# Patient Record
Sex: Female | Born: 1980
Health system: Southern US, Community
[De-identification: ages and names within clinical notes are randomized; demographics above are authoritative.]

## PROBLEM LIST (undated history)

## (undated) DIAGNOSIS — F419 Anxiety disorder, unspecified: Secondary | ICD-10-CM

## (undated) DIAGNOSIS — M545 Low back pain: Secondary | ICD-10-CM

## (undated) DIAGNOSIS — D649 Anemia, unspecified: Secondary | ICD-10-CM

## (undated) DIAGNOSIS — F339 Major depressive disorder, recurrent, unspecified: Secondary | ICD-10-CM

## (undated) DIAGNOSIS — G8929 Other chronic pain: Secondary | ICD-10-CM

## (undated) DIAGNOSIS — G2581 Restless legs syndrome: Secondary | ICD-10-CM

## (undated) DIAGNOSIS — K582 Mixed irritable bowel syndrome: Secondary | ICD-10-CM

## (undated) DIAGNOSIS — I7300 Raynaud's syndrome without gangrene: Secondary | ICD-10-CM

## (undated) DIAGNOSIS — G5621 Lesion of ulnar nerve, right upper limb: Secondary | ICD-10-CM

## (undated) DIAGNOSIS — R011 Cardiac murmur, unspecified: Secondary | ICD-10-CM

## (undated) HISTORY — DX: Other chronic pain: G89.29

## (undated) HISTORY — DX: Restless legs syndrome: G25.81

## (undated) HISTORY — DX: Low back pain: M54.5

## (undated) HISTORY — DX: Mixed irritable bowel syndrome: K58.2

## (undated) HISTORY — PX: TUBAL LIGATION: SHX77

## (undated) HISTORY — DX: Major depressive disorder, recurrent, unspecified: F33.9

## (undated) HISTORY — DX: Raynaud's syndrome without gangrene: I73.00

## (undated) HISTORY — DX: Anxiety disorder, unspecified: F41.9

## (undated) HISTORY — DX: Lesion of ulnar nerve, right upper limb: G56.21

## (undated) HISTORY — DX: Anemia, unspecified: D64.9

## (undated) HISTORY — DX: Cardiac murmur, unspecified: R01.1

---

## 2015-11-21 DIAGNOSIS — L700 Acne vulgaris: Secondary | ICD-10-CM | POA: Insufficient documentation

## 2016-02-16 ENCOUNTER — Ambulatory Visit (INDEPENDENT_AMBULATORY_CARE_PROVIDER_SITE_OTHER): Payer: 59 | Admitting: Physician Assistant

## 2016-02-16 ENCOUNTER — Ambulatory Visit (INDEPENDENT_AMBULATORY_CARE_PROVIDER_SITE_OTHER): Payer: 59

## 2016-02-16 ENCOUNTER — Encounter: Payer: Self-pay | Admitting: Physician Assistant

## 2016-02-16 VITALS — BP 99/80 | HR 83 | Ht 64.25 in | Wt 152.0 lb

## 2016-02-16 DIAGNOSIS — M25561 Pain in right knee: Secondary | ICD-10-CM

## 2016-02-16 DIAGNOSIS — M791 Myalgia: Secondary | ICD-10-CM

## 2016-02-16 DIAGNOSIS — M79605 Pain in left leg: Secondary | ICD-10-CM

## 2016-02-16 DIAGNOSIS — R5383 Other fatigue: Secondary | ICD-10-CM

## 2016-02-16 DIAGNOSIS — M79604 Pain in right leg: Secondary | ICD-10-CM | POA: Diagnosis not present

## 2016-02-16 DIAGNOSIS — G8929 Other chronic pain: Secondary | ICD-10-CM

## 2016-02-16 DIAGNOSIS — IMO0001 Reserved for inherently not codable concepts without codable children: Secondary | ICD-10-CM

## 2016-02-16 DIAGNOSIS — M609 Myositis, unspecified: Secondary | ICD-10-CM

## 2016-02-20 ENCOUNTER — Encounter: Payer: Self-pay | Admitting: Physician Assistant

## 2016-02-20 DIAGNOSIS — M79605 Pain in left leg: Secondary | ICD-10-CM

## 2016-02-20 DIAGNOSIS — R5383 Other fatigue: Secondary | ICD-10-CM | POA: Diagnosis not present

## 2016-02-20 DIAGNOSIS — IMO0001 Reserved for inherently not codable concepts without codable children: Secondary | ICD-10-CM | POA: Insufficient documentation

## 2016-02-20 DIAGNOSIS — M609 Myositis, unspecified: Secondary | ICD-10-CM | POA: Diagnosis not present

## 2016-02-20 DIAGNOSIS — G8929 Other chronic pain: Secondary | ICD-10-CM | POA: Insufficient documentation

## 2016-02-20 DIAGNOSIS — M79604 Pain in right leg: Secondary | ICD-10-CM | POA: Insufficient documentation

## 2016-02-20 DIAGNOSIS — M25561 Pain in right knee: Secondary | ICD-10-CM | POA: Insufficient documentation

## 2016-02-20 DIAGNOSIS — M791 Myalgia: Secondary | ICD-10-CM | POA: Diagnosis not present

## 2016-02-20 LAB — CBC
HCT: 39.4 % (ref 35.0–45.0)
Hemoglobin: 12.5 g/dL (ref 11.7–15.5)
MCH: 29.1 pg (ref 27.0–33.0)
MCHC: 31.7 g/dL — ABNORMAL LOW (ref 32.0–36.0)
MCV: 91.6 fL (ref 80.0–100.0)
MPV: 10.5 fL (ref 7.5–12.5)
PLATELETS: 238 10*3/uL (ref 140–400)
RBC: 4.3 MIL/uL (ref 3.80–5.10)
RDW: 13.6 % (ref 11.0–15.0)
WBC: 6.3 10*3/uL (ref 3.8–10.8)

## 2016-02-20 NOTE — Progress Notes (Signed)
   Subjective:    Patient ID: Teresa BorneMichelle Garcia, female    DOB: 1981/02/18, 35 y.o.   MRN: [id number]  HPI Patient is a 35 year old female who presents to the clinic to establish care.  Patient does have a past medical history positive for lethargy and bilateral lower extremity pain. Per patient she has had a workup done before and everything was negative. She has good and bad days. Some days she has little to no pain and other days her legs ache from the bilateral hips down. Some days she feels like her muscles are shearing part. She does feel like she has more pain over the right knee. This inhibits her from exercising regularly. She's not tried anything to help this pain she does not wear a knee brace. Does seem to be worse when she is walking up steps and attempting to run or jog. At times she feels very stiff. Her mother has fibromyalgia and she is concerned she could have this also. She admits she does always feels like she has no energy. She sleeps fairly well at night.  .. Social History   Social History  . Marital status: Significant Other    Spouse name: N/A  . Number of children: N/A  . Years of education: N/A   Occupational History  . Not on file.   Social History Main Topics  . Smoking status: Former Games developermoker  . Smokeless tobacco: Never Used  . Alcohol use Yes  . Drug use: No  . Sexual activity: Yes   Other Topics Concern  . Not on file   Social History Narrative  . No narrative on file      Review of Systems  All other systems reviewed and are negative.      Objective:   Physical Exam  Constitutional: She is oriented to person, place, and time. She appears well-developed and well-nourished.  HENT:  Head: Normocephalic and atraumatic.  Cardiovascular: Normal rate, regular rhythm and normal heart sounds.   Pulmonary/Chest: Effort normal and breath sounds normal.  Musculoskeletal:  NROM of right knee.  5/5 strength of bilateral lower extremities.  No joint  tenderness.  Negative anterior drawer sign.   Neurological: She is alert and oriented to person, place, and time.  Psychiatric: She has a normal mood and affect. Her behavior is normal.          Assessment & Plan:  No energy/bilateral leg pain/myalgia- unclear etiology. Has been suspected she has fibromyalgia. Her mother has been diagnosed with fibromyalgia. She was started on Cymbalta in the past and she did not feel like it helped and seem to make symptoms worse. She denies any ongoing depression or anxiety.   Right knee pain- will get xray to evaluate. Xray was essentially normal. Did appear to have a bone spur over posterior patella. 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 HO of work outs given. Discussed ibuprofen and PT. She will consider. Work out with sleeve. If no improvement see sports med or consider MRI.

## 2016-02-21 ENCOUNTER — Ambulatory Visit: Payer: 59 | Admitting: Family Medicine

## 2016-02-21 ENCOUNTER — Encounter: Payer: Self-pay | Admitting: Physician Assistant

## 2016-02-21 DIAGNOSIS — R790 Abnormal level of blood mineral: Secondary | ICD-10-CM | POA: Insufficient documentation

## 2016-02-21 LAB — COMPREHENSIVE METABOLIC PANEL
ALK PHOS: 38 U/L (ref 33–115)
ALT: 14 U/L (ref 6–29)
AST: 16 U/L (ref 10–30)
Albumin: 4.1 g/dL (ref 3.6–5.1)
BILIRUBIN TOTAL: 0.6 mg/dL (ref 0.2–1.2)
BUN: 12 mg/dL (ref 7–25)
CHLORIDE: 106 mmol/L (ref 98–110)
CO2: 25 mmol/L (ref 20–31)
CREATININE: 1.01 mg/dL (ref 0.50–1.10)
Calcium: 8.6 mg/dL (ref 8.6–10.2)
Glucose, Bld: 84 mg/dL (ref 65–99)
Potassium: 4 mmol/L (ref 3.5–5.3)
SODIUM: 139 mmol/L (ref 135–146)
TOTAL PROTEIN: 7.2 g/dL (ref 6.1–8.1)

## 2016-02-21 LAB — FERRITIN: Ferritin: 15 ng/mL (ref 10–154)

## 2016-02-21 LAB — ANA: Anti Nuclear Antibody(ANA): NEGATIVE

## 2016-02-21 LAB — TSH: TSH: 1.47 mIU/L

## 2016-02-21 LAB — VITAMIN B12: VITAMIN B 12: 702 pg/mL (ref 200–1100)

## 2016-02-21 LAB — VITAMIN D 25 HYDROXY (VIT D DEFICIENCY, FRACTURES): Vit D, 25-Hydroxy: 32 ng/mL (ref 30–100)

## 2016-02-21 LAB — SEDIMENTATION RATE: Sed Rate: 6 mm/hr (ref 0–20)

## 2016-02-21 LAB — C-REACTIVE PROTEIN

## 2016-03-13 ENCOUNTER — Ambulatory Visit (INDEPENDENT_AMBULATORY_CARE_PROVIDER_SITE_OTHER): Payer: 59 | Admitting: Physician Assistant

## 2016-03-13 VITALS — BP 104/64 | HR 79 | Wt 157.0 lb

## 2016-03-13 DIAGNOSIS — R635 Abnormal weight gain: Secondary | ICD-10-CM | POA: Diagnosis not present

## 2016-03-13 MED ORDER — PHENTERMINE HCL 37.5 MG PO CAPS: 37.5000 mg | ORAL_CAPSULE | ORAL | 0 refills | Status: DC

## 2016-03-13 NOTE — Progress Notes (Signed)
Patient came into clinic today for initial weight check. Pt would like to begin on Phentermine. Vitals recorded and will route to PCP for approval on Rx. Pharmacy correct in chart.    Discussed with patient in office 150 minutes of exercise a week and calorie limitation to 1500 a day. Discussed side effects of medication. Follow up in one month. Tandy GawJade Breeback PA-C

## 2016-03-18 ENCOUNTER — Encounter: Payer: 59 | Admitting: Physician Assistant

## 2016-03-22 ENCOUNTER — Ambulatory Visit (INDEPENDENT_AMBULATORY_CARE_PROVIDER_SITE_OTHER): Payer: 59 | Admitting: Physician Assistant

## 2016-03-22 ENCOUNTER — Encounter: Payer: Self-pay | Admitting: Physician Assistant

## 2016-03-22 VITALS — BP 119/73 | HR 94 | Ht 64.25 in | Wt 151.0 lb

## 2016-03-22 DIAGNOSIS — Z0000 Encounter for general adult medical examination without abnormal findings: Secondary | ICD-10-CM | POA: Diagnosis not present

## 2016-03-22 DIAGNOSIS — Z720 Tobacco use: Secondary | ICD-10-CM

## 2016-03-22 DIAGNOSIS — Z716 Tobacco abuse counseling: Secondary | ICD-10-CM | POA: Diagnosis not present

## 2016-03-22 DIAGNOSIS — Z131 Encounter for screening for diabetes mellitus: Secondary | ICD-10-CM | POA: Diagnosis not present

## 2016-03-22 DIAGNOSIS — R635 Abnormal weight gain: Secondary | ICD-10-CM

## 2016-03-22 MED ORDER — PHENTERMINE HCL 37.5 MG PO TABS
37.5000 mg | ORAL_TABLET | Freq: Every day | ORAL | 0 refills | Status: DC
Start: 2016-03-22 — End: 2016-04-25

## 2016-03-22 MED ORDER — BUPROPION HCL ER (SR) 150 MG PO TB12: 150.0000 mg | ORAL_TABLET | Freq: Two times a day (BID) | ORAL | 1 refills | Status: DC

## 2016-03-22 MED FILL — BUPROPION HCL SR 150 MG TAB: 150 | 30 days supply | Qty: 60 | Fill #0

## 2016-03-22 MED FILL — PHENTERMINE 37.5 MG TABLET: 37.5 | 30 days supply | Qty: 30 | Fill #0

## 2016-03-22 NOTE — Patient Instructions (Signed)

## 2016-03-24 NOTE — Progress Notes (Signed)
Subjective:     Teresa Garcia is a 35 y.o. female and is here for a comprehensive physical exam. The patient reports problems - she started phentermine but does not feel like it is helping curve her appetitie like it should. she is walking a few times a week. down 7lbs. .  Social History   Social History  . Marital status: Significant Other    Spouse name: N/A  . Number of children: N/A  . Years of education: N/A   Occupational History  . Not on file.   Social History Main Topics  . Smoking status: Current Some Day Smoker  . Smokeless tobacco: Never Used  . Alcohol use Yes  . Drug use: No  . Sexual activity: Yes   Other Topics Concern  . Not on file   Social History Narrative  . No narrative on file   Health Maintenance  Topic Date Due  . HIV Screening  01/03/1997  . TETANUS/TDAP  01/03/2001  . PAP SMEAR  01/04/2003  . INFLUENZA VACCINE  02/15/2017 (Originally 01/16/2016)    The following portions of the patient's history were reviewed and updated as appropriate: allergies, current medications, past family history, past medical history, past social history, past surgical history and problem list.  Review of Systems Pertinent items noted in HPI and remainder of comprehensive ROS otherwise negative.   Objective:    BP 119/73   Pulse 94   Ht 5' 4.25" (1.632 m)   Wt 151 lb (68.5 kg)   BMI 25.72 kg/m  General appearance: alert, cooperative and appears stated age Head: Normocephalic, without obvious abnormality, atraumatic Eyes: conjunctivae/corneas clear. PERRL, EOM's intact. Fundi benign. Ears: normal TM's and external ear canals both ears Nose: Nares normal. Septum midline. Mucosa normal. No drainage or sinus tenderness. Throat: lips, mucosa, and tongue normal; teeth and gums normal Neck: no adenopathy, no carotid bruit, no JVD, supple, symmetrical, trachea midline and thyroid not enlarged, symmetric, no tenderness/mass/nodules Back: symmetric, no curvature. ROM  normal. No CVA tenderness. Lungs: clear to auscultation bilaterally Heart: regular rate and rhythm, S1, S2 normal, no murmur, click, rub or gallop Abdomen: soft, non-tender; bowel sounds normal; no masses,  no organomegaly Extremities: extremities normal, atraumatic, no cyanosis or edema Pulses: 2+ and symmetric Skin: Skin color, texture, turgor normal. No rashes or lesions Lymph nodes: Cervical, supraclavicular, and axillary nodes normal. Neurologic: Alert and oriented X 3, normal strength and tone. Normal symmetric reflexes. Normal coordination and gait    Assessment:    Healthy female exam.      Plan:    Marland Kitchen.Marland Kitchen.Teresa Garcia was seen today for annual exam.  Diagnoses and all orders for this visit:  Routine physical examination  Screening for diabetes mellitus -     Lipid panel  Abnormal weight gain  Other orders -     phentermine (ADIPEX-P) 37.5 MG tablet; Take 1 tablet (37.5 mg total) by mouth daily before breakfast. -     buPROPion (WELLBUTRIN SR) 150 MG 12 hr tablet; Take 1 tablet (150 mg total) by mouth 2 (two) times daily.  CPE- encouraged vitamin d 800 units and calcium 1500mg  daily.  Switched from capsule to tablet.    Smoking cessation- Wellbutrin started.   See After Visit Summary for Counseling Recommendations

## 2016-03-29 ENCOUNTER — Ambulatory Visit (INDEPENDENT_AMBULATORY_CARE_PROVIDER_SITE_OTHER): Payer: 59 | Admitting: Sports Medicine

## 2016-03-29 ENCOUNTER — Ambulatory Visit (INDEPENDENT_AMBULATORY_CARE_PROVIDER_SITE_OTHER): Payer: 59

## 2016-03-29 DIAGNOSIS — R51 Headache: Secondary | ICD-10-CM | POA: Diagnosis not present

## 2016-03-29 DIAGNOSIS — M62838 Other muscle spasm: Secondary | ICD-10-CM | POA: Diagnosis not present

## 2016-03-29 DIAGNOSIS — M542 Cervicalgia: Secondary | ICD-10-CM | POA: Diagnosis not present

## 2016-03-29 MED ORDER — CYCLOBENZAPRINE HCL 10 MG PO TABS: ORAL_TABLET | ORAL | 0 refills | Status: DC

## 2016-03-29 MED ORDER — MELOXICAM 15 MG PO TABS: ORAL_TABLET | ORAL | 3 refills | Status: DC

## 2016-03-29 MED ORDER — PREDNISONE 50 MG PO TABS: ORAL_TABLET | ORAL | 0 refills | Status: DC

## 2016-03-29 MED FILL — predniSONE 50 MG TABS: 50 | 5 days supply | Qty: 5 | Fill #0

## 2016-03-29 MED FILL — CYCLOBENZAPRINE 10 MG TAB: 10 | 10 days supply | Qty: 30 | Fill #0

## 2016-03-29 MED FILL — MELOXICAM 15 MG TABLET: 15 | 30 days supply | Qty: 30 | Fill #0

## 2016-03-29 NOTE — Assessment & Plan Note (Signed)
Meloxicam, prednisone, Flexeril at bedtime. New set of x-rays, rehabilitation exercises given. Return to see me in one month.

## 2016-03-29 NOTE — Progress Notes (Signed)
   Subjective:    I'm seeing this patient as a consultation for:  Teresa GawJade Breeback, PA-C  CC: Neck pain  HPI: This is a pleasant [age] year old female, for years she's had on and off neck pain, initially localized to the left paracervical muscles with an x-ray that showed loss of the normal cervical lordosis. Initially it did go down her left arm. This resolved, unfortunately she is having a recurrence of pain, localized at the base of the neck with radiation over to the top of the head. No overt radicular symptoms down the arm, no weakness, no trauma, constitutional symptoms.  Past medical history:  Negative.  See flowsheet/record as well for more information.  Surgical history: Negative.  See flowsheet/record as well for more information.  Family history: Negative.  See flowsheet/record as well for more information.  Social history: Negative.  See flowsheet/record as well for more information.  Allergies, and medications have been entered into the medical record, reviewed, and no changes needed.   Review of Systems: No headache, visual changes, nausea, vomiting, diarrhea, constipation, dizziness, abdominal pain, skin rash, fevers, chills, night sweats, weight loss, swollen lymph nodes, body aches, joint swelling, muscle aches, chest pain, shortness of breath, mood changes, visual or auditory hallucinations.   Objective:   General: Well Developed, well nourished, and in no acute distress.  Neuro/Psych: Alert and oriented x3, extra-ocular muscles intact, able to move all 4 extremities, sensation grossly intact. Skin: Warm and dry, no rashes noted.  Respiratory: Not using accessory muscles, speaking in full sentences, trachea midline.  Cardiovascular: Pulses palpable, no extremity edema. Abdomen: Does not appear distended. Neck: Negative spurling's Full neck range of motion Grip strength and sensation normal in bilateral hands Strength good C4 to T1 distribution No sensory change to C4 to  T1 Reflexes normal  Impression and Recommendations:   This case required medical decision making of moderate complexity.  Cervical paraspinal muscle spasm Meloxicam, prednisone, Flexeril at bedtime. New set of x-rays, rehabilitation exercises given. Return to see me in one month.

## 2016-04-11 ENCOUNTER — Other Ambulatory Visit: Payer: Self-pay | Admitting: *Deleted

## 2016-04-25 ENCOUNTER — Ambulatory Visit (INDEPENDENT_AMBULATORY_CARE_PROVIDER_SITE_OTHER): Payer: 59 | Admitting: Family Medicine

## 2016-04-25 VITALS — BP 117/68 | HR 92 | Ht 64.25 in | Wt 147.0 lb

## 2016-04-25 DIAGNOSIS — R635 Abnormal weight gain: Secondary | ICD-10-CM

## 2016-04-25 MED ORDER — PHENTERMINE HCL 37.5 MG PO TABS: 37.5000 mg | ORAL_TABLET | Freq: Every day | ORAL | 0 refills | Status: DC

## 2016-04-25 MED FILL — PHENTERMINE 37.5 MG TABLET: 37.5 | 30 days supply | Qty: 30 | Fill #0

## 2016-04-25 NOTE — Progress Notes (Signed)
Fantastic! She ahs been going to exercise classes.   Will refill medication. Follow-up in one month. She may need to start tapering after next month as she will likely be at normal BMI.  Nani Gasseratherine Metheney, MD

## 2016-04-25 NOTE — Progress Notes (Signed)
Weight and BP checked today.  Pt denies chest pain, palpitations, or trouble sleeping.  She's lost 4lbs per her last visit with Jade.

## 2016-05-02 ENCOUNTER — Ambulatory Visit (INDEPENDENT_AMBULATORY_CARE_PROVIDER_SITE_OTHER): Payer: 59 | Admitting: Obstetrics & Gynecology

## 2016-05-02 ENCOUNTER — Encounter: Payer: Self-pay | Admitting: Obstetrics & Gynecology

## 2016-05-02 VITALS — BP 116/82 | HR 96 | Ht 64.0 in | Wt 144.0 lb

## 2016-05-02 DIAGNOSIS — Z1151 Encounter for screening for human papillomavirus (HPV): Secondary | ICD-10-CM | POA: Diagnosis not present

## 2016-05-02 DIAGNOSIS — Z113 Encounter for screening for infections with a predominantly sexual mode of transmission: Secondary | ICD-10-CM | POA: Diagnosis not present

## 2016-05-02 DIAGNOSIS — Z124 Encounter for screening for malignant neoplasm of cervix: Secondary | ICD-10-CM

## 2016-05-02 DIAGNOSIS — Z01419 Encounter for gynecological examination (general) (routine) without abnormal findings: Secondary | ICD-10-CM | POA: Diagnosis not present

## 2016-05-02 DIAGNOSIS — N898 Other specified noninflammatory disorders of vagina: Secondary | ICD-10-CM

## 2016-05-02 NOTE — Progress Notes (Signed)
Subjective:    Teresa BorneMichelle Garcia is a 35 y.o. DW P3 (15, 3313, and 788 yo kids)  female who presents for an annual exam. The patient has no complaints today. She thinks she is having a recurrence of BV.  She tried Monistat 1.  The patient is sexually active. GYN screening history: last pap: was normal. The patient wears seatbelts: yes. The patient participates in regular exercise: yes. Has the patient ever been transfused or tattooed?: yes. The patient reports that there is not domestic violence in her life.   Menstrual History: OB History    Gravida Para Term Preterm AB Living   3 3 3     3    SAB TAB Ectopic Multiple Live Births                  Menarche age: [age] Patient's last menstrual period was 04/22/2016.    The following portions of the patient's history were reviewed and updated as appropriate: allergies, current medications, past family history, past medical history, past social history, past surgical history and problem list.  Review of Systems Pertinent items are noted in HPI.   Works for [employer] Has had flu vaccine Monogamous for about 18 months FH- no breast/gyn/colon cancer Didn't get Gardasil Had a BTL  Periods are regular, Some PCB at times Objective:    BP 116/82   Pulse 96   Ht 5\' 4"  (1.626 m)   Wt 144 lb (65.3 kg)   LMP 04/22/2016   BMI 24.72 kg/m   General Appearance:    Alert, cooperative, no distress, appears stated age  Head:    Normocephalic, without obvious abnormality, atraumatic  Eyes:    PERRL, conjunctiva/corneas clear, EOM's intact, fundi    benign, both eyes  Ears:    Normal TM's and external ear canals, both ears  Nose:   Nares normal, septum midline, mucosa normal, no drainage    or sinus tenderness  Throat:   Lips, mucosa, and tongue normal; teeth and gums normal  Neck:   Supple, symmetrical, trachea midline, no adenopathy;    thyroid:  no enlargement/tenderness/nodules; no carotid   bruit or JVD  Back:     Symmetric, no curvature, ROM normal, no  CVA tenderness  Lungs:     Clear to auscultation bilaterally, respirations unlabored  Chest Wall:    No tenderness or deformity   Heart:    Regular rate and rhythm, S1 and S2 normal, no murmur, rub   or gallop  Breast Exam:    No tenderness, masses, or nipple abnormality  Abdomen:     Soft, non-tender, bowel sounds active all four quadrants,    no masses, no organomegaly  Genitalia:    Normal female without lesion, discharge or tenderness, small amount of menstrual blood but otherwise absolutely no discharge or odor, NSSmidplane, NT, normal adnexal exam     Extremities:   Extremities normal, atraumatic, no cyanosis or edema  Pulses:   2+ and symmetric all extremities  Skin:   Skin color, texture, turgor normal, no rashes or lesions  Lymph nodes:   Cervical, supraclavicular, and axillary nodes normal  Neurologic:   CNII-XII intact, normal strength, sensation and reflexes    throughout   .    Assessment:    Healthy female exam.   Vaginal discharge   Plan:     Thin prep Pap smear. with cotesting STI testing Wet prep sent

## 2016-05-02 NOTE — Addendum Note (Signed)
Addended by: Granville LewisLARK, Ferd Horrigan L on: 05/02/2016 03:44 PM   Modules accepted: Orders

## 2016-05-03 ENCOUNTER — Telehealth: Payer: Self-pay

## 2016-05-03 LAB — RPR

## 2016-05-03 LAB — WET PREP FOR TRICH, YEAST, CLUE
Trich, Wet Prep: NONE SEEN
Yeast Wet Prep HPF POC: NONE SEEN

## 2016-05-03 LAB — HEPATITIS B SURFACE ANTIGEN: HEP B S AG: NEGATIVE

## 2016-05-03 LAB — HEPATITIS C ANTIBODY: HCV AB: NEGATIVE

## 2016-05-03 LAB — HIV ANTIBODY (ROUTINE TESTING W REFLEX): HIV: NONREACTIVE

## 2016-05-03 MED ORDER — METRONIDAZOLE 500 MG PO TABS: 500.0000 mg | ORAL_TABLET | Freq: Two times a day (BID) | ORAL | 0 refills | Status: DC

## 2016-05-03 NOTE — Telephone Encounter (Signed)
Spoke with pt and she is aware that she has BV and medication has been sent to her pharmacy

## 2016-05-06 LAB — GC/CHLAMYDIA PROBE AMP (~~LOC~~) NOT AT ARMC
Chlamydia: NEGATIVE
NEISSERIA GONORRHEA: NEGATIVE

## 2016-05-07 LAB — CYTOLOGY - PAP
Diagnosis: NEGATIVE
HPV: DETECTED — AB

## 2016-05-14 ENCOUNTER — Telehealth: Payer: Self-pay | Admitting: *Deleted

## 2016-05-14 NOTE — Telephone Encounter (Signed)
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

## 2016-05-17 ENCOUNTER — Telehealth: Payer: Self-pay | Admitting: *Deleted

## 2016-05-17 DIAGNOSIS — B379 Candidiasis, unspecified: Secondary | ICD-10-CM

## 2016-05-17 MED ORDER — FLUCONAZOLE 150 MG PO TABS: ORAL_TABLET | ORAL | 0 refills | Status: DC

## 2016-05-17 NOTE — Telephone Encounter (Signed)
Pt called stating that she just finished her Flagyl and now has d/c and vaginal itching.  Requesting Diflucan be sent to her pharmacy.  Per Dr Marice Potterove OK to send Diflucan

## 2016-06-03 ENCOUNTER — Other Ambulatory Visit: Payer: Self-pay | Admitting: Physician Assistant

## 2016-06-03 DIAGNOSIS — L700 Acne vulgaris: Secondary | ICD-10-CM

## 2016-06-03 NOTE — Progress Notes (Signed)
Pt has struggle with acne for many years. She has seen dermatology before. She would like a new referral to consider accutane.

## 2016-06-06 ENCOUNTER — Ambulatory Visit (INDEPENDENT_AMBULATORY_CARE_PROVIDER_SITE_OTHER): Payer: 59 | Admitting: Sports Medicine

## 2016-06-06 VITALS — BP 109/71 | HR 90 | Ht 64.25 in | Wt 137.0 lb

## 2016-06-06 DIAGNOSIS — R635 Abnormal weight gain: Secondary | ICD-10-CM

## 2016-06-06 MED ORDER — PHENTERMINE HCL 37.5 MG PO TABS: ORAL_TABLET | ORAL | 0 refills | Status: DC

## 2016-06-06 MED FILL — PHENTERMINE 37.5 MG TABLET: 37.5 | 30 days supply | Qty: 30 | Fill #0

## 2016-06-06 NOTE — Patient Instructions (Signed)
Schedule an appointment to discuss daytime sleepiness.

## 2016-06-06 NOTE — Assessment & Plan Note (Signed)
Has finished 3 months of phentermine, 20 pounds down, we will do another month of full dose phentermine and I think afterwards began start to taper her off with a half dose for another month or 2. Return in one month for a weight check.

## 2016-06-06 NOTE — Progress Notes (Signed)
   Subjective:    Patient ID: Teresa Garcia, female    DOB: 02/12/1981, 35 y.o.   MRN: [id number]  HPI  Teresa BorneMichelle Quam is here for blood pressure and weight check. Diet and exercise is going well. Denies trouble sleeping or palpitations.   Review of Systems     Objective:   Physical Exam        Assessment & Plan:  Abnormal weight gain - Patient has lost 10 pounds since last visit.

## 2016-06-19 ENCOUNTER — Telehealth: Payer: Self-pay | Admitting: *Deleted

## 2016-06-19 DIAGNOSIS — B9689 Other specified bacterial agents as the cause of diseases classified elsewhere: Secondary | ICD-10-CM

## 2016-06-19 DIAGNOSIS — N760 Acute vaginitis: Principal | ICD-10-CM

## 2016-06-19 MED ORDER — METRONIDAZOLE 500 MG PO TABS: ORAL_TABLET | ORAL | 0 refills | Status: DC

## 2016-06-19 NOTE — Telephone Encounter (Signed)
Pt called requesting a RF on flagyl.  Pt does work in [employer] and 1 RF given but explain that if it doesn't clear would need to see her in office

## 2016-07-03 ENCOUNTER — Ambulatory Visit: Payer: 59 | Admitting: Physician Assistant

## 2016-07-15 ENCOUNTER — Encounter: Payer: Self-pay | Admitting: Physician Assistant

## 2016-07-15 ENCOUNTER — Ambulatory Visit (INDEPENDENT_AMBULATORY_CARE_PROVIDER_SITE_OTHER): Payer: 59 | Admitting: Physician Assistant

## 2016-07-15 VITALS — BP 104/75 | HR 76 | Wt 136.0 lb

## 2016-07-15 DIAGNOSIS — R635 Abnormal weight gain: Secondary | ICD-10-CM | POA: Diagnosis not present

## 2016-07-15 DIAGNOSIS — R41840 Attention and concentration deficit: Secondary | ICD-10-CM | POA: Diagnosis not present

## 2016-07-15 MED ORDER — PHENTERMINE HCL 37.5 MG PO TABS: ORAL_TABLET | ORAL | 0 refills | Status: DC

## 2016-07-15 MED FILL — PHENTERMINE 37.5 MG TABLET: 37.5 | 30 days supply | Qty: 30 | Fill #0

## 2016-07-15 NOTE — Progress Notes (Addendum)
   Subjective:    Patient ID: Teresa Garcia, female    DOB: 05-02-1981, 36 y.o.   MRN: [id number]  HPI  Patient is a 36yo female who presents to the clinic for a weight check and follow-up on Phentermine.  Patient states she is tolerating the medication with no side effects.  Patient denied palpitations, chest pain, headaches, or shortness of breath.  Patient states she feels the phentermine has improved her level of energy considerably.  She states when she does not take it she has to take several naps per day, but when she is on it she is more productive and experiences less fatigue.  Patient states she takes daily tumeric and vitamin D for her fatigue and body aches.  Patient is concerned about her level of focus off phentermine since it has helped with her productivity.       Review of Systems  All other systems reviewed and are negative.      Objective:   Physical Exam  Constitutional: She is oriented to person, place, and time. She appears well-developed and well-nourished.  HENT:  Head: Normocephalic and atraumatic.  Cardiovascular: Normal rate, regular rhythm and normal heart sounds.   Pulmonary/Chest: Effort normal and breath sounds normal.  Neurological: She is alert and oriented to person, place, and time.  Skin: Skin is warm and dry.  Psychiatric: She has a normal mood and affect. Her behavior is normal.  Nursing note and vitals reviewed.         Assessment & Plan:  Marland Kitchen.Marland Kitchen.[name] was seen today for bp and wt check.  Diagnoses and all orders for this visit:  Abnormal weight gain -     phentermine (ADIPEX-P) 37.5 MG tablet; One tab by mouth qAM   Patient given phentermine 37.5mg , and she was instructed to take half a tablet daily for the next month to wane herself off of it.  Her BMI is currently 23.  Encouraged patient to continue dietary habits and exercise regimen.   It is uncertain if her focus issues are due to ADHD, so discussed with patient the possibility of  getting tested for ADHD once she is off phentermine.      Follow-up once discontinue phentermine if feels she needs formal ADHD testing.

## 2016-07-17 DIAGNOSIS — M791 Myalgia: Secondary | ICD-10-CM | POA: Diagnosis not present

## 2016-07-17 DIAGNOSIS — M9901 Segmental and somatic dysfunction of cervical region: Secondary | ICD-10-CM | POA: Diagnosis not present

## 2016-07-17 DIAGNOSIS — M5413 Radiculopathy, cervicothoracic region: Secondary | ICD-10-CM | POA: Diagnosis not present

## 2016-07-17 DIAGNOSIS — M9902 Segmental and somatic dysfunction of thoracic region: Secondary | ICD-10-CM | POA: Diagnosis not present

## 2016-07-29 DIAGNOSIS — M5413 Radiculopathy, cervicothoracic region: Secondary | ICD-10-CM | POA: Diagnosis not present

## 2016-07-29 DIAGNOSIS — M9901 Segmental and somatic dysfunction of cervical region: Secondary | ICD-10-CM | POA: Diagnosis not present

## 2016-07-29 DIAGNOSIS — M791 Myalgia: Secondary | ICD-10-CM | POA: Diagnosis not present

## 2016-07-29 DIAGNOSIS — M9902 Segmental and somatic dysfunction of thoracic region: Secondary | ICD-10-CM | POA: Diagnosis not present

## 2016-08-01 ENCOUNTER — Ambulatory Visit (INDEPENDENT_AMBULATORY_CARE_PROVIDER_SITE_OTHER): Payer: 59 | Admitting: Physician Assistant

## 2016-08-01 VITALS — BP 112/77 | HR 94 | Temp 98.5°F | Wt 133.0 lb

## 2016-08-01 DIAGNOSIS — J019 Acute sinusitis, unspecified: Secondary | ICD-10-CM | POA: Diagnosis not present

## 2016-08-01 MED ORDER — IPRATROPIUM BROMIDE 0.06 % NA SOLN: 1.0000 | Freq: Four times a day (QID) | NASAL | 0 refills | Status: DC | PRN

## 2016-08-01 NOTE — Patient Instructions (Addendum)
PRESCRTIPTION ATROVENT - up to 4 times daily as directed on prescription bottle     SUDAFED PE (PHENYLEPHRINE) - 10 mg every 6 hours, maximum 60 mg per day   Sinusitis, Adult Sinusitis is soreness and inflammation of your sinuses. Sinuses are hollow spaces in the bones around your face. Your sinuses are located:  Around your eyes.  In the middle of your forehead.  Behind your nose.  In your cheekbones. Your sinuses and nasal passages are lined with a stringy fluid (mucus). Mucus normally drains out of your sinuses. When your nasal tissues become inflamed or swollen, the mucus can become trapped or blocked so air cannot flow through your sinuses. This allows bacteria, viruses, and funguses to grow, which leads to infection. Sinusitis can develop quickly and last for 7?10 days (acute) or for more than 12 weeks (chronic). Sinusitis often develops after a cold. What are the causes? This condition is caused by anything that creates swelling in the sinuses or stops mucus from draining, including:  Allergies.  Asthma.  Bacterial or viral infection.  Abnormally shaped bones between the nasal passages.  Nasal growths that contain mucus (nasal polyps).  Narrow sinus openings.  Pollutants, such as chemicals or irritants in the air.  A foreign object stuck in the nose.  A fungal infection. This is rare. What increases the risk? The following factors may make you more likely to develop this condition:  Having allergies or asthma.  Having had a recent cold or respiratory tract infection.  Having structural deformities or blockages in your nose or sinuses.  Having a weak immune system.  Doing a lot of swimming or diving.  Overusing nasal sprays.  Smoking. What are the signs or symptoms? The main symptoms of this condition are pain and a feeling of pressure around the affected sinuses. Other symptoms include:  Upper toothache.  Earache.  Headache.  Bad  breath.  Decreased sense of smell and taste.  A cough that may get worse at night.  Fatigue.  Fever.  Thick drainage from your nose. The drainage is often green and it may contain pus (purulent).  Stuffy nose or congestion.  Postnasal drip. This is when extra mucus collects in the throat or back of the nose.  Swelling and warmth over the affected sinuses.  Sore throat.  Sensitivity to light. How is this diagnosed? This condition is diagnosed based on symptoms, a medical history, and a physical exam. To find out if your condition is acute or chronic, your health care provider may:  Look in your nose for signs of nasal polyps.  Tap over the affected sinus to check for signs of infection.  View the inside of your sinuses using an imaging device that has a light attached (endoscope). If your health care provider suspects that you have chronic sinusitis, you may also:  Be tested for allergies.  Have a sample of mucus taken from your nose (nasal culture) and checked for bacteria.  Have a mucus sample examined to see if your sinusitis is related to an allergy. If your sinusitis does not respond to treatment and it lasts longer than 8 weeks, you may have an MRI or CT scan to check your sinuses. These scans also help to determine how severe your infection is. In rare cases, a bone biopsy may be done to rule out more serious types of fungal sinus disease. How is this treated? Treatment for sinusitis depends on the cause and whether your condition is chronic or acute.  If a virus is causing your sinusitis, your symptoms will go away on their own within 10 days. You may be given medicines to relieve your symptoms, including:  Topical nasal decongestants. They shrink swollen nasal passages and let mucus drain from your sinuses.  Antihistamines. These drugs block inflammation that is triggered by allergies. This can help to ease swelling in your nose and sinuses.  Topical nasal  corticosteroids. These are nasal sprays that ease inflammation and swelling in your nose and sinuses.  Nasal saline washes. These rinses can help to get rid of thick mucus in your nose. If your condition is caused by bacteria, you will be given an antibiotic medicine. If your condition is caused by a fungus, you will be given an antifungal medicine. Surgery may be needed to correct underlying conditions, such as narrow nasal passages. Surgery may also be needed to remove polyps. Follow these instructions at home: Medicines  Take, use, or apply over-the-counter and prescription medicines only as told by your health care provider. These may include nasal sprays.  If you were prescribed an antibiotic medicine, take it as told by your health care provider. Do not stop taking the antibiotic even if you start to feel better. Hydrate and Humidify  Drink enough water to keep your urine clear or pale yellow. Staying hydrated will help to thin your mucus.  Use a cool mist humidifier to keep the humidity level in your home above 50%.  Inhale steam for 10-15 minutes, 3-4 times a day or as told by your health care provider. You can do this in the bathroom while a hot shower is running.  Limit your exposure to cool or dry air. Rest  Rest as much as possible.  Sleep with your head raised (elevated).  Make sure to get enough sleep each night. General instructions  Apply a warm, moist washcloth to your face 3-4 times a day or as told by your health care provider. This will help with discomfort.  Wash your hands often with soap and water to reduce your exposure to viruses and other germs. If soap and water are not available, use hand sanitizer.  Do not smoke. Avoid being around people who are smoking (secondhand smoke).  Keep all follow-up visits as told by your health care provider. This is important. Contact a health care provider if:  You have a fever.  Your symptoms get worse.  Your  symptoms do not improve within 10 days. Get help right away if:  You have a severe headache.  You have persistent vomiting.  You have pain or swelling around your face or eyes.  You have vision problems.  You develop confusion.  Your neck is stiff.  You have trouble breathing. This information is not intended to replace advice given to you by your health care provider. Make sure you discuss any questions you have with your health care provider. Document Released: 06/03/2005 Document Revised: 01/28/2016 Document Reviewed: 03/29/2015 Elsevier Interactive Patient Education  2017 [employer].

## 2016-08-01 NOTE — Progress Notes (Signed)
HPI:                                                                Teresa Garcia is a 36 y.o. female who presents to [facility] [place]: Primary Care Sports Medicine today for cold symptoms  Complains of congestion and bilateral sinus pressure x 1 week. She also endorses post-nasal drainage at night. She states she feels like she is improving. Denies fevers, chills. Denies shortness of breath, wheezing. She has been taking OTC cold and flu medicine and Ibuprofen as needed. She has been vaping daily instead of smoking cigarettes.     No past medical history on file. Past Surgical History:  Procedure Laterality Date  . TUBAL LIGATION     Social History  Substance Use Topics  . Smoking status: Current Some Day Smoker    Packs/day: 0.25  . Smokeless tobacco: Never Used  . Alcohol use Yes     Comment: Socially   family history includes Diabetes in her maternal grandmother and paternal grandmother.  ROS: negative except as noted in the HPI  Medications: Current Outpatient Prescriptions  Medication Sig Dispense Refill  . cyclobenzaprine (FLEXERIL) 10 MG tablet One half tab PO qHS, then increase gradually to one tab TID. 30 tablet 0  . meloxicam (MOBIC) 15 MG tablet One tab PO qAM with breakfast for 2 weeks, then daily prn pain. 30 tablet 3  . phentermine (ADIPEX-P) 37.5 MG tablet One tab by mouth qAM 30 tablet 0   No current facility-administered medications for this visit.    Allergies  Allergen Reactions  . Wellbutrin [Bupropion] Other (See Comments)    Worsening of symptoms       Objective:  BP 112/77   Pulse 94   Temp 98.5 F (36.9 C) (Oral)   Wt 133 lb (60.3 kg)   BMI 22.65 kg/m  Gen: well-groomed, cooperative, not ill-appearing, no distress HEENT: normal conjunctiva, left canal occluded by cerumen, right TM clear, nasal mucosa edematous, oropharynx clear, moist mucus membranes,  frontal and maxillary sinus tenderness Pulm: Normal work of breathing,  normal phonation, clear to auscultation bilaterally, no wheezes, rales or rhonchi CV: Normal rate, regular rhythm, s1 and s2 distinct, no murmurs, clicks or rubs  Neuro: alert and oriented x 3, EOM's intact Lymph: no cervical or tonsillar adenopathy Skin: warm and dry, no rashes or lesions on exposed skin, no cyanosis   No results found for this or any previous visit (from the past 72 hour(s)). No results found.    Assessment and Plan: 36 y.o. female with   1. Acute non-recurrent sinusitis, unspecified location - symptomatic management with nasal spray and oral decongestant - oral hydration - smoking cessation - ipratropium (ATROVENT) 0.06 % nasal spray; Place 1 spray into both nostrils 4 (four) times daily as needed.  Dispense: 15 mL; Refill: 0   Patient education and anticipatory guidance given Patient agrees with treatment plan Follow-up as needed if symptoms worsen or fail to improve  Levonne Hubertharley E. Deshon Hsiao PA-C

## 2016-08-05 ENCOUNTER — Encounter: Payer: Self-pay | Admitting: Physician Assistant

## 2016-08-05 DIAGNOSIS — M791 Myalgia: Secondary | ICD-10-CM | POA: Diagnosis not present

## 2016-08-05 DIAGNOSIS — M9901 Segmental and somatic dysfunction of cervical region: Secondary | ICD-10-CM | POA: Diagnosis not present

## 2016-08-05 DIAGNOSIS — M9902 Segmental and somatic dysfunction of thoracic region: Secondary | ICD-10-CM | POA: Diagnosis not present

## 2016-08-05 DIAGNOSIS — J358 Other chronic diseases of tonsils and adenoids: Secondary | ICD-10-CM | POA: Insufficient documentation

## 2016-08-05 DIAGNOSIS — M5413 Radiculopathy, cervicothoracic region: Secondary | ICD-10-CM | POA: Diagnosis not present

## 2016-09-25 ENCOUNTER — Ambulatory Visit (INDEPENDENT_AMBULATORY_CARE_PROVIDER_SITE_OTHER): Payer: 59 | Admitting: Physician Assistant

## 2016-09-25 ENCOUNTER — Encounter: Payer: Self-pay | Admitting: Physician Assistant

## 2016-09-25 VITALS — BP 106/74 | HR 92 | Ht 64.25 in | Wt 133.0 lb

## 2016-09-25 DIAGNOSIS — R41840 Attention and concentration deficit: Secondary | ICD-10-CM | POA: Diagnosis not present

## 2016-09-25 DIAGNOSIS — R6889 Other general symptoms and signs: Secondary | ICD-10-CM | POA: Diagnosis not present

## 2016-09-25 DIAGNOSIS — F419 Anxiety disorder, unspecified: Secondary | ICD-10-CM | POA: Insufficient documentation

## 2016-09-25 NOTE — Progress Notes (Signed)
   Subjective:    Patient ID: Teresa Garcia, female    DOB: 07/02/1980, 36 y.o.   MRN: [id number]  HPI  Pt is a 36 yo female who presents to the clinic to discuss inattention, lack of focus, forgetfulness. She recently was on phentermine for weight loss. She noticed while she while she was taken she was much more focused. Her work and homelife improved. She is 133lbs and normal weight and we took her off and started to feel like her symptoms are worsening. She has never been tested for ADHD. She denies any depression. She does feel like she is anxious at times. When she makes mistakes it makes anxiety worse.   Review of Systems  All other systems reviewed and are negative.      Objective:   Physical Exam  Constitutional: She is oriented to person, place, and time. She appears well-developed and well-nourished.  HENT:  Head: Normocephalic and atraumatic.  Cardiovascular: Normal rate, regular rhythm and normal heart sounds.   Pulmonary/Chest: Effort normal and breath sounds normal.  Neurological: She is alert and oriented to person, place, and time.  Psychiatric: She has a normal mood and affect. Her behavior is normal.          Assessment & Plan:  Marland KitchenMarland KitchenDiagnoses and all orders for this visit:  Inattention -     Ambulatory referral to Psychology  Anxiety -     Ambulatory referral to Psychology  Forgetfulness -     Ambulatory referral to Psychology    .Marland Kitchen Depression screen [facility] 2/9 09/25/2016  Decreased Interest 0  Down, Depressed, Hopeless 0  PHQ - 2 Score 0  Altered sleeping 0  Tired, decreased energy 3  Change in appetite 0  Feeling bad or failure about yourself  0  Trouble concentrating 1  Moving slowly or fidgety/restless 0  Suicidal thoughts 0  PHQ-9 Score 4   .. GAD 7 : Generalized Anxiety Score 09/25/2016  Nervous, Anxious, on Edge 2  Control/stop worrying 0  Worry too much - different things 0  Trouble relaxing 1  Restless 1  Easily annoyed or irritable 1   Afraid - awful might happen 0  Total GAD 7 Score 5    I certainly feel like we could send for ADHD testing. I don't think her anxiety is causing symptoms.  See below ADHD questionnaire results. Part A 5/6. Part B 6/12. Positive screening.  Will follow up after testing.   Weight looks great. Way to go. Continue to maintain.

## 2016-10-16 ENCOUNTER — Ambulatory Visit: Payer: 59 | Admitting: Physician Assistant

## 2016-11-14 ENCOUNTER — Ambulatory Visit: Payer: 59 | Admitting: Osteopathic Medicine

## 2016-11-15 ENCOUNTER — Ambulatory Visit (INDEPENDENT_AMBULATORY_CARE_PROVIDER_SITE_OTHER): Payer: 59 | Admitting: Osteopathic Medicine

## 2016-11-15 ENCOUNTER — Encounter: Payer: Self-pay | Admitting: Osteopathic Medicine

## 2016-11-15 VITALS — BP 107/67 | HR 85 | Ht 64.0 in | Wt 137.0 lb

## 2016-11-15 DIAGNOSIS — M9901 Segmental and somatic dysfunction of cervical region: Secondary | ICD-10-CM

## 2016-11-15 DIAGNOSIS — M9902 Segmental and somatic dysfunction of thoracic region: Secondary | ICD-10-CM

## 2016-11-15 NOTE — Progress Notes (Signed)
HPI: Teresa Garcia is a 36 y.o. female  who [facility] [place] today, 11/15/16,  for chief complaint of:  Chief Complaint  Patient presents with  . Back Pain     . 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 Location: neck and upper thoracic spine . Quality: sore, tightness . Assoc signs/symptoms: no numbness/weakness    Past medical history, surgical history, social history and family history reviewed.  Patient Active Problem List   Diagnosis Date Noted  . Anxiety 09/25/2016  . Forgetfulness 09/25/2016  . Tonsil stone 08/05/2016  . Inattention 07/15/2016  . Abnormal weight gain 06/06/2016  . Cervical paraspinal muscle spasm 03/29/2016  . Decreased iron stores 02/21/2016  . Myalgia and myositis 02/20/2016  . Bilateral leg pain 02/20/2016  . No energy 02/20/2016  . Right knee pain 02/20/2016    Current medication list and allergy/intolerance information reviewed.   Current Outpatient Prescriptions on File Prior to Visit  Medication Sig Dispense Refill  . cyclobenzaprine (FLEXERIL) 10 MG tablet One half tab PO qHS, then increase gradually to one tab TID. 30 tablet 0  . ipratropium (ATROVENT) 0.06 % nasal spray Place 1 spray into both nostrils 4 (four) times daily as needed. 15 mL 0  . meloxicam (MOBIC) 15 MG tablet One tab PO qAM with breakfast for 2 weeks, then daily prn pain. 30 tablet 3   No current facility-administered medications on file prior to visit.    Allergies  Allergen Reactions  . Wellbutrin [Bupropion] Other (See Comments)    Worsening of symptoms      Review of Systems:  Constitutional: No recent illness  HEENT: No  headache, no vision change  Respiratory:  No  shortness of breath.   Musculoskeletal: No new myalgia/arthralgia, chronic neck/back issues as per HPI  Exam:  BP 107/67   Pulse 85   Ht 5\' 4"  (1.626 m)   Wt 137 lb (62.1  kg)   BMI 23.52 kg/m   Constitutional: VS see above. General Appearance: alert, well-developed, well-nourished, NAD  Musculoskeletal: Gait normal. Symmetric and independent movement of all extremities. No significant ropy texture or spasm to C/T-spine area, carries L shoulder a bit higher, normal ROM neck to F/E/R  Neurological: Normal balance/coordination. No tremor.  Skin: warm, dry, intact.   Psychiatric: Normal judgment/insight. Normal mood and affect. Oriented x3.    ASSESSMENT/PLAN: The primary encounter diagnosis was Somatic dysfunction of spine, cervical. A diagnosis of Somatic dysfunction of spine, thoracic was also pertinent to this visit.  OMT treatments for C and T spine and associated musculature: MFR, ME to (+) patient relief    Follow-up plan: Return for OMT treatments as needed.  Visit summary with medication list and pertinent instructions was printed for patient to review, alert us if any changes needed. All questions at time of visit were answered - patient instructed to contact office with any additional concerns. ER/RTC precautions were reviewed with the patient and understanding verbalized.

## 2016-11-26 MED FILL — MELOXICAM 15 MG TABLET: 15 | 30 days supply | Qty: 30 | Fill #1

## 2016-12-12 ENCOUNTER — Ambulatory Visit (INDEPENDENT_AMBULATORY_CARE_PROVIDER_SITE_OTHER): Payer: 59 | Admitting: Sports Medicine

## 2016-12-12 DIAGNOSIS — R635 Abnormal weight gain: Secondary | ICD-10-CM | POA: Diagnosis not present

## 2016-12-12 MED ORDER — PHENTERMINE HCL 37.5 MG PO TABS: ORAL_TABLET | ORAL | 0 refills | Status: DC

## 2016-12-12 NOTE — Progress Notes (Signed)
  Subjective:    CC: Abnormal weight gain  HPI: This is a pleasant 36 year old female, last year she had a fantastic job losing weight, dropping from 157 down to 133 pounds. Unfortunately over the past couple of months she started to gain weight again, she has only come up to 138 pounds but is eager to try some pharmacologic weight loss again.  Past medical history:  Negative.  See flowsheet/record as well for more information.  Surgical history: Negative.  See flowsheet/record as well for more information.  Family history: Negative.  See flowsheet/record as well for more information.  Social history: Negative.  See flowsheet/record as well for more information.  Allergies, and medications have been entered into the medical record, reviewed, and no changes needed.   Review of Systems: No fevers, chills, night sweats, weight loss, chest pain, or shortness of breath.   Objective:    General: Well Developed, well nourished, and in no acute distress.  Neuro: Alert and oriented x3, extra-ocular muscles intact, sensation grossly intact.  HEENT: Normocephalic, atraumatic, pupils equal round reactive to light, neck supple, no masses, no lymphadenopathy, thyroid nonpalpable.  Skin: Warm and dry, no rashes. Cardiac: Regular rate and rhythm, no murmurs rubs or gallops, no lower extremity edema.  Respiratory: Clear to auscultation bilaterally. Not using accessory muscles, speaking in full sentences.  Impression and Recommendations:    Abnormal weight gain Restarting phentermine, we did discuss aggressive resistance training in combination with fat loss. Return monthly for weight checks and refills.

## 2016-12-12 NOTE — Assessment & Plan Note (Signed)
Restarting phentermine, we did discuss aggressive resistance training in combination with fat loss. Return monthly for weight checks and refills.

## 2017-01-20 ENCOUNTER — Ambulatory Visit (INDEPENDENT_AMBULATORY_CARE_PROVIDER_SITE_OTHER): Payer: 59 | Admitting: Sports Medicine

## 2017-01-20 DIAGNOSIS — R635 Abnormal weight gain: Secondary | ICD-10-CM

## 2017-01-20 DIAGNOSIS — R58 Hemorrhage, not elsewhere classified: Secondary | ICD-10-CM | POA: Diagnosis not present

## 2017-01-20 MED ORDER — PHENTERMINE HCL 37.5 MG PO TABS: ORAL_TABLET | ORAL | 0 refills | Status: DC

## 2017-01-20 NOTE — Progress Notes (Signed)
  Subjective:    CC: Wrist bruising  HPI: This is a pleasant 36 year old female, this weekend she went golfing, does not recall any trauma but at one point felt some pain and saw some red spots on the volar aspect of her left wrist. She then developed some bruising, the bruises kind of sore but she doesn't have any pain over the dorsum of the wrist, or swelling of the joint itself, symptoms are mild, improving.  Abnormal weight gain: Good weight loss, would like a refill on phentermine.  Past medical history:  Negative.  See flowsheet/record as well for more information.  Surgical history: Negative.  See flowsheet/record as well for more information.  Family history: Negative.  See flowsheet/record as well for more information.  Social history: Negative.  See flowsheet/record as well for more information.  Allergies, and medications have been entered into the medical record, reviewed, and no changes needed.   Review of Systems: No fevers, chills, night sweats, weight loss, chest pain, or shortness of breath.   Objective:    General: Well Developed, well nourished, and in no acute distress.  Neuro: Alert and oriented x3, extra-ocular muscles intact, sensation grossly intact.  HEENT: Normocephalic, atraumatic, pupils equal round reactive to light, neck supple, no masses, no lymphadenopathy, thyroid nonpalpable.  Skin: Warm and dry, no rashes. Cardiac: Regular rate and rhythm, no murmurs rubs or gallops, no lower extremity edema.  Respiratory: Clear to auscultation bilaterally. Not using accessory muscles, speaking in full sentences. Left Wrist: No overt swelling, there is an ecchymosis on the volar wrist. ROM smooth and normal with good flexion and extension and ulnar/radial deviation that is symmetrical with opposite wrist. Palpation is normal over metacarpals, navicular, lunate, and TFCC; tendons without tenderness/ swelling No snuffbox tenderness. No tenderness over [place]  [place]. Strength 5/5 in all directions without pain. Negative tinel's and phalens signs. Negative Finkelstein sign. Negative Watson's test.  Wrist was strapped with compressive dressing.  Impression and Recommendations:    Ecchymosis His lipid left volar wrist ecchymosis. All tendons are intact, nerves are intact, neurovascularly intact distally. Strap with compressive dressing, no other evidence of bleeding, no further intervention needed. No imaging needed.  Abnormal weight gain Entering the second month of phentermine, doing well.

## 2017-01-20 NOTE — Assessment & Plan Note (Signed)
His lipid left volar wrist ecchymosis. All tendons are intact, nerves are intact, neurovascularly intact distally. Strap with compressive dressing, no other evidence of bleeding, no further intervention needed. No imaging needed.

## 2017-01-20 NOTE — Assessment & Plan Note (Signed)
Entering the second month of phentermine, doing well.

## 2017-01-21 ENCOUNTER — Ambulatory Visit: Payer: 59 | Admitting: Sports Medicine

## 2017-02-24 ENCOUNTER — Ambulatory Visit (INDEPENDENT_AMBULATORY_CARE_PROVIDER_SITE_OTHER): Payer: 59 | Admitting: Sports Medicine

## 2017-02-24 ENCOUNTER — Encounter: Payer: Self-pay | Admitting: Sports Medicine

## 2017-02-24 DIAGNOSIS — R635 Abnormal weight gain: Secondary | ICD-10-CM | POA: Diagnosis not present

## 2017-02-24 MED ORDER — PHENTERMINE HCL 37.5 MG PO TABS: ORAL_TABLET | ORAL | 0 refills | Status: DC

## 2017-02-24 NOTE — Progress Notes (Signed)
  Subjective:    CC: Follow-up  HPI: Abnormal weight gain: 3 pound weight loss after the second month, would like to continue. Does not want to drop below 120 so we will probably stop medication after this.  Past medical history:  Negative.  See flowsheet/record as well for more information.  Surgical history: Negative.  See flowsheet/record as well for more information.  Family history: Negative.  See flowsheet/record as well for more information.  Social history: Negative.  See flowsheet/record as well for more information.  Allergies, and medications have been entered into the medical record, reviewed, and no changes needed.   Review of Systems: No fevers, chills, night sweats, weight loss, chest pain, or shortness of breath.   Objective:    General: Well Developed, well nourished, and in no acute distress.  Neuro: Alert and oriented x3, extra-ocular muscles intact, sensation grossly intact.  HEENT: Normocephalic, atraumatic, pupils equal round reactive to light, neck supple, no masses, no lymphadenopathy, thyroid nonpalpable.  Skin: Warm and dry, no rashes. Cardiac: Regular rate and rhythm, no murmurs rubs or gallops, no lower extremity edema.  Respiratory: Clear to auscultation bilaterally. Not using accessory muscles, speaking in full sentences.  Impression and Recommendations:    Abnormal weight gain Entering the third month of phentermine, 3 pound weight loss after the last month. Refilling medication. Does not want to drop below 120 so we will probably stop medication after this.   ___________________________________________ Ihor Austinhomas J. Benjamin Stainhekkekandam, M.D., ABFM., CAQSM. Primary Care and Sports Medicine [facility]  Adjunct Instructor of Family Medicine  [employer]

## 2017-02-24 NOTE — Assessment & Plan Note (Signed)
Entering the third month of phentermine, 3 pound weight loss after the last month. Refilling medication. Does not want to drop below 120 so we will probably stop medication after this.

## 2017-03-11 ENCOUNTER — Telehealth: Payer: Self-pay

## 2017-03-11 DIAGNOSIS — R5383 Other fatigue: Secondary | ICD-10-CM

## 2017-03-11 DIAGNOSIS — R790 Abnormal level of blood mineral: Secondary | ICD-10-CM

## 2017-03-11 DIAGNOSIS — Z13220 Encounter for screening for lipoid disorders: Secondary | ICD-10-CM

## 2017-03-11 DIAGNOSIS — Z131 Encounter for screening for diabetes mellitus: Secondary | ICD-10-CM

## 2017-03-11 NOTE — Telephone Encounter (Signed)
Pt would like lab work put in the system so she can get it done prior to her physical on 03/26/17.

## 2017-03-12 NOTE — Telephone Encounter (Signed)
Teresa Garcia, What labs do you want Narya to have.

## 2017-03-12 NOTE — Telephone Encounter (Signed)
Unless she is having problems just lipid and cmp and cbc.

## 2017-03-13 NOTE — Telephone Encounter (Signed)
Labs ordered.

## 2017-03-13 NOTE — Addendum Note (Signed)
Addended by: Donne Anon L on: 03/13/2017 09:10 AM   Modules accepted: Orders

## 2017-03-14 DIAGNOSIS — R790 Abnormal level of blood mineral: Secondary | ICD-10-CM | POA: Diagnosis not present

## 2017-03-14 DIAGNOSIS — R5383 Other fatigue: Secondary | ICD-10-CM | POA: Diagnosis not present

## 2017-03-14 DIAGNOSIS — Z131 Encounter for screening for diabetes mellitus: Secondary | ICD-10-CM | POA: Diagnosis not present

## 2017-03-14 DIAGNOSIS — Z13220 Encounter for screening for lipoid disorders: Secondary | ICD-10-CM | POA: Diagnosis not present

## 2017-03-15 LAB — FERRITIN: FERRITIN: 12 ng/mL (ref 10–154)

## 2017-03-15 LAB — COMPLETE METABOLIC PANEL WITH GFR
AG RATIO: 1.4 (calc) (ref 1.0–2.5)
ALT: 13 U/L (ref 6–29)
AST: 15 U/L (ref 10–30)
Albumin: 4.1 g/dL (ref 3.6–5.1)
Alkaline phosphatase (APISO): 38 U/L (ref 33–115)
BILIRUBIN TOTAL: 0.3 mg/dL (ref 0.2–1.2)
BUN: 16 mg/dL (ref 7–25)
CHLORIDE: 106 mmol/L (ref 98–110)
CO2: 24 mmol/L (ref 20–32)
Calcium: 8.8 mg/dL (ref 8.6–10.2)
Creat: 0.9 mg/dL (ref 0.50–1.10)
GFR, EST AFRICAN AMERICAN: 96 mL/min/{1.73_m2} (ref >=60)
GFR, Est Non African American: 83 mL/min/{1.73_m2} (ref >=60)
GLOBULIN: 3 g/dL (ref 1.9–3.7)
Glucose, Bld: 97 mg/dL (ref 65–99)
POTASSIUM: 4 mmol/L (ref 3.5–5.3)
SODIUM: 137 mmol/L (ref 135–146)
TOTAL PROTEIN: 7.1 g/dL (ref 6.1–8.1)

## 2017-03-15 LAB — LIPID PANEL W/REFLEX DIRECT LDL
CHOL/HDL RATIO: 2.2 (calc) (ref <5.0)
Cholesterol: 143 mg/dL (ref <200)
HDL: 64 mg/dL (ref >50)
LDL Cholesterol (Calc): 67 mg/dL (calc)
Non-HDL Cholesterol (Calc): 79 mg/dL (calc) (ref <130)
TRIGLYCERIDES: 41 mg/dL (ref <150)

## 2017-03-15 LAB — VITAMIN D 25 HYDROXY (VIT D DEFICIENCY, FRACTURES): VIT D 25 HYDROXY: 33 ng/mL (ref 30–100)

## 2017-03-19 ENCOUNTER — Encounter: Payer: Self-pay | Admitting: Obstetrics & Gynecology

## 2017-03-19 ENCOUNTER — Ambulatory Visit (INDEPENDENT_AMBULATORY_CARE_PROVIDER_SITE_OTHER): Payer: 59 | Admitting: Obstetrics & Gynecology

## 2017-03-19 VITALS — BP 114/79 | HR 98 | Ht 64.0 in | Wt 129.0 lb

## 2017-03-19 DIAGNOSIS — N898 Other specified noninflammatory disorders of vagina: Secondary | ICD-10-CM | POA: Diagnosis not present

## 2017-03-19 DIAGNOSIS — R102 Pelvic and perineal pain: Secondary | ICD-10-CM | POA: Diagnosis not present

## 2017-03-19 NOTE — Progress Notes (Signed)
   Subjective:    Patient ID: Teresa Garcia, female    DOB: October 01, 1980, 36 y.o.   MRN: [id number]  HPI 36 yo DW P3 here with a 3 month h/o LLQ pain. It is usually daily, not excruciating  (but she has a high pain tolerance).  She is also having some yellowish vaginal discharge.   Review of Systems Pap 11/17 She has had a BTL.    Objective:   Physical Exam Well nourished, well hydrated white female, no apparent distress Breathing, conversing, and ambulating normally Abd- benign EG, vagina, cervix all normal Discharge appears normal Bimanual exam normal, mid plane, NT, no adnexal masses or tenderness     Assessment & Plan:  Vaginal discharge- wet prep sent LLQ pain- check gyn u/s

## 2017-03-20 LAB — CERVICOVAGINAL ANCILLARY ONLY
Bacterial vaginitis: NEGATIVE
CANDIDA VAGINITIS: NEGATIVE

## 2017-03-26 ENCOUNTER — Ambulatory Visit (INDEPENDENT_AMBULATORY_CARE_PROVIDER_SITE_OTHER): Payer: 59 | Admitting: Physician Assistant

## 2017-03-26 ENCOUNTER — Encounter: Payer: 59 | Admitting: Physician Assistant

## 2017-03-26 ENCOUNTER — Encounter: Payer: Self-pay | Admitting: Physician Assistant

## 2017-03-26 VITALS — BP 110/77 | HR 87 | Ht 64.0 in | Wt 126.0 lb

## 2017-03-26 DIAGNOSIS — Z0000 Encounter for general adult medical examination without abnormal findings: Secondary | ICD-10-CM | POA: Diagnosis not present

## 2017-03-26 DIAGNOSIS — F339 Major depressive disorder, recurrent, unspecified: Secondary | ICD-10-CM | POA: Insufficient documentation

## 2017-03-26 DIAGNOSIS — R4586 Emotional lability: Secondary | ICD-10-CM | POA: Diagnosis not present

## 2017-03-26 DIAGNOSIS — F419 Anxiety disorder, unspecified: Secondary | ICD-10-CM

## 2017-03-26 HISTORY — DX: Major depressive disorder, recurrent, unspecified: F33.9

## 2017-03-26 MED ORDER — DESOGESTREL-ETHINYL ESTRADIOL 0.15-30 MG-MCG PO TABS
1.0000 | ORAL_TABLET | Freq: Every day | ORAL | 11 refills | Status: DC
Start: 2017-03-26 — End: 2017-05-02
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
## 2017-03-26 NOTE — Patient Instructions (Signed)
Vortioxetine oral tablet What is this medicine? Vortioxetine (vor tee OX e teen) is used to treat depression. This medicine may be used for other purposes; ask your health care provider or pharmacist if you have questions. COMMON BRAND NAME(S): BRINTELLIX, Trintellix What should I tell my health care provider before I take this medicine? They need to know if you have any of these conditions: -bipolar disorder or a family history of bipolar disorder -bleeding disorders -drink alcohol -glaucoma -liver disease -low levels of sodium in the blood -seizures -suicidal thoughts, plans, or attempt; a previous suicide attempt by you or a family member -take medicines that treat or prevent blood clots -an unusual or allergic reaction to vortioxetine, other medicines, foods, dyes, or preservatives -pregnant or trying to get pregnant -breast-feeding How should I use this medicine? Take this medicine by mouth with a glass of water. Follow the directions on the prescription label. You can take it with or without food. If it upsets your stomach, take it with food. Take your medicine at regular intervals. Do not take it more often than directed. Do not stop taking this medicine suddenly except upon the advice of your doctor. Stopping this medicine too quickly may cause serious side effects or your condition may worsen. A special MedGuide will be given to you by the pharmacist with each prescription and refill. Be sure to read this information carefully each time. Talk to your pediatrician regarding the use of this medicine in children. Special care may be needed. Overdosage: If you think you have taken too much of this medicine contact a poison control center or emergency room at once. NOTE: This medicine is only for you. Do not share this medicine with others. What if I miss a dose? If you miss a dose, take it as soon as you can. If it is almost time for your next dose, take only that dose. Do not take  double or extra doses. What may interact with this medicine? Do not take this medicine with any of the following medications: -linezolid -MAOIs like Carbex, Eldepryl, Marplan, Nardil, and Parnate -methylene blue (injected into a vein) This medicine may also interact with the following medications: -alcohol -aspirin and aspirin-like medicines -carbamazepine -certain medicines for depression, anxiety, or psychotic disturbances -certain medicines for migraine headache like almotriptan, eletriptan, frovatriptan, naratriptan, rizatriptan, sumatriptan, zolmitriptan -diuretics -fentanyl -furazolidone -isoniazid -medicines that treat or prevent blood clots like warfarin, enoxaparin, and dalteparin -NSAIDs, medicines for pain and inflammation, like ibuprofen or naproxen -phenytoin -procarbazine -quinidine -rasagiline -rifampin -supplements like St. John's wort, kava kava, valerian -tramadol -tryptophan This list may not describe all possible interactions. Give your health care provider a list of all the medicines, herbs, non-prescription drugs, or dietary supplements you use. Also tell them if you smoke, drink alcohol, or use illegal drugs. Some items may interact with your medicine. What should I watch for while using this medicine? Tell your doctor if your symptoms do not get better or if they get worse. Visit your doctor or health care professional for regular checks on your progress. Because it may take several weeks to see the full effects of this medicine, it is important to continue your treatment as prescribed by your doctor. Patients and their families should watch out for new or worsening thoughts of suicide or depression. Also watch out for sudden changes in feelings such as feeling anxious, agitated, panicky, irritable, hostile, aggressive, impulsive, severely restless, overly excited and hyperactive, or not being able to sleep. If this happens,   especially at the beginning of  treatment or after a change in dose, call your health care professional. You may get drowsy or dizzy. Do not drive, use machinery, or do anything that needs mental alertness until you know how this medicine affects you. Do not stand or sit up quickly, especially if you are an older patient. This reduces the risk of dizzy or fainting spells. Alcohol may interfere with the effect of this medicine. Avoid alcoholic drinks. Your mouth may get dry. Chewing sugarless gum or sucking hard candy, and drinking plenty of water may help. Contact your doctor if the problem does not go away or is severe. What side effects may I notice from receiving this medicine? Side effects that you should report to your doctor or health care professional as soon as possible: -allergic reactions like skin rash, itching or hives, swelling of the face, lips, or tongue -anxious -black, tarry stools -changes in vision -confusion -elevated mood, decreased need for sleep, racing thoughts, impulsive behavior -eye pain -fast, irregular heartbeat -feeling faint or lightheaded, falls -feeling agitated, angry, or irritable -hallucination, loss of contact with reality -loss of balance or coordination -loss of memory -painful or prolonged erections -restlessness, pacing, inability to keep still -seizures -stiff muscles -suicidal thoughts or other mood changes -trouble sleeping -unusual bleeding or bruising -unusually weak or tired -vomiting Side effects that usually do not require medical attention (report to your doctor or health care professional if they continue or are bothersome): -change in appetite or weight -change in sex drive or performance -constipation -dizziness -dry mouth -nausea This list may not describe all possible side effects. Call your doctor for medical advice about side effects. You may report side effects to FDA at 1-800-FDA-1088. Where should I keep my medicine? Keep out of the reach of  children. Store at room temperature between 15 and 30 degrees C (59 and 86 degrees F). Throw away any unused medicine after the expiration date. NOTE: This sheet is a summary. It may not cover all possible information. If you have questions about this medicine, talk to your doctor, pharmacist, or health care provider.  2018 Elsevier/Gold Standard (2015-11-02 16:45:13)  

## 2017-03-27 ENCOUNTER — Encounter: Payer: Self-pay | Admitting: Physician Assistant

## 2017-03-27 DIAGNOSIS — R4586 Emotional lability: Secondary | ICD-10-CM | POA: Insufficient documentation

## 2017-03-27 NOTE — Addendum Note (Signed)
Addended by: Jomarie Longs on: 03/27/2017 01:46 AM   Modules accepted: Level of Service

## 2017-03-31 ENCOUNTER — Other Ambulatory Visit: Payer: Self-pay | Admitting: Physician Assistant

## 2017-03-31 DIAGNOSIS — R1032 Left lower quadrant pain: Secondary | ICD-10-CM

## 2017-04-02 ENCOUNTER — Ambulatory Visit: Payer: 59

## 2017-04-02 ENCOUNTER — Ambulatory Visit (INDEPENDENT_AMBULATORY_CARE_PROVIDER_SITE_OTHER): Payer: 59

## 2017-04-02 DIAGNOSIS — N926 Irregular menstruation, unspecified: Secondary | ICD-10-CM | POA: Diagnosis not present

## 2017-04-02 DIAGNOSIS — R1032 Left lower quadrant pain: Secondary | ICD-10-CM

## 2017-04-03 NOTE — Progress Notes (Signed)
Good afternoon,  Your ultrasound was normal. Abdominal pain is likely not coming from the ovary/pelvis.  Please follow-up with Lesly RubensteinJade if you are still experiencing symptoms!  Best, Vinetta Bergamoharley

## 2017-04-08 ENCOUNTER — Encounter: Payer: Self-pay | Admitting: Sports Medicine

## 2017-04-08 ENCOUNTER — Ambulatory Visit (INDEPENDENT_AMBULATORY_CARE_PROVIDER_SITE_OTHER): Payer: 59 | Admitting: Sports Medicine

## 2017-04-08 DIAGNOSIS — F339 Major depressive disorder, recurrent, unspecified: Secondary | ICD-10-CM

## 2017-04-08 DIAGNOSIS — R635 Abnormal weight gain: Secondary | ICD-10-CM | POA: Diagnosis not present

## 2017-04-08 MED ORDER — VORTIOXETINE HBR 5 MG PO TABS: 1.0000 | ORAL_TABLET | Freq: Every day | ORAL | 3 refills | Status: DC

## 2017-04-08 MED ORDER — PHENTERMINE HCL 37.5 MG PO TABS: ORAL_TABLET | ORAL | 0 refills | Status: DC

## 2017-04-08 NOTE — Progress Notes (Signed)
  Subjective:    CC: Follow-up  HPI: Abnormal weight gain: Gained some weight but did quit smoking, would like to continue phentermine.  Mood disorder: Anxiety and depression, irritability, Wellbutrin worsened irritability and mood lability, no suicidal or homicidal ideation, has had weight gain and untoward side effects with other SSRIs.  Past medical history:  Negative.  See flowsheet/record as well for more information.  Surgical history: Negative.  See flowsheet/record as well for more information.  Family history: Negative.  See flowsheet/record as well for more information.  Social history: Negative.  See flowsheet/record as well for more information.  Allergies, and medications have been entered into the medical record, reviewed, and no changes needed.   Review of Systems: No fevers, chills, night sweats, weight loss, chest pain, or shortness of breath.   Objective:    General: Well Developed, well nourished, and in no acute distress.  Neuro: Alert and oriented x3, extra-ocular muscles intact, sensation grossly intact.  HEENT: Normocephalic, atraumatic, pupils equal round reactive to light, neck supple, no masses, no lymphadenopathy, thyroid nonpalpable.  Skin: Warm and dry, no rashes. Cardiac: Regular rate and rhythm, no murmurs rubs or gallops, no lower extremity edema.  Respiratory: Clear to auscultation bilaterally. Not using accessory muscles, speaking in full sentences.  Impression and Recommendations:    Abnormal weight gain Weight gain but recently stopped smoking. Entering the fourth month. Does not want to drop below 120 pounds, I will taper her down if we had 120.  Depression, recurrent (HCC) Multiple diagnoses including fatigue, inattention, poor memory. Starting 5 mg Trintellix, return in 1 month, repeat GAD/PHQ. Has failed other SSRIs with intolerable adverse effects. ___________________________________________ Ihor Austinhomas J. Benjamin Stainhekkekandam, M.D., ABFM.,  CAQSM. Primary Care and Sports Medicine [facility]  Adjunct Instructor of Family Medicine  [employer]

## 2017-04-08 NOTE — Assessment & Plan Note (Signed)
Weight gain but recently stopped smoking. Entering the fourth month. Does not want to drop below 120 pounds, I will taper her down if we had 120.

## 2017-04-08 NOTE — Assessment & Plan Note (Signed)
Multiple diagnoses including fatigue, inattention, poor memory. Starting 5 mg Trintellix, return in 1 month, repeat GAD/PHQ. Has failed other SSRIs with intolerable adverse effects.

## 2017-04-18 MED FILL — TRINTELLIX 5 MG TABLET: 5 | 30 days supply | Qty: 30 | Fill #0

## 2017-05-02 ENCOUNTER — Ambulatory Visit: Payer: 59 | Admitting: Physician Assistant

## 2017-05-02 ENCOUNTER — Encounter: Payer: Self-pay | Admitting: Family Medicine

## 2017-05-02 ENCOUNTER — Ambulatory Visit (INDEPENDENT_AMBULATORY_CARE_PROVIDER_SITE_OTHER): Payer: 59 | Admitting: Family Medicine

## 2017-05-02 DIAGNOSIS — G5621 Lesion of ulnar nerve, right upper limb: Secondary | ICD-10-CM

## 2017-05-02 HISTORY — DX: Lesion of ulnar nerve, right upper limb: G56.21

## 2017-05-02 MED ORDER — PREDNISONE 5 MG (48) PO TBPK: ORAL_TABLET | ORAL | 0 refills | Status: DC

## 2017-05-02 NOTE — Patient Instructions (Signed)
Thank you for coming in today. Take prednisone daily for 12 days.  Let me know if want to take gabapentin.  Use the bulky splint.  Recheck as needed.   Cubital Tunnel Syndrome Cubital tunnel syndrome is a condition that causes pain and weakness of the forearm and hand. This condition happens when one of the nerves (ulnar nerve) that runs alongside the elbow joint becomes irritated. What are the causes? Causes of this condition include:  Increased pressure on the ulnar nerve at the elbow, arm, or forearm. This can be caused by: ? Swollen tissues. ? Ligaments. ? Muscles. ? Poorly healed elbow fractures. ? Tumors in the elbow. These are usually noncancerous (benign). ? Scar tissue that develops in the elbow after an injury. ? Bony growths (spurs) near the ulnar nerve.  Stretching of the nerve due to loose elbow ligaments.  Trauma to the nerve at the elbow.  Repetitive elbow bending.  Certain medical conditions.  What increases the risk? This condition is more likely to develop in:  People who do manual labor that requires frequently bending the elbow.  People who play sports that include repeated or strenuous throwing motions, such as baseball.  People who play contact sports, such as football or lacrosse.  People who do not warm up properly before activities.  People who have diabetes.  People who have an underactive thyroid (hypothyroidism).  What are the signs or symptoms? Symptoms of this condition include:  Clumsiness or weakness of the hand.  Tenderness of the inner elbow.  Aching or soreness of the inner elbow, forearm, or fingers, especially the little finger or the ring finger.  Increased pain with forced elbow bending.  Reduced control when throwing.  Tingling, numbness, or burning inside the forearm, or in part of the hand or fingers, especially the little finger or the ring finger.  Sharp pains that shoot from the elbow down to the wrist and  hand.  The inability to grip or pinch hard.  How is this diagnosed? This condition is diagnosed with a medical history and physical exam. Your health care provider will ask about your symptoms and ask for details about any injury. You may also have other tests, including:  Electromyogram (EMG). This test checks how well the nerve is working.  X-ray.  How is this treated? Treatment starts by stopping the activities that are causing your symptoms to get worse. Treatment may include the use of icing and medicines to reduce pain and swelling. You may also be advised to wear a splint to prevent your elbow from bending or wear an elbow pad where the ulnar nerve is closest to the skin. In less severe cases, treatment may also include working with a physical therapist:  To help decrease your symptoms.  To improve the strength and range of motion of your elbow, forearm, and hand.  If the treatments described above do not help, surgery may be needed. Follow these instructions at home: If you have a splint:  Wear it as told by your health care provider. Remove it only as told by your health care provider.  Loosen the splint if your fingers become numb and tingle, or if they turn cold and blue.  Keep the splint clean and dry. Managing pain, stiffness, and swelling  If directed, apply ice to the injured area: ? Put ice in a plastic bag. ? Place a towel between your skin and the bag. ? Leave the ice on for 20 minutes, 2-3 times per day.  Move your fingers often to avoid stiffness and to lessen swelling.  Raise (elevate) the injured area above the level of your heart while you are sitting or lying down. General instructions  Take over-the-counter and prescription medicines only as told by your health care provider.  Keep all follow-up visits as told by your health care provider. This is important.  Do any exercise or physical therapy as told by your health care provider.  Do not drive  or operate heavy machinery while taking prescription pain medicine.  If you were given an elbow pad, wear it as told by your health care provider. Contact a health care provider if:  Your symptoms get worse.  Your symptoms do not get better with treatment.  Your have new pain.  Your hand on the injured side feels numb or cold. This information is not intended to replace advice given to you by your health care provider. Make sure you discuss any questions you have with your health care provider. Document Released: 06/03/2005 Document Revised: 11/09/2015 Document Reviewed: 08/10/2014 Elsevier Interactive Patient Education  [employer]2018 Elsevier Inc.

## 2017-05-02 NOTE — Progress Notes (Signed)
Alleen BorneMichelle Sigley is a 36 y.o. female who [facility] today for right hand numbness.  Patient notes a 3-week history of pain tingling and numbness to her ulnar right side.  This typically is worse with work.  The symptoms do not wake her from sleep although she notes that she is a heavy sleeper.  She has not tried any medications yet.  She denies any injury or weakness.  She feels well otherwise with no fevers or chills.   No past medical history on file. Past Surgical History:  Procedure Laterality Date  . TUBAL LIGATION     Social History   Tobacco Use  . Smoking status: Current Every Day Smoker    Packs/day: 0.25    Types: E-cigarettes  . Smokeless tobacco: Never Used  Substance Use Topics  . Alcohol use: Yes    Comment: Socially     ROS:  As above   Medications: Current Outpatient Medications  Medication Sig Dispense Refill  . cyclobenzaprine (FLEXERIL) 10 MG tablet One half tab PO qHS, then increase gradually to one tab TID. 30 tablet 0  . ipratropium (ATROVENT) 0.06 % nasal spray Place 1 spray into both nostrils 4 (four) times daily as needed. 15 mL 0  . meloxicam (MOBIC) 15 MG tablet One tab PO qAM with breakfast for 2 weeks, then daily prn pain. 30 tablet 3  . phentermine (ADIPEX-P) 37.5 MG tablet One tab by mouth qAM 30 tablet 0  . vortioxetine HBr (TRINTELLIX) 5 MG TABS Take 1 tablet (5 mg total) by mouth daily. 30 tablet 3  . predniSONE (STERAPRED UNI-PAK 48 TAB) 5 MG (48) TBPK tablet 12 day dosepack po 48 tablet 0   No current facility-administered medications for this visit.    Allergies  Allergen Reactions  . Wellbutrin [Bupropion] Other (See Comments)    Worsening of symptoms     Exam:  BP 102/71   Pulse 78   Wt 125 lb (56.7 kg)   BMI 21.46 kg/m  General: Well Developed, well nourished, and in no acute distress.  Neuro/Psych: Alert and oriented x3, extra-ocular muscles intact, able to move all  4 extremities, sensation grossly intact. Skin: Warm and dry, no rashes noted.  Respiratory: Not using accessory muscles, speaking in full sentences, trachea midline.  Cardiovascular: Pulses palpable, no extremity edema. Abdomen: Does not appear distended. MSK:  C-spine nontender to midline normal neck motion negative Spurling's test. Upper extremity strength is equal normal throughout. Right elbow normal-appearing positive Tinel's at the cubital tunnel. Paresthesias are reproducible with elbow flexion longer than about 20 seconds. Wrist normal-appearing nontender negative Tinel's over the carpal tunnel and ulnar canal. Hand strength and sensation pulses and capillary refill are intact    No results found for this or any previous visit (from the past 48 hour(s)). No results found.    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  We discussed options.  Plan for a trial of oral prednisone and night splinting.  If not improved will proceed with injection.  Symptoms are not particularly bothersome at night therefore I do not think gabapentin will be helpful enough to warrant the obnoxious sedation side effects.  Recheck in a few weeks as needed.  No orders of the defined types were placed in this encounter.  Meds ordered this encounter  Medications  . predniSONE (STERAPRED UNI-PAK 48 TAB) 5 MG (48) TBPK tablet    Sig: 12 day dosepack po  Dispense:  48 tablet    Refill:  0    Discussed warning signs or symptoms. Please see discharge instructions. Patient expresses understanding.

## 2017-05-05 ENCOUNTER — Ambulatory Visit: Payer: 59 | Admitting: Physician Assistant

## 2017-05-19 ENCOUNTER — Ambulatory Visit (INDEPENDENT_AMBULATORY_CARE_PROVIDER_SITE_OTHER): Payer: 59 | Admitting: Physician Assistant

## 2017-05-19 ENCOUNTER — Encounter: Payer: Self-pay | Admitting: Physician Assistant

## 2017-05-19 VITALS — BP 123/84 | HR 105 | Temp 99.2°F

## 2017-05-19 DIAGNOSIS — R6883 Chills (without fever): Secondary | ICD-10-CM

## 2017-05-19 DIAGNOSIS — R05 Cough: Secondary | ICD-10-CM

## 2017-05-19 DIAGNOSIS — R69 Illness, unspecified: Secondary | ICD-10-CM | POA: Diagnosis not present

## 2017-05-19 DIAGNOSIS — J029 Acute pharyngitis, unspecified: Secondary | ICD-10-CM

## 2017-05-19 DIAGNOSIS — R0981 Nasal congestion: Secondary | ICD-10-CM | POA: Diagnosis not present

## 2017-05-19 DIAGNOSIS — J111 Influenza due to unidentified influenza virus with other respiratory manifestations: Secondary | ICD-10-CM

## 2017-05-19 NOTE — Progress Notes (Signed)
HPI:                                                                Alleen BorneMichelle Sasaki is a 36 y.o. female who [facility] today for sinus congestion  Sinus Problem  This is a new problem. The current episode started in the past 7 days. The problem has been gradually worsening since onset. There has been no fever. Associated symptoms include chills, congestion, coughing (non-productive), headaches, sinus pressure and a sore throat. Pertinent negatives include no ear pain or shortness of breath. Past treatments include oral decongestants. The treatment provided no relief.     No past medical history on file. Past Surgical History:  Procedure Laterality Date  . TUBAL LIGATION     Social History   Tobacco Use  . Smoking status: Current Every Day Smoker    Packs/day: 0.25    Types: E-cigarettes  . Smokeless tobacco: Never Used  Substance Use Topics  . Alcohol use: Yes    Comment: Socially   family history includes Diabetes in her maternal grandmother and paternal grandmother.  ROS: negative except as noted in the HPI  Medications: Current Outpatient Medications  Medication Sig Dispense Refill  . cyclobenzaprine (FLEXERIL) 10 MG tablet One half tab PO qHS, then increase gradually to one tab TID. 30 tablet 0  . ipratropium (ATROVENT) 0.06 % nasal spray Place 1 spray into both nostrils 4 (four) times daily as needed. 15 mL 0  . meloxicam (MOBIC) 15 MG tablet One tab PO qAM with breakfast for 2 weeks, then daily prn pain. 30 tablet 3  . phentermine (ADIPEX-P) 37.5 MG tablet One tab by mouth qAM 30 tablet 0  . vortioxetine HBr (TRINTELLIX) 5 MG TABS Take 1 tablet (5 mg total) by mouth daily. 30 tablet 3   No current facility-administered medications for this visit.    Allergies  Allergen Reactions  . Wellbutrin [Bupropion] Other (See Comments)    Worsening of symptoms       Objective:  BP 123/84   Pulse (!) 105    Temp 99.2 F (37.3 C) (Oral)  Gen:  alert, not ill-appearing, no distress, appropriate for age HEENT: head normocephalic without obvious abnormality, conjunctiva and cornea clear, TM's clear bilaterally, nasal mucosa edematous, oropharynx with mild erythema, no tonsillar exudates, neck supple, no adenopathy, trachea midline Pulm: Normal work of breathing, normal phonation, clear to auscultation bilaterally, no wheezes, rales or rhonchi CV: Tachycardic, regular rhythm, s1 and s2 distinct, no murmurs, clicks or rubs  Neuro: alert and oriented x 3, no tremor MSK: extremities atraumatic, normal gait and station Skin: intact, no rashes on exposed skin, no jaundice, no cyanosis  No results found for this or any previous visit (from the past 72 hour(s)). No results found.    Assessment and Plan: 36 y.o. female with   1. Influenza-like illness - she is outside the window for Tamiflu - supportive care with Meloxicam and Tylenol - push PO fluids  Patient education and anticipatory guidance given Patient agrees with treatment plan Follow-up as needed if symptoms worsen or fail to improve  Levonne Hubertharley E. Shawnmichael Parenteau PA-C

## 2017-05-19 NOTE — Patient Instructions (Signed)

## 2017-05-20 ENCOUNTER — Encounter: Payer: Self-pay | Admitting: Physician Assistant

## 2017-05-29 ENCOUNTER — Encounter: Payer: Self-pay | Admitting: Sports Medicine

## 2017-05-29 ENCOUNTER — Ambulatory Visit (INDEPENDENT_AMBULATORY_CARE_PROVIDER_SITE_OTHER): Payer: 59 | Admitting: Sports Medicine

## 2017-05-29 DIAGNOSIS — K582 Mixed irritable bowel syndrome: Secondary | ICD-10-CM | POA: Diagnosis not present

## 2017-05-29 DIAGNOSIS — R635 Abnormal weight gain: Secondary | ICD-10-CM

## 2017-05-29 DIAGNOSIS — F339 Major depressive disorder, recurrent, unspecified: Secondary | ICD-10-CM | POA: Diagnosis not present

## 2017-05-29 HISTORY — DX: Mixed irritable bowel syndrome: K58.2

## 2017-05-29 MED ORDER — PHENTERMINE HCL 37.5 MG PO TABS: 18.7500 mg | ORAL_TABLET | Freq: Every day | ORAL | 0 refills | Status: DC

## 2017-05-29 NOTE — Progress Notes (Signed)
  Subjective:    CC: Abnormal weight gain  HPI: Weight: Teresa Garcia has lost a significant amount of weight, she is happy where she is now and is agreeable to start the down taper.  Anxiety and depression: Improved on its own, she only took Trintellix for a couple of days.  GI issues: Gets occasional episodes of tenesmus, need to stool, no bloody, tarry, mucus in the stool.  This occurs typically with times of stress, and when rushed.  She suspected that she had a diagnosis of irritable bowel syndrome.  Past medical history:  Negative.  See flowsheet/record as well for more information.  Surgical history: Negative.  See flowsheet/record as well for more information.  Family history: Negative.  See flowsheet/record as well for more information.  Social history: Negative.  See flowsheet/record as well for more information.  Allergies, and medications have been entered into the medical record, reviewed, and no changes needed.   (To billers/coders, pertinent past medical, social, surgical, family history can be found in problem list, if problem list is marked as reviewed then this indicates that past medical, social, surgical, family history was also reviewed)  Review of Systems: No fevers, chills, night sweats, weight loss, chest pain, or shortness of breath.   Objective:    General: Well Developed, well nourished, and in no acute distress.  Neuro: Alert and oriented x3, extra-ocular muscles intact, sensation grossly intact.  HEENT: Normocephalic, atraumatic, pupils equal round reactive to light, neck supple, no masses, no lymphadenopathy, thyroid nonpalpable.  Skin: Warm and dry, no rashes. Cardiac: Regular rate and rhythm, no murmurs rubs or gallops, no lower extremity edema.  Respiratory: Clear to auscultation bilaterally. Not using accessory muscles, speaking in full sentences.  Impression and Recommendations:    Abnormal weight gain At this point we finished 4 months, dropping to a  half tab, weight is good, and we will maintain this weight through swimsuit season.  Mixed irritable bowel syndrome Increase fiber in diet. We can certainly add Levsin or Bentyl if symptoms become persistent.  Depression, recurrent (HCC) Doing extremely well, off of medication (Trintellix). We can keep this on the back burner for now.  ___________________________________________ Ihor Austinhomas J. Benjamin Stainhekkekandam, M.D., ABFM., CAQSM. Primary Care and Sports Medicine [facility]  Adjunct Instructor of Family Medicine  [employer]

## 2017-05-29 NOTE — Assessment & Plan Note (Signed)
Increase fiber in diet. We can certainly add Levsin or Bentyl if symptoms become persistent.

## 2017-05-29 NOTE — Assessment & Plan Note (Signed)
At this point we finished 4 months, dropping to a half tab, weight is good, and we will maintain this weight through swimsuit season.

## 2017-05-29 NOTE — Assessment & Plan Note (Addendum)
Doing extremely well, off of medication (Trintellix). We can keep this on the back burner for now.

## 2017-06-13 ENCOUNTER — Telehealth: Payer: Self-pay | Admitting: Physician Assistant

## 2017-07-31 ENCOUNTER — Encounter: Payer: Self-pay | Admitting: Physician Assistant

## 2017-07-31 ENCOUNTER — Ambulatory Visit (INDEPENDENT_AMBULATORY_CARE_PROVIDER_SITE_OTHER): Payer: 59 | Admitting: Physician Assistant

## 2017-07-31 ENCOUNTER — Ambulatory Visit (INDEPENDENT_AMBULATORY_CARE_PROVIDER_SITE_OTHER): Payer: 59

## 2017-07-31 VITALS — BP 110/67 | HR 95 | Ht 64.0 in | Wt 124.0 lb

## 2017-07-31 DIAGNOSIS — R221 Localized swelling, mass and lump, neck: Secondary | ICD-10-CM

## 2017-07-31 DIAGNOSIS — R202 Paresthesia of skin: Secondary | ICD-10-CM

## 2017-07-31 DIAGNOSIS — R1310 Dysphagia, unspecified: Secondary | ICD-10-CM

## 2017-07-31 DIAGNOSIS — R200 Anesthesia of skin: Secondary | ICD-10-CM | POA: Diagnosis not present

## 2017-07-31 DIAGNOSIS — G2581 Restless legs syndrome: Secondary | ICD-10-CM | POA: Diagnosis not present

## 2017-07-31 DIAGNOSIS — I7300 Raynaud's syndrome without gangrene: Secondary | ICD-10-CM

## 2017-07-31 DIAGNOSIS — M545 Low back pain, unspecified: Secondary | ICD-10-CM

## 2017-07-31 DIAGNOSIS — K5904 Chronic idiopathic constipation: Secondary | ICD-10-CM

## 2017-07-31 DIAGNOSIS — G8929 Other chronic pain: Secondary | ICD-10-CM | POA: Diagnosis not present

## 2017-07-31 DIAGNOSIS — M5416 Radiculopathy, lumbar region: Secondary | ICD-10-CM | POA: Diagnosis not present

## 2017-07-31 MED ORDER — ROPINIROLE HCL 0.25 MG PO TABS: ORAL_TABLET | ORAL | 0 refills | Status: DC

## 2017-07-31 NOTE — Patient Instructions (Signed)
Raynaud Phenomenon Raynaud phenomenon is a condition that affects the blood vessels (arteries) that carry blood to your fingers and toes. The arteries that supply blood to your ears or the tip of your nose might also be affected. Raynaud phenomenon causes the arteries to temporarily narrow. As a result, the flow of blood to the affected areas is temporarily decreased. This usually occurs in response to cold temperatures or stress. During an attack, the skin in the affected areas turns white. You may also feel tingling or numbness in those areas. Attacks usually last for only a brief period, and then the blood flow to the area returns to normal. In most cases, Raynaud phenomenon does not cause serious health problems. What are the causes? For many people with this condition, the cause is not known. Raynaud phenomenon is sometimes associated with other diseases, such as scleroderma or lupus. What increases the risk? Raynaud phenomenon can affect anyone, but it develops most often in people who are 20-40 years old. It affects more females than males. What are the signs or symptoms? Symptoms of Raynaud phenomenon may occur when you are exposed to cold temperatures or when you have emotional stress. The symptoms may last for a few minutes or up to several hours. They usually affect your fingers but may also affect your toes, ears, or the tip of your nose. Symptoms may include:  Changes in skin color. The skin in the affected areas will turn pale or white. The skin may then change from white to bluish to red as normal blood flow returns to the area.  Numbness, tingling, or pain in the affected areas.  In severe cases, sores may develop in the affected areas. How is this diagnosed? Your health care provider will do a physical exam and take your medical history. You may be asked to put your hands in cold water to check for a reaction to cold temperature. Blood tests may be done to check for other diseases or  conditions. Your health care provider may also order a test to check the movement of blood through your arteries and veins (vascular ultrasound). How is this treated? Treatment often involves making lifestyle changes and taking steps to control your exposure to cold temperatures. For more severe cases, medicine (calcium channel blockers) may be used to improve blood flow. Surgery is sometimes done to block the nerves that control the affected arteries, but this is rare. Follow these instructions at home:  Avoid exposure to cold by taking these steps: ? If possible, stay indoors during cold weather. ? When you go outside during cold weather, dress in layers and wear mittens, a hat, a scarf, and warm footwear. ? Wear mittens or gloves when handling ice or frozen food. ? Use holders for glasses or cans containing cold drinks. ? Let warm water run for a while before taking a shower or bath. ? Warm up the car before driving in cold weather.  If possible, avoid stressful and emotional situations. Exercise, meditation, and yoga may help you cope with stress. Biofeedback may be useful.  Do not use any tobacco products, including cigarettes, chewing tobacco, or electronic cigarettes. If you need help quitting, ask your health care provider.  Avoid secondhand smoke.  Limit your use of caffeine. Switch to decaffeinated coffee, tea, and soda. Avoid chocolate.  Wear loose fitting socks and comfortable, roomy shoes.  Avoid vibrating tools and machinery.  Take medicines only as directed by your health care provider. Contact a health care provider if:    Your discomfort becomes worse despite lifestyle changes.  You develop sores on your fingers or toes that do not heal.  Your fingers or toes turn black.  You have breaks in the skin on your fingers or toes.  You have a fever.  You have pain or swelling in your joints.  You have a rash.  Your symptoms occur on only one side of your body. This  information is not intended to replace advice given to you by your health care provider. Make sure you discuss any questions you have with your health care provider. Document Released: 05/31/2000 Document Revised: 11/09/2015 Document Reviewed: 12/06/2015 Elsevier Interactive Patient Education  2017 [employer]  

## 2017-07-31 NOTE — Progress Notes (Signed)
Call pt: essential normal. 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

## 2017-07-31 NOTE — Progress Notes (Signed)
Subjective:    Patient ID: Teresa Garcia, female    DOB: 1981-01-02, 37 y.o.   MRN: [id number]  HPI  Pt is a 37 yo female with history of myalgia and chronic neck pain who presents to the clinic with multiple concerns.   She is having episodes where tips of her finger usually middle finger goes white. Most recently happened in left hand only.  Responds well to heat. It does feel numb and tingling when this happens. It has occurred in her toes as well usually pink toes.   She is also concerned about paresthesias below waist. This has been going for many months. She is not on any new medications.symptoms are worse when she lays down. She denies any vision loss but does she "sparkles" in both eyes.  She did try trintellix but caused a lot of IBS. She stopped this. She continues to have problems with constipation.  Overall she feels like her mood is ok. She denies any back pain. She does have "tightness in backs of legs" that is worse at night.   She does have some chronic neck pain that was thought to be more myofascial but nothing has helped.   She is also concerned with problems swallowing. She just "feels like something is stuck in her throat and pushing on esophagus". Even with liquids she feels like hard to swallow. No choking. This has been going on for a while, maybe at least a year.   She is not smoking but continues to vape regularly.   She does have an aunt they thought had MS but had fibromyalgia.   .. Active Ambulatory Problems    Diagnosis Date Noted  . Myalgia and myositis 02/20/2016  . Bilateral leg pain 02/20/2016  . Right knee pain 02/20/2016  . Decreased iron stores 02/21/2016  . Cervical paraspinal muscle spasm 03/29/2016  . Abnormal weight gain 06/06/2016  . Tonsil stone 08/05/2016  . Ecchymosis 01/20/2017  . Depression, recurrent (HCC) 03/26/2017  . Cubital tunnel syndrome on right 05/02/2017  . Mixed irritable bowel syndrome 05/29/2017  .  Dysphagia 08/04/2017  . Fullness of neck 08/04/2017  . Chronic bilateral low back pain without sciatica 08/04/2017  . RLS (restless legs syndrome) 08/04/2017  . Numbness and tingling 08/04/2017  . Raynaud's disease without gangrene 08/04/2017   Resolved Ambulatory Problems    Diagnosis Date Noted  . No energy 02/20/2016  . Inattention 07/15/2016  . Anxiety 09/25/2016  . Forgetfulness 09/25/2016  . Mood changes 03/27/2017   No Additional Past Medical History     Review of Systems  All other systems reviewed and are negative.      Objective:   Physical Exam  Constitutional: She is oriented to person, place, and time. She appears well-developed and well-nourished.  HENT:  Head: Normocephalic and atraumatic.  Cardiovascular: Normal rate, regular rhythm and normal heart sounds.  Pulmonary/Chest: Effort normal and breath sounds normal. She has no wheezes.  Abdominal: Soft. Bowel sounds are normal. She exhibits no distension and no mass. There is no tenderness. There is no rebound and no guarding.  Musculoskeletal:  Upper and lower extremities 5/5.   Lymphadenopathy:    She has no cervical adenopathy.  Neurological: She is alert and oriented to person, place, and time. She has normal reflexes. No cranial nerve deficit.  Psychiatric: She has a normal mood and affect. Her behavior is normal.          Assessment & Plan:  Marland KitchenMarland KitchenDiagnoses and all orders for  this visit:  Numbness and tingling -     B12 and Folate Panel -     COMPLETE METABOLIC PANEL WITH GFR -     TSH -     Lipid Panel w/reflex Direct LDL -     US THYROID -     ANA -     CBC with Differential/Platelet -     DG Lumbar Spine Complete -     Magnesium  Dysphagia, unspecified type -     US THYROID  Fullness of neck -     US THYROID  Chronic bilateral low back pain without sciatica -     DG Lumbar Spine Complete  RLS (restless legs syndrome) -     rOPINIRole (REQUIP) 0.25 MG tablet; Take one to three  tablets at bedtime.  Raynaud's disease without gangrene  Chronic idiopathic constipation -     lubiprostone (AMITIZA) 8 MCG capsule; Take 1 capsule (8 mcg total) by mouth 2 (two) times daily with a meal. For IBS constipation.   Unclear at this time where numbness and tingling are coming from.  Labs ordered to look at b12, cbc, tsh.  Lumbar xray ordered to look at back.  Due to extensive paresthesia will consider MRI to look for demyelinating process.    For RLS symptoms try requip at bedtime.   For constipation try amitiza.   For dysphagia will check TSH and get neck ultrasound. If normal consider GI follow up.   Discussed raynaud's and goal is too keep extremities warm. Pt declined CCB. Follow up as needed. Stop vaping.   Marland Kitchen..Spent 40 minutes with patient and greater than 50 percent of visit spent counseling patient regarding treatment plan.

## 2017-08-01 DIAGNOSIS — R200 Anesthesia of skin: Secondary | ICD-10-CM | POA: Diagnosis not present

## 2017-08-01 DIAGNOSIS — R202 Paresthesia of skin: Secondary | ICD-10-CM | POA: Diagnosis not present

## 2017-08-03 NOTE — Progress Notes (Signed)
Call pt: labs look really good.

## 2017-08-04 DIAGNOSIS — I7300 Raynaud's syndrome without gangrene: Secondary | ICD-10-CM

## 2017-08-04 DIAGNOSIS — M545 Low back pain, unspecified: Secondary | ICD-10-CM

## 2017-08-04 DIAGNOSIS — R221 Localized swelling, mass and lump, neck: Secondary | ICD-10-CM | POA: Insufficient documentation

## 2017-08-04 DIAGNOSIS — R202 Paresthesia of skin: Secondary | ICD-10-CM | POA: Insufficient documentation

## 2017-08-04 DIAGNOSIS — G2581 Restless legs syndrome: Secondary | ICD-10-CM | POA: Insufficient documentation

## 2017-08-04 DIAGNOSIS — R1310 Dysphagia, unspecified: Secondary | ICD-10-CM | POA: Insufficient documentation

## 2017-08-04 DIAGNOSIS — G8929 Other chronic pain: Secondary | ICD-10-CM | POA: Insufficient documentation

## 2017-08-04 HISTORY — DX: Low back pain, unspecified: M54.50

## 2017-08-04 HISTORY — DX: Raynaud's syndrome without gangrene: I73.00

## 2017-08-04 HISTORY — DX: Restless legs syndrome: G25.81

## 2017-08-04 HISTORY — DX: Other chronic pain: G89.29

## 2017-08-04 LAB — CBC WITH DIFFERENTIAL/PLATELET
Basophils Absolute: 59 cells/uL (ref 0–200)
Basophils Relative: 1.3 %
EOS PCT: 4.2 %
Eosinophils Absolute: 189 cells/uL (ref 15–500)
HEMATOCRIT: 39.6 % (ref 35.0–45.0)
HEMOGLOBIN: 12.9 g/dL (ref 11.7–15.5)
LYMPHS ABS: 1634 {cells}/uL (ref 850–3900)
MCH: 29.5 pg (ref 27.0–33.0)
MCHC: 32.6 g/dL (ref 32.0–36.0)
MCV: 90.4 fL (ref 80.0–100.0)
MPV: 10.6 fL (ref 7.5–12.5)
Monocytes Relative: 6.5 %
NEUTROS ABS: 2327 {cells}/uL (ref 1500–7800)
Neutrophils Relative %: 51.7 %
Platelets: 277 10*3/uL (ref 140–400)
RBC: 4.38 10*6/uL (ref 3.80–5.10)
RDW: 12.3 % (ref 11.0–15.0)
Total Lymphocyte: 36.3 %
WBC mixed population: 293 cells/uL (ref 200–950)
WBC: 4.5 10*3/uL (ref 3.8–10.8)

## 2017-08-04 LAB — B12 AND FOLATE PANEL
FOLATE: 13.5 ng/mL
Vitamin B-12: 798 pg/mL (ref 200–1100)

## 2017-08-04 LAB — COMPLETE METABOLIC PANEL WITH GFR
AG RATIO: 1.6 (calc) (ref 1.0–2.5)
ALBUMIN MSPROF: 4.4 g/dL (ref 3.6–5.1)
ALT: 13 U/L (ref 6–29)
AST: 15 U/L (ref 10–30)
Alkaline phosphatase (APISO): 40 U/L (ref 33–115)
BILIRUBIN TOTAL: 0.5 mg/dL (ref 0.2–1.2)
BUN: 16 mg/dL (ref 7–25)
CHLORIDE: 106 mmol/L (ref 98–110)
CO2: 26 mmol/L (ref 20–32)
Calcium: 9.5 mg/dL (ref 8.6–10.2)
Creat: 0.91 mg/dL (ref 0.50–1.10)
GFR, EST AFRICAN AMERICAN: 95 mL/min/{1.73_m2} (ref >=60)
GFR, Est Non African American: 82 mL/min/{1.73_m2} (ref >=60)
Globulin: 2.7 g/dL (calc) (ref 1.9–3.7)
Glucose, Bld: 87 mg/dL (ref 65–99)
POTASSIUM: 4.7 mmol/L (ref 3.5–5.3)
Sodium: 139 mmol/L (ref 135–146)
TOTAL PROTEIN: 7.1 g/dL (ref 6.1–8.1)

## 2017-08-04 LAB — MAGNESIUM: Magnesium: 2.2 mg/dL (ref 1.5–2.5)

## 2017-08-04 LAB — LIPID PANEL W/REFLEX DIRECT LDL
Cholesterol: 173 mg/dL (ref <200)
HDL: 69 mg/dL (ref >50)
LDL Cholesterol (Calc): 95 mg/dL (calc)
NON-HDL CHOLESTEROL (CALC): 104 mg/dL (ref <130)
Total CHOL/HDL Ratio: 2.5 (calc) (ref <5.0)
Triglycerides: 32 mg/dL (ref <150)

## 2017-08-04 LAB — ANA: ANA: NEGATIVE

## 2017-08-04 LAB — TSH: TSH: 1.56 mIU/L

## 2017-08-04 MED ORDER — LUBIPROSTONE 8 MCG PO CAPS: 8.0000 ug | ORAL_CAPSULE | Freq: Two times a day (BID) | ORAL | 2 refills | Status: DC

## 2017-08-05 ENCOUNTER — Other Ambulatory Visit: Payer: Self-pay | Admitting: *Deleted

## 2017-08-05 ENCOUNTER — Other Ambulatory Visit: Payer: Self-pay | Admitting: Physician Assistant

## 2017-08-05 DIAGNOSIS — R1314 Dysphagia, pharyngoesophageal phase: Secondary | ICD-10-CM

## 2017-08-05 MED ORDER — LINACLOTIDE 290 MCG PO CAPS: 290.0000 ug | ORAL_CAPSULE | Freq: Every day | ORAL | 1 refills | Status: DC

## 2017-08-06 ENCOUNTER — Ambulatory Visit (INDEPENDENT_AMBULATORY_CARE_PROVIDER_SITE_OTHER): Payer: 59

## 2017-08-06 DIAGNOSIS — R221 Localized swelling, mass and lump, neck: Secondary | ICD-10-CM

## 2017-08-06 DIAGNOSIS — R1310 Dysphagia, unspecified: Secondary | ICD-10-CM | POA: Diagnosis not present

## 2017-08-06 DIAGNOSIS — E049 Nontoxic goiter, unspecified: Secondary | ICD-10-CM | POA: Diagnosis not present

## 2017-08-11 ENCOUNTER — Ambulatory Visit (INDEPENDENT_AMBULATORY_CARE_PROVIDER_SITE_OTHER): Payer: 59

## 2017-08-11 DIAGNOSIS — R202 Paresthesia of skin: Secondary | ICD-10-CM | POA: Diagnosis not present

## 2017-08-11 DIAGNOSIS — R200 Anesthesia of skin: Secondary | ICD-10-CM | POA: Diagnosis not present

## 2017-08-11 DIAGNOSIS — R1314 Dysphagia, pharyngoesophageal phase: Secondary | ICD-10-CM

## 2017-08-11 MED ORDER — GADOBENATE DIMEGLUMINE 529 MG/ML IV SOLN
10.0000 mL | Freq: Once | INTRAVENOUS | Status: AC | PRN
  Administered 2017-08-11: 10 mL via INTRAVENOUS

## 2017-08-11 NOTE — Progress Notes (Signed)
Call pt: Normal MRI with no signs of atrophy or demyelination.   pituitary gland is shaped a little abnormal but dimensions are normal with no masses. You have never been told anything was wrong with pituitary gland. TSH is normal but we could test other pituitary hormones to confirm not over secreting.

## 2017-08-12 ENCOUNTER — Encounter: Payer: Self-pay | Admitting: Physician Assistant

## 2017-08-12 ENCOUNTER — Ambulatory Visit (INDEPENDENT_AMBULATORY_CARE_PROVIDER_SITE_OTHER): Payer: 59 | Admitting: Physician Assistant

## 2017-08-12 VITALS — BP 115/80 | HR 82 | Ht 64.0 in | Wt 126.0 lb

## 2017-08-12 DIAGNOSIS — R202 Paresthesia of skin: Secondary | ICD-10-CM

## 2017-08-12 DIAGNOSIS — R200 Anesthesia of skin: Secondary | ICD-10-CM

## 2017-08-12 NOTE — Patient Instructions (Addendum)
Teresa Garcia    Chronic Fatigue Syndrome Chronic fatigue syndrome (CFS) is a condition that causes extreme tiredness (fatigue). This fatigue does not improve with rest, and it gets worse with physical or mental activity. You may have several other symptoms along with fatigue. Symptoms may come and go, but they generally last for months. Sometimes, CFS gets better over time, but it can be a lifelong condition. There is no cure, but there are many possible treatments. You will need to work with your health care providers to find a treatment plan that works best for you. What are the causes? The cause of CFS is not known. There may be more than one cause. Possible causes include:  An infection.  An abnormal body defense system (immune system).  Low blood pressure.  Poor diet.  Physical or emotional stress.  What increases the risk? You are more likely to develop this condition if:  You are female.  You are 40?36 years old.  You have a family history of CFS.  You live with a lot of emotional stress.  What are the signs or symptoms? The main symptom of CFS is fatigue that is severe enough to interfere with day-to-day activities. This fatigue does not get better with rest, and it gets worse with physical or mental activity. There are eight other major symptoms of CFS:  Lack of energy (malaise) that lasts more than 24 hours after physical exertion.  Sleep that does not relieve fatigue (unrefreshing sleep).  Short-term memory loss or confusion.  Joint pain without redness or swelling.  Muscle aches.  Headaches.  Painful and swollen glands (lymph nodes) in the neck or under the arms.  Sore throat.  You may also have:  Abdominal cramps, constipation, or diarrhea (irritable bowel).  Chills.  Night sweats.  Vision changes.  Dizziness.  Mental confusion (brain fog).  Clumsiness.  Sensitivity to food, noise, or odors.  Mood swings, depression, or anxiety  attacks.  How is this diagnosed? There are no tests that can diagnose this condition. Your health care provider will make the diagnosis based on your medical history, a physical exam, and a mental health exam. However, it is important to make sure that your symptoms are not caused by another medical condition. You may have lab tests or X-rays to rule out other conditions. For your health care provider to diagnose CFS:  You must have had fatigue for at least 6 straight months.  Fatigue must be your first symptom, and it must be severe enough to interfere with day-to-day activities.  There must be no other cause found for the fatigue.  You must also have at least four of the eight other major symptoms of CFS.  How is this treated? There is no cure for CFS. The condition affects everyone differently. You will need to work with your team of health care providers to find the best treatments for your symptoms. Your team may include your primary care provider, physical and exercise therapists, and mental health therapists. Treatment may include:  Improving sleep with a regular bedtime routine.  Avoiding caffeine, alcohol, and tobacco.  Doing light exercise and stretching during the day.  Taking medicines to help you sleep or to relieve joint or muscle pain.  Learning and practicing relaxation techniques.  Using memory aids or doing brainteasers to improve memory and concentration.  Seeing a mental health therapist to evaluate and treat depression, if necessary.  Trying massage therapy, acupuncture, and movement exercises, such as yoga or tai   chi.  Follow these instructions at home:  Activity  Exercise regularly, as told by your health care provider.  Avoid fatigue by pacing yourself during the day and getting enough sleep at night.  Go to bed and get up at the same time every day. Eating and drinking  Avoid caffeine and alcohol.  Avoid heavy meals in the evening.  Eat a  well-balanced diet. General instructions  Take over-the-counter and prescription medicines only as told by your health care provider.  Do not use herbal or dietary supplements unless they are approved by your health care provider.  Maintain a healthy weight.  Avoid stress and use stress-reducing techniques that you learn in therapy.  Do not use any products that contain nicotine or tobacco, such as cigarettes and e-cigarettes. If you need help quitting, ask your health care provider.  Consider joining a CFS support group.  Keep all follow-up visits as told by your health care provider. This is important. Contact a health care provider if:  Your symptoms do not get better or they get worse.  You feel angry, guilty, anxious, or depressed. This information is not intended to replace advice given to you by your health care provider. Make sure you discuss any questions you have with your health care provider. Document Released: 07/11/2004 Document Revised: 02/08/2016 Document Reviewed: 09/11/2015 Elsevier Interactive Patient Education  [employer].

## 2017-08-12 NOTE — Progress Notes (Signed)
   Subjective:    Patient ID: Teresa Garcia, female    DOB: 1981-04-27, 37 y.o.   MRN: [id number]  HPI  Pt is a 37 yo female with hx of myaglias, fatigue and depression who comes in to discuss MRI for bilateral numbness and tingling waist down that is intermittent. She continues to have these symptoms. Always worse when she lays on her back. She reports depression is well controlled and doing great. Tried cymbalta in the past and just made her more exhausted. She is tired all day long. She can go home at night and sleep for 3 hours and then sleep all night. She denies any increase in pain/cramping when walking. No swelling noted. All metabolic labs are normal. Lumbar xray showed no acute changes, disc space narrowing. She comes in for next step.   .. Active Ambulatory Problems    Diagnosis Date Noted  . Myalgia and myositis 02/20/2016  . Decreased iron stores 02/21/2016  . Cervical paraspinal muscle spasm 03/29/2016  . Abnormal weight gain 06/06/2016  . Tonsil stone 08/05/2016  . Ecchymosis 01/20/2017  . Depression, recurrent (HCC) 03/26/2017  . Cubital tunnel syndrome on right 05/02/2017  . Mixed irritable bowel syndrome 05/29/2017  . Dysphagia 08/04/2017  . Chronic bilateral low back pain without sciatica 08/04/2017  . RLS (restless legs syndrome) 08/04/2017  . Numbness and tingling 08/04/2017  . Raynaud's disease without gangrene 08/04/2017   Resolved Ambulatory Problems    Diagnosis Date Noted  . Bilateral leg pain 02/20/2016  . No energy 02/20/2016  . Right knee pain 02/20/2016  . Inattention 07/15/2016  . Anxiety 09/25/2016  . Forgetfulness 09/25/2016  . Mood changes 03/27/2017  . Fullness of neck 08/04/2017   No Additional Past Medical History     Review of Systems  All other systems reviewed and are negative.      Objective:   Physical Exam  Constitutional: She is oriented to person, place, and time. She appears well-developed and well-nourished.  HENT:   Head: Normocephalic and atraumatic.  Cardiovascular: Normal rate, regular rhythm and normal heart sounds.  Musculoskeletal:  Upper and lower extremity strength 5/5.   Neurological: She is alert and oriented to person, place, and time. She has normal reflexes. She displays normal reflexes. No cranial nerve deficit. She exhibits normal muscle tone. Coordination normal.  Psychiatric: She has a normal mood and affect. Her behavior is normal.          Assessment & Plan:  Marland Kitchen.Marland Kitchen.Teresa Garcia was seen today for follow-up.  Diagnoses and all orders for this visit:  Numbness and tingling of both lower extremities -     Ambulatory referral to Neurology -     NCV with EMG(electromyography)  Paresthesia -     Ambulatory referral to Neurology -     NCV with EMG(electromyography)  discussed normal brain MRI for any demyelinating process.   Unclear etiology. Consulted with supervising physician and agreed with plan to proceed with EMG and neurology referral.  Pt's depression is well controlled and [facility]. She refuses lyrica and gabapentin due to weight gain side effects.  Work up with lumbar xray/labs/brain MRI have all been normal.   ..Spent 30 minutes with patient and greater than 50 percent of visit spent counseling patient regarding treatment plan.

## 2017-08-14 ENCOUNTER — Telehealth: Payer: Self-pay | Admitting: Physician Assistant

## 2017-08-14 NOTE — Telephone Encounter (Signed)
Pt [facility] her [facility] with Dr. Burnett CorrenteEdward Hill. [phone]. Thanks

## 2017-08-15 ENCOUNTER — Encounter: Payer: Self-pay | Admitting: Family Medicine

## 2017-08-15 ENCOUNTER — Ambulatory Visit (INDEPENDENT_AMBULATORY_CARE_PROVIDER_SITE_OTHER): Payer: 59 | Admitting: Family Medicine

## 2017-08-15 ENCOUNTER — Encounter: Payer: Self-pay | Admitting: Physician Assistant

## 2017-08-15 VITALS — BP 109/76 | HR 78 | Temp 98.6°F | Ht 64.0 in | Wt 128.0 lb

## 2017-08-15 DIAGNOSIS — J069 Acute upper respiratory infection, unspecified: Secondary | ICD-10-CM | POA: Diagnosis not present

## 2017-08-15 DIAGNOSIS — R202 Paresthesia of skin: Principal | ICD-10-CM

## 2017-08-15 DIAGNOSIS — R200 Anesthesia of skin: Secondary | ICD-10-CM | POA: Insufficient documentation

## 2017-08-15 MED ORDER — PREDNISONE 10 MG PO TABS: 30.0000 mg | ORAL_TABLET | Freq: Every day | ORAL | 0 refills | Status: DC

## 2017-08-15 MED ORDER — IPRATROPIUM BROMIDE 0.06 % NA SOLN: 2.0000 | NASAL | 6 refills | Status: DC | PRN

## 2017-08-15 MED ORDER — AZITHROMYCIN 250 MG PO TABS: 250.0000 mg | ORAL_TABLET | Freq: Every day | ORAL | 0 refills | Status: DC

## 2017-08-15 NOTE — Telephone Encounter (Signed)
Referral placed for neurology and EMG.

## 2017-08-15 NOTE — Patient Instructions (Signed)
Thank you for coming in today. Continue tylenol for pain and fever., Max dose Is  1000mg  every 6 hours.  Be careful with combo cold medicine that often contain acetaminophen.. Ibuprofen is ok too. The max dose is 600-800 mg every 8 hours.  Do not take with meloxicam.  Use the nasal spray.  Use back up prednisone if not better.  Use back up azithromycin antibiotic if worse.   Let me know if you need tessalon for cough.   Recheck as needed.    Upper Respiratory Infection, Adult Most upper respiratory infections (URIs) are a viral infection of the air passages leading to the lungs. A URI affects the nose, throat, and upper air passages. The most common type of URI is nasopharyngitis and is typically referred to as "the common cold." URIs run their course and usually go away on their own. Most of the time, a URI does not require medical attention, but sometimes a bacterial infection in the upper airways can follow a viral infection. This is called a secondary infection. Sinus and middle ear infections are common types of secondary upper respiratory infections. Bacterial pneumonia can also complicate a URI. A URI can worsen asthma and chronic obstructive pulmonary disease (COPD). Sometimes, these complications can require emergency medical care and may be life threatening. What are the causes? Almost all URIs are caused by viruses. A virus is a type of germ and can spread from one person to another. What increases the risk? You may be at risk for a URI if:  You smoke.  You have chronic heart or lung disease.  You have a weakened defense (immune) system.  You are very young or very old.  You have nasal allergies or asthma.  You work in crowded or poorly ventilated areas.  You work in health care facilities or schools.  What are the signs or symptoms? Symptoms typically develop 2-3 days after you come in contact with a cold virus. Most viral URIs last 7-10 days. However, viral URIs from  the influenza virus (flu virus) can last 14-18 days and are typically more severe. Symptoms may include:  Runny or stuffy (congested) nose.  Sneezing.  Cough.  Sore throat.  Headache.  Fatigue.  Fever.  Loss of appetite.  Pain in your forehead, behind your eyes, and over your cheekbones (sinus pain).  Muscle aches.  How is this diagnosed? Your health care provider may diagnose a URI by:  Physical exam.  Tests to check that your symptoms are not due to another condition such as: ? Strep throat. ? Sinusitis. ? Pneumonia. ? Asthma.  How is this treated? A URI goes away on its own with time. It cannot be cured with medicines, but medicines may be prescribed or recommended to relieve symptoms. Medicines may help:  Reduce your fever.  Reduce your cough.  Relieve nasal congestion.  Follow these instructions at home:  Take medicines only as directed by your health care provider.  Gargle warm saltwater or take cough drops to comfort your throat as directed by your health care provider.  Use a warm mist humidifier or inhale steam from a shower to increase air moisture. This may make it easier to breathe.  Drink enough fluid to keep your urine clear or pale yellow.  Eat soups and other clear broths and maintain good nutrition.  Rest as needed.  Return to work when your temperature has returned to normal or as your health care provider advises. You may need to stay home  longer to avoid infecting others. You can also use a face mask and careful hand washing to prevent spread of the virus.  Increase the usage of your inhaler if you have asthma.  Do not use any tobacco products, including cigarettes, chewing tobacco, or electronic cigarettes. If you need help quitting, ask your health care provider. How is this prevented? The best way to protect yourself from getting a cold is to practice good hygiene.  Avoid oral or hand contact with people with cold  symptoms.  Wash your hands often if contact occurs.  There is no clear evidence that vitamin C, vitamin E, echinacea, or exercise reduces the chance of developing a cold. However, it is always recommended to get plenty of rest, exercise, and practice good nutrition. Contact a health care provider if:  You are getting worse rather than better.  Your symptoms are not controlled by medicine.  You have chills.  You have worsening shortness of breath.  You have brown or red mucus.  You have yellow or brown nasal discharge.  You have pain in your face, especially when you bend forward.  You have a fever.  You have swollen neck glands.  You have pain while swallowing.  You have white areas in the back of your throat. Get help right away if:  You have severe or persistent: ? Headache. ? Ear pain. ? Sinus pain. ? Chest pain.  You have chronic lung disease and any of the following: ? Wheezing. ? Prolonged cough. ? Coughing up blood. ? A change in your usual mucus.  You have a stiff neck.  You have changes in your: ? Vision. ? Hearing. ? Thinking. ? Mood. This information is not intended to replace advice given to you by your health care provider. Make sure you discuss any questions you have with your health care provider. Document Released: 11/27/2000 Document Revised: 02/04/2016 Document Reviewed: 09/08/2013 Elsevier Interactive Patient Education  [employer].

## 2017-08-15 NOTE — Progress Notes (Signed)
Teresa Garcia is a 37 y.o. female who presents to [facility] today for congestion sore throat nasal discharge pressure and mild cough.  Alyanna notes a several day history of sore throat nasal discharge congestion and generally feeling ill.  She denies severe body aches fevers or chills.  She notes she developed worsening sore throat and some bilateral sinus pressure yesterday.  She is worried that she will get sicker.  She received a flu vaccine this year but notes that she has had multiple sick contacts with viral illness recently.  She works check in at a medical office.  Several of her coworkers have recently been ill as well.   Past Medical History:  Diagnosis Date  . Chronic bilateral low back pain without sciatica 08/04/2017  . Cubital tunnel syndrome on right 05/02/2017  . Depression, recurrent (HCC) 03/26/2017   04/08/2017 PHQ9 = 17, GAD7 = 18 05/29/2017 PHQ 9 = 2, GAD 7 = 4  . Mixed irritable bowel syndrome 05/29/2017  . Raynaud's disease without gangrene 08/04/2017  . RLS (restless legs syndrome) 08/04/2017   Past Surgical History:  Procedure Laterality Date  . TUBAL LIGATION     Social History   Tobacco Use  . Smoking status: Current Every Day Smoker    Packs/day: 0.25    Types: E-cigarettes  . Smokeless tobacco: Never Used  Substance Use Topics  . Alcohol use: Yes    Comment: Socially   family history includes Diabetes in her maternal grandmother and paternal grandmother.  ROS as above:  Medications: Current Outpatient Medications  Medication Sig Dispense Refill  . cyclobenzaprine (FLEXERIL) 10 MG tablet One half tab PO qHS, then increase gradually to one tab TID. 30 tablet 0  . meloxicam (MOBIC) 15 MG tablet One tab PO qAM with breakfast for 2 weeks, then daily prn pain. 30 tablet 3  . azithromycin (ZITHROMAX) 250 MG tablet Take 1 tablet  (250 mg total) by mouth daily. Take first 2 tablets together, then 1 every day until finished. 6 tablet 0  . ipratropium (ATROVENT) 0.06 % nasal spray Place 2 sprays into both nostrils every 4 (four) hours as needed for rhinitis. 10 mL 6  . predniSONE (DELTASONE) 10 MG tablet Take 3 tablets (30 mg total) by mouth daily with breakfast. 15 tablet 0   No current facility-administered medications for this visit.    Allergies  Allergen Reactions  . Cymbalta [Duloxetine Hcl]     Made exhaustion worse.   Dustin Flock [Vortioxetine]     Increasing IBS symptoms.   . Wellbutrin [Bupropion] Other (See Comments)    Worsening of symptoms    Health Maintenance Health Maintenance  Topic Date Due  . TETANUS/TDAP  03/27/2018 (Originally 01/03/2001)  . PAP SMEAR  05/03/2019  . INFLUENZA VACCINE  Completed  . HIV Screening  Completed     Exam:  BP 109/76   Pulse 78   Temp 98.6 F (37 C) (Oral)   Ht 5\' 4"  (1.626 m)   Wt 128 lb (58.1 kg)   BMI 21.97 kg/m  Gen: Well NAD HEENT: EOMI,  MMM clear nasal discharge with mild inflamed nasal turbinates bilaterally.  Posterior pharynx with cobblestoning minimal erythema no exudate.  Normal tympanic membranes bilaterally.  Mild cervical lymphadenopathy present bilaterally. Lungs: Normal work of breathing. CTABL Heart: RRR no MRG Abd: NABS, Soft. Nondistended, Nontender Exts: Brisk capillary refill, warm and well perfused.    No results found  for this or any previous visit (from the past 72 hour(s)). No results found.    Assessment and Plan: 37 y.o. female with viral URI with possible early second sickening.  We discussed treatment options.  Plan to continue over-the-counter medications for pain fevers chills.  Will add Atrovent nasal spray for nasal discharge and postnasal drainage.  Will use prednisone as a backup if not better and azithromycin as a backup if not worse.  I offered [facility] cough patient elects to wait on that  and will let me know if we need any.   No orders of the defined types were placed in this encounter.  Meds ordered this encounter  Medications  . ipratropium (ATROVENT) 0.06 % nasal spray    Sig: Place 2 sprays into both nostrils every 4 (four) hours as needed for rhinitis.    Dispense:  10 mL    Refill:  6  . predniSONE (DELTASONE) 10 MG tablet    Sig: Take 3 tablets (30 mg total) by mouth daily with breakfast.    Dispense:  15 tablet    Refill:  0  . azithromycin (ZITHROMAX) 250 MG tablet    Sig: Take 1 tablet (250 mg total) by mouth daily. Take first 2 tablets together, then 1 every day until finished.    Dispense:  6 tablet    Refill:  0     Discussed warning signs or symptoms. Please see discharge instructions. Patient expresses understanding.

## 2017-08-20 ENCOUNTER — Ambulatory Visit: Payer: 59 | Admitting: Physician Assistant

## 2017-08-21 ENCOUNTER — Encounter: Payer: Self-pay | Admitting: Physician Assistant

## 2017-08-21 DIAGNOSIS — G609 Hereditary and idiopathic neuropathy, unspecified: Secondary | ICD-10-CM | POA: Diagnosis not present

## 2017-08-21 DIAGNOSIS — E531 Pyridoxine deficiency: Secondary | ICD-10-CM | POA: Diagnosis not present

## 2017-08-21 DIAGNOSIS — Z79899 Other long term (current) drug therapy: Secondary | ICD-10-CM | POA: Diagnosis not present

## 2017-08-21 DIAGNOSIS — R208 Other disturbances of skin sensation: Secondary | ICD-10-CM | POA: Diagnosis not present

## 2017-08-21 DIAGNOSIS — R202 Paresthesia of skin: Secondary | ICD-10-CM | POA: Diagnosis not present

## 2017-08-21 DIAGNOSIS — E559 Vitamin D deficiency, unspecified: Secondary | ICD-10-CM | POA: Diagnosis not present

## 2017-08-21 DIAGNOSIS — M791 Myalgia, unspecified site: Secondary | ICD-10-CM | POA: Diagnosis not present

## 2017-08-21 LAB — VITAMIN D 25 HYDROXY (VIT D DEFICIENCY, FRACTURES): Vit D, 25-Hydroxy: 31

## 2017-08-21 LAB — HEMOGLOBIN A1C: Hemoglobin A1C: 5.5

## 2017-08-26 LAB — VITAMIN B6: Vitamin B6: 29.4

## 2017-08-26 LAB — ANA
ANA BY IFA SDFS: NEGATIVE
CYCLIC CITRULLIN PEPTIDE AB: NORMAL
RHEUMATOID FACTOR (IGG): NORMAL

## 2017-08-31 ENCOUNTER — Other Ambulatory Visit: Payer: Self-pay | Admitting: Physician Assistant

## 2017-08-31 DIAGNOSIS — G2581 Restless legs syndrome: Secondary | ICD-10-CM

## 2017-09-01 NOTE — Telephone Encounter (Signed)
I do not see this med on med list. Looks like Dr. Denyse Amassorey discontiued at some point. Please advise if ok to fill. KG LPN

## 2017-09-04 DIAGNOSIS — R208 Other disturbances of skin sensation: Secondary | ICD-10-CM | POA: Diagnosis not present

## 2017-09-04 DIAGNOSIS — M542 Cervicalgia: Secondary | ICD-10-CM | POA: Diagnosis not present

## 2017-09-04 DIAGNOSIS — R202 Paresthesia of skin: Secondary | ICD-10-CM | POA: Diagnosis not present

## 2017-09-04 DIAGNOSIS — M791 Myalgia, unspecified site: Secondary | ICD-10-CM | POA: Diagnosis not present

## 2017-09-22 ENCOUNTER — Ambulatory Visit: Payer: 59 | Admitting: Sports Medicine

## 2017-09-23 ENCOUNTER — Telehealth: Payer: Self-pay

## 2017-09-23 NOTE — Telephone Encounter (Signed)
PT needs nausea patch for upcoming trip

## 2017-09-24 MED ORDER — SCOPOLAMINE 1 MG/3DAYS TD PT72: 1.0000 | MEDICATED_PATCH | TRANSDERMAL | 0 refills | Status: DC

## 2017-09-24 NOTE — Telephone Encounter (Signed)
Sent to pharmacy 

## 2017-09-28 ENCOUNTER — Other Ambulatory Visit: Payer: Self-pay | Admitting: Physician Assistant

## 2017-09-28 DIAGNOSIS — G2581 Restless legs syndrome: Secondary | ICD-10-CM

## 2017-09-29 NOTE — Telephone Encounter (Signed)
Let wait until she gets back from vacation and ask her if still taking?

## 2017-09-29 NOTE — Telephone Encounter (Signed)
[facility] requesting a refill on Ropinirole.   Med was removed from med list on 08-15-17 but I could not see in note where this was discussed.  Can you please review to make sure this was supposed to be removed?  Thanks!

## 2017-10-06 NOTE — Telephone Encounter (Signed)
Spoke with pt, no longer taking this medication. If she ever wants to restart/refill, she will let provider know

## 2017-10-08 ENCOUNTER — Other Ambulatory Visit: Payer: Self-pay | Admitting: *Deleted

## 2017-10-15 ENCOUNTER — Ambulatory Visit: Payer: 59 | Admitting: Physician Assistant

## 2017-10-21 ENCOUNTER — Encounter: Payer: Self-pay | Admitting: Physician Assistant

## 2017-11-05 ENCOUNTER — Encounter: Payer: Self-pay | Admitting: Physician Assistant

## 2017-11-05 ENCOUNTER — Ambulatory Visit (INDEPENDENT_AMBULATORY_CARE_PROVIDER_SITE_OTHER): Payer: 59 | Admitting: Physician Assistant

## 2017-11-05 VITALS — BP 97/62 | HR 74 | Ht 64.0 in | Wt 131.0 lb

## 2017-11-05 DIAGNOSIS — R5382 Chronic fatigue, unspecified: Secondary | ICD-10-CM

## 2017-11-05 DIAGNOSIS — G4710 Hypersomnia, unspecified: Secondary | ICD-10-CM

## 2017-11-05 DIAGNOSIS — G478 Other sleep disorders: Secondary | ICD-10-CM | POA: Diagnosis not present

## 2017-11-05 DIAGNOSIS — G9332 Myalgic encephalomyelitis/chronic fatigue syndrome: Secondary | ICD-10-CM

## 2017-11-05 MED ORDER — AMPHETAMINE-DEXTROAMPHET ER 20 MG PO CP24: 20.0000 mg | ORAL_CAPSULE | ORAL | 0 refills | Status: DC

## 2017-11-06 DIAGNOSIS — R5382 Chronic fatigue, unspecified: Secondary | ICD-10-CM | POA: Insufficient documentation

## 2017-11-06 DIAGNOSIS — G9332 Myalgic encephalomyelitis/chronic fatigue syndrome: Secondary | ICD-10-CM | POA: Insufficient documentation

## 2017-11-06 NOTE — Progress Notes (Signed)
Subjective:    Patient ID: Teresa Garcia, female    DOB: [birth year], 37 y.o.   MRN: [id number]  HPI  Pt is a 37 yo female who presents to the clinic to follow up on extreme fatigue. She has had fatigue for "years". She states underlying fatigue since she was in her early 20's. She has been worked up for autoimmune, hormonal, and neurological issues. So far everything has been negative. She has not had a sleep study. She denies any snoring. She does not wake up rested. She comes in today concerned and frustrated. She went to bed last night at 9:45 and slept through all 5 of her alarms and was late for work. This was after a 1 hour nap when she got home. She has absolutely no motivation or energy. She denies any depression or anxiety. She has tried numerous anti-depressants and they just make things worse.   .. Active Ambulatory Problems    Diagnosis Date Noted  . Myalgia and myositis 02/20/2016  . Decreased iron stores 02/21/2016  . Cervical paraspinal muscle spasm 03/29/2016  . Abnormal weight gain 06/06/2016  . Tonsil stone 08/05/2016  . Ecchymosis 01/20/2017  . Depression, recurrent (HCC) 03/26/2017  . Cubital tunnel syndrome on right 05/02/2017  . Mixed irritable bowel syndrome 05/29/2017  . Dysphagia 08/04/2017  . Chronic bilateral low back pain without sciatica 08/04/2017  . RLS (restless legs syndrome) 08/04/2017  . Paresthesia 08/04/2017  . Raynaud's disease without gangrene 08/04/2017  . Chronic fatigue syndrome 11/06/2017   Resolved Ambulatory Problems    Diagnosis Date Noted  . Bilateral leg pain 02/20/2016  . No energy 02/20/2016  . Right knee pain 02/20/2016  . Inattention 07/15/2016  . Anxiety 09/25/2016  . Forgetfulness 09/25/2016  . Mood changes 03/27/2017  . Fullness of neck 08/04/2017  . Numbness and tingling of both lower extremities 08/15/2017   Past Medical History:  Diagnosis Date  . Chronic bilateral low back pain without sciatica 08/04/2017  . Cubital  tunnel syndrome on right 05/02/2017  . Depression, recurrent (HCC) 03/26/2017  . Mixed irritable bowel syndrome 05/29/2017  . Raynaud's disease without gangrene 08/04/2017  . RLS (restless legs syndrome) 08/04/2017      Review of Systems See HPI.     Objective:   Physical Exam  Constitutional: She is oriented to person, place, and time. She appears well-developed and well-nourished.  HENT:  Head: Normocephalic and atraumatic.  Cardiovascular: Normal rate and regular rhythm.  Neurological: She is alert and oriented to person, place, and time.  Psychiatric: She has a normal mood and affect. Her behavior is normal.          Assessment & Plan:  Marland KitchenMarland KitchenDiagnoses and all orders for this visit:  Chronic fatigue syndrome -     amphetamine-dextroamphetamine (ADDERALL XR) 20 MG 24 hr capsule; Take 1 capsule (20 mg total) by mouth every morning. -     Home sleep test  Non-restorative sleep -     Home sleep test  Hypersomnolence -     Home sleep test   Pt has struggled for many years. We have tried numerous medications. She felt the best she has ever felt on phentermine. Other than sleep study patient has had labs, EMG's, referrals, medication trials with no benefit. Will try a stimulant. Follow up in one month. Discussed side effects from stimulants.   Pt does not have any OSA symptoms but she does have non-restorative sleep with fatigue without depression. I would like to evaluate  with sleep study.

## 2017-11-07 DIAGNOSIS — G478 Other sleep disorders: Secondary | ICD-10-CM | POA: Insufficient documentation

## 2017-11-07 DIAGNOSIS — G4710 Hypersomnia, unspecified: Secondary | ICD-10-CM | POA: Insufficient documentation

## 2017-11-17 ENCOUNTER — Ambulatory Visit: Payer: 59 | Admitting: Physician Assistant

## 2017-12-03 ENCOUNTER — Encounter: Payer: Self-pay | Admitting: Physician Assistant

## 2017-12-03 ENCOUNTER — Ambulatory Visit (INDEPENDENT_AMBULATORY_CARE_PROVIDER_SITE_OTHER): Payer: 59 | Admitting: Physician Assistant

## 2017-12-03 DIAGNOSIS — R5382 Chronic fatigue, unspecified: Secondary | ICD-10-CM | POA: Diagnosis not present

## 2017-12-03 DIAGNOSIS — G9332 Myalgic encephalomyelitis/chronic fatigue syndrome: Secondary | ICD-10-CM

## 2017-12-03 MED ORDER — AMPHETAMINE-DEXTROAMPHET ER 20 MG PO CP24: 20.0000 mg | ORAL_CAPSULE | ORAL | 0 refills | Status: DC

## 2017-12-03 NOTE — Progress Notes (Signed)
Subjective:    Patient ID: Teresa Garcia, female    DOB: Nov 01, 1980, 37 y.o.   MRN: [id number]  HPI  Patient is a 37 year old female with chronic fatigue syndrome who presents to the clinic to follow-up after starting Adderall 20 mg.  She denies any side effects to medication.  She denies any palpitations, headaches, vision changes, insomnia, increase in anxiety.  She has noticed a little decrease in appetite.  Her BMI today is 21.9 and she has lost 4 pounds in 1 month.  She has noticed that her overall focus and energy has improved very much.  She feels like she does not have to take a nap every day when she gets home.  She has been able to wake up in the morning and get things done.  Her kids and family have noticed the difference and she is very happy with this medication.  .. Active Ambulatory Problems    Diagnosis Date Noted  . Myalgia and myositis 02/20/2016  . Decreased iron stores 02/21/2016  . Cervical paraspinal muscle spasm 03/29/2016  . Abnormal weight gain 06/06/2016  . Tonsil stone 08/05/2016  . Ecchymosis 01/20/2017  . Depression, recurrent (HCC) 03/26/2017  . Cubital tunnel syndrome on right 05/02/2017  . Mixed irritable bowel syndrome 05/29/2017  . Dysphagia 08/04/2017  . Chronic bilateral low back pain without sciatica 08/04/2017  . RLS (restless legs syndrome) 08/04/2017  . Paresthesia 08/04/2017  . Raynaud's disease without gangrene 08/04/2017  . Chronic fatigue syndrome 11/06/2017  . Non-restorative sleep 11/07/2017  . Hypersomnolence 11/07/2017   Resolved Ambulatory Problems    Diagnosis Date Noted  . Bilateral leg pain 02/20/2016  . No energy 02/20/2016  . Right knee pain 02/20/2016  . Inattention 07/15/2016  . Anxiety 09/25/2016  . Forgetfulness 09/25/2016  . Mood changes 03/27/2017  . Fullness of neck 08/04/2017  . Numbness and tingling of both lower extremities 08/15/2017   Past Medical History:  Diagnosis Date  . Chronic bilateral low back pain  without sciatica 08/04/2017  . Cubital tunnel syndrome on right 05/02/2017  . Depression, recurrent (HCC) 03/26/2017  . Mixed irritable bowel syndrome 05/29/2017  . Raynaud's disease without gangrene 08/04/2017  . RLS (restless legs syndrome) 08/04/2017     Review of Systems  All other systems reviewed and are negative.      Objective:   Physical Exam  Constitutional: She is oriented to person, place, and time. She appears well-developed and well-nourished.  HENT:  Head: Normocephalic and atraumatic.  Cardiovascular: Normal rate and regular rhythm.  Pulmonary/Chest: Effort normal and breath sounds normal.  Neurological: She is alert and oriented to person, place, and time.  Psychiatric: She has a normal mood and affect. Her behavior is normal.          Assessment & Plan:  Marland KitchenMarland KitchenKathleen was seen today for follow-up.  Diagnoses and all orders for this visit:  Chronic fatigue syndrome -     amphetamine-dextroamphetamine (ADDERALL XR) 20 MG 24 hr capsule; Take 1 capsule (20 mg total) by mouth every morning.  Other orders -     amphetamine-dextroamphetamine (ADDERALL XR) 20 MG 24 hr capsule; Take 1 capsule (20 mg total) by mouth every morning. -     amphetamine-dextroamphetamine (ADDERALL XR) 20 MG 24 hr capsule; Take 1 capsule (20 mg total) by mouth every morning.   Patient is doing well on Adderall 20 mg.  Refilled for 3 months.  She did have a slight weight loss.  She will need to monitor  this to make sure she does not fall underweight.  Continue to do things that promote good sleep and energy.  She does have a sleep study scheduled for next Wednesday.

## 2017-12-05 ENCOUNTER — Encounter: Payer: Self-pay | Admitting: Physician Assistant

## 2017-12-05 DIAGNOSIS — G9332 Myalgic encephalomyelitis/chronic fatigue syndrome: Secondary | ICD-10-CM

## 2017-12-05 DIAGNOSIS — R5382 Chronic fatigue, unspecified: Secondary | ICD-10-CM

## 2017-12-05 MED ORDER — AMPHETAMINE-DEXTROAMPHET ER 20 MG PO CP24: 20.0000 mg | ORAL_CAPSULE | ORAL | 0 refills | Status: DC

## 2017-12-05 MED ORDER — AMPHETAMINE-DEXTROAMPHET ER 20 MG PO CP24
20.0000 mg | ORAL_CAPSULE | ORAL | 0 refills | Status: DC
Start: 2017-12-05 — End: 2018-02-25

## 2017-12-05 MED FILL — ADDERALL XR 20 MG CAP SA: 20 | 30 days supply | Qty: 30 | Fill #0

## 2017-12-10 ENCOUNTER — Encounter (HOSPITAL_BASED_OUTPATIENT_CLINIC_OR_DEPARTMENT_OTHER): Payer: 59

## 2017-12-31 ENCOUNTER — Ambulatory Visit (INDEPENDENT_AMBULATORY_CARE_PROVIDER_SITE_OTHER): Payer: 59 | Admitting: Family Medicine

## 2017-12-31 ENCOUNTER — Encounter: Payer: Self-pay | Admitting: Family Medicine

## 2017-12-31 VITALS — BP 104/68 | HR 90 | Ht 64.25 in | Wt 129.0 lb

## 2017-12-31 DIAGNOSIS — G5601 Carpal tunnel syndrome, right upper limb: Secondary | ICD-10-CM | POA: Diagnosis not present

## 2017-12-31 DIAGNOSIS — M654 Radial styloid tenosynovitis [de Quervain]: Secondary | ICD-10-CM | POA: Diagnosis not present

## 2017-12-31 DIAGNOSIS — G5621 Lesion of ulnar nerve, right upper limb: Secondary | ICD-10-CM

## 2017-12-31 DIAGNOSIS — G5600 Carpal tunnel syndrome, unspecified upper limb: Secondary | ICD-10-CM | POA: Insufficient documentation

## 2017-12-31 MED ORDER — DICLOFENAC SODIUM 1 % TD GEL: 2.0000 g | Freq: Four times a day (QID) | TRANSDERMAL | 11 refills | Status: DC

## 2017-12-31 NOTE — Patient Instructions (Addendum)
Thank you for coming in today. Use a thumb spica splint on your wrist for DeQuervain as needed.  USe the diclofenac gel up to 4x daily.  Use the night splint for carpal tunnel.  Continue treatment for cubital tunnel syndrome.  Recheck as needed.   Next step is injection.

## 2017-12-31 NOTE — Progress Notes (Signed)
Teresa Garcia is a 37 y.o. female who [facility] today for right hand and wrist pain.  Teresa Garcia notes a several month history of right hand numbness.  She describes numbness into her fifth and fourth digits.  She was seen for this in November and thought to have cubital tunnel syndrome.  She has been using bulky splints and notes that is still present mildly but significantly improved.  However she notes new onset numbness and tingling into the first 3 digits of her right hand.  This is been ongoing for a few weeks.  She is done some research and thought to have carpal tunnel syndrome.  She has been using a night splint which has significantly improved her symptoms.  There is still present but much better over the last several days with a night splint.  She denies any weakness.  She has not had much of the treatment for this.  Additionally she notes pain in her right radial wrist.  She points to the radial styloid as the area of maximal pain.  She notes pain is worse with wrist motion especially ulnar and radial deviation.  This is been ongoing for a few weeks without injury.  She has not had much treatment for this yet.  She is tried some over-the-counter medicines which have not helped.  She denies any injury for any of these issues.  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  Additionally she is going to school and doing lots of writing and thinks that may be a factor as well.    ROS:  As above  Exam:  BP 104/68   Pulse 90   Ht 5' 4.25" (1.632 m)   Wt 129 lb (58.5 kg)   BMI 21.97 kg/m  General: Well Developed, well nourished, and in no acute distress.  Neuro/Psych: Alert and oriented x3, extra-ocular muscles intact, able to move all 4 extremities, sensation grossly intact. Skin: Warm and dry, no rashes noted.  Respiratory: Not using accessory muscles, speaking in full  sentences, trachea midline.  Cardiovascular: Pulses palpable, no extremity edema. Abdomen: Does not appear distended. MSK:  C-spine normal-appearing normal motion.  Right elbow normal-appearing mildly tender at the cubital tunnel.  Mildly positive Tinel's test. Normal elbow motion and strength.  Right wrist normal-appearing. Normal motion. Tender to palpation at radial styloid. Nontender otherwise. Tinel's test at carpal tunnel minimally positive. Positive Finkelstein's test. Wrist strength sensation pulses and capillary refill are intact.      Assessment and Plan: 37 y.o. female with  Carpal tunnel syndrome improving with night splint.  Plan to continue conservative management watchful waiting.  Next step if not improved is injection.  De Quervain's tenosynovitis present.  Discussed thumb spica splinting as needed along with home exercise program and diclofenac gel.  If not better next step would be injection.  Cubital tunnel syndrome: Improving.  Watchful waiting continue bulky night splint.  I am hopeful that these issues will improve when she finishes school in the near future.    No orders of the defined types were placed in this encounter.  Meds ordered this encounter  Medications  . diclofenac sodium (VOLTAREN) 1 % GEL    Sig: Apply 2 g topically 4 (four) times daily. To affected joint.    Dispense:  100 g    Refill:  11    Historical information moved to improve visibility  of documentation.  Past Medical History:  Diagnosis Date  . Chronic bilateral low back pain without sciatica 08/04/2017  . Cubital tunnel syndrome on right 05/02/2017  . Depression, recurrent (HCC) 03/26/2017   04/08/2017 PHQ9 = 17, GAD7 = 18 05/29/2017 PHQ 9 = 2, GAD 7 = 4  . Mixed irritable bowel syndrome 05/29/2017  . Raynaud's disease without gangrene 08/04/2017  . RLS (restless legs syndrome) 08/04/2017   Past Surgical History:  Procedure Laterality Date  . TUBAL LIGATION      Social History   Tobacco Use  . Smoking status: Current Every Day Smoker    Packs/day: 0.25    Types: E-cigarettes  . Smokeless tobacco: Never Used  Substance Use Topics  . Alcohol use: Yes    Comment: Socially   family history includes Diabetes in her maternal grandmother and paternal grandmother.  Medications: Current Outpatient Medications  Medication Sig Dispense Refill  . [START ON 02/02/2018] amphetamine-dextroamphetamine (ADDERALL XR) 20 MG 24 hr capsule Take 1 capsule (20 mg total) by mouth every morning. 30 capsule 0  . [START ON 01/02/2018] amphetamine-dextroamphetamine (ADDERALL XR) 20 MG 24 hr capsule Take 1 capsule (20 mg total) by mouth every morning. 30 capsule 0  . amphetamine-dextroamphetamine (ADDERALL XR) 20 MG 24 hr capsule Take 1 capsule (20 mg total) by mouth every morning. 30 capsule 0  . cyclobenzaprine (FLEXERIL) 10 MG tablet One half tab PO qHS, then increase gradually to one tab TID. 30 tablet 0  . meloxicam (MOBIC) 15 MG tablet One tab PO qAM with breakfast for 2 weeks, then daily prn pain. 30 tablet 3  . diclofenac sodium (VOLTAREN) 1 % GEL Apply 2 g topically 4 (four) times daily. To affected joint. 100 g 11   No current facility-administered medications for this visit.    Allergies  Allergen Reactions  . Cymbalta [Duloxetine Hcl]     Made exhaustion worse.   Dustin Flock [Vortioxetine]     Increasing IBS symptoms.   . Wellbutrin [Bupropion] Other (See Comments)    Worsening of symptoms      Discussed warning signs or symptoms. Please see discharge instructions. Patient expresses understanding.

## 2018-01-06 MED FILL — ADDERALL XR 20 MG CAP SA: 20 | 30 days supply | Qty: 30 | Fill #0

## 2018-02-06 MED FILL — ADDERALL XR 20 MG CAP SA: 20 | 30 days supply | Qty: 30 | Fill #0

## 2018-02-25 ENCOUNTER — Encounter: Payer: Self-pay | Admitting: Physician Assistant

## 2018-02-25 ENCOUNTER — Ambulatory Visit (INDEPENDENT_AMBULATORY_CARE_PROVIDER_SITE_OTHER): Payer: 59 | Admitting: Physician Assistant

## 2018-02-25 VITALS — BP 105/71 | HR 79 | Ht 64.25 in | Wt 131.0 lb

## 2018-02-25 DIAGNOSIS — R5382 Chronic fatigue, unspecified: Secondary | ICD-10-CM

## 2018-02-25 DIAGNOSIS — G9332 Myalgic encephalomyelitis/chronic fatigue syndrome: Secondary | ICD-10-CM

## 2018-02-25 MED ORDER — AMPHETAMINE-DEXTROAMPHET ER 30 MG PO CP24
30.0000 mg | ORAL_CAPSULE | ORAL | 0 refills | Status: DC
Start: 2018-03-27 — End: 2018-03-13

## 2018-02-25 MED ORDER — AMPHETAMINE-DEXTROAMPHET ER 30 MG PO CP24: 30.0000 mg | ORAL_CAPSULE | ORAL | 0 refills | Status: DC

## 2018-02-25 MED ORDER — AMPHETAMINE-DEXTROAMPHET ER 30 MG PO CP24
30.0000 mg | ORAL_CAPSULE | ORAL | 0 refills | Status: DC
Start: 2018-04-27 — End: 2018-03-13

## 2018-02-25 MED FILL — ADDERALL XR 30 MG CAP SA: 30 | 30 days supply | Qty: 30 | Fill #0

## 2018-02-25 NOTE — Progress Notes (Signed)
   Subjective:    Patient ID: Teresa Garcia, female    DOB: 1980-08-24, 37 y.o.   MRN: [id number]  HPI Patient is a 37 year old female who presents to the clinic to follow-up on chronic fatigue.  About 4 months ago we started her on Adderall.  She initially had a wonderful response.  It has slowly diminished over the past 4 months.  She is now to the point where about 1:00 in afternoon she feels very lethargic and fatigued.  She immediately goes home and takes a nap and then still sleeps from 10 until the next morning.  On the weekends she can sleep from about 9 or 10 at night until about 1030 or 11 in the morning with a nap in afternoon. She has had an extensive work up for fatigue with labs. She has order for sleep study. She has not had time to get this yet. She has tried numerous antidepressants. She does notice that stress causes her more fatigue.   .. Active Ambulatory Problems    Diagnosis Date Noted  . Myalgia and myositis 02/20/2016  . Decreased iron stores 02/21/2016  . Cervical paraspinal muscle spasm 03/29/2016  . Abnormal weight gain 06/06/2016  . Tonsil stone 08/05/2016  . Ecchymosis 01/20/2017  . Depression, recurrent (HCC) 03/26/2017  . Cubital tunnel syndrome on right 05/02/2017  . Mixed irritable bowel syndrome 05/29/2017  . Dysphagia 08/04/2017  . Chronic bilateral low back pain without sciatica 08/04/2017  . RLS (restless legs syndrome) 08/04/2017  . Paresthesia 08/04/2017  . Raynaud's disease without gangrene 08/04/2017  . Chronic fatigue syndrome 11/06/2017  . Non-restorative sleep 11/07/2017  . Hypersomnolence 11/07/2017  . Carpal tunnel syndrome 12/31/2017  . De Quervain's tenosynovitis, right 12/31/2017   Resolved Ambulatory Problems    Diagnosis Date Noted  . Bilateral leg pain 02/20/2016  . No energy 02/20/2016  . Right knee pain 02/20/2016  . Inattention 07/15/2016  . Anxiety 09/25/2016  . Forgetfulness 09/25/2016  . Mood changes 03/27/2017  .  Fullness of neck 08/04/2017  . Numbness and tingling of both lower extremities 08/15/2017   No Additional Past Medical History      Review of Systems    see HPI.  Objective:   Physical Exam  Constitutional: She is oriented to person, place, and time. She appears well-developed and well-nourished.  HENT:  Head: Normocephalic and atraumatic.  Cardiovascular: Normal rate and regular rhythm.  Pulmonary/Chest: Effort normal and breath sounds normal.  Neurological: She is alert and oriented to person, place, and time.  Psychiatric: She has a normal mood and affect. Her behavior is normal.          Assessment & Plan:  Marland KitchenMarland KitchenDiagnoses and all orders for this visit:  Chronic fatigue syndrome -     amphetamine-dextroamphetamine (ADDERALL XR) 30 MG 24 hr capsule; Take 1 capsule (30 mg total) by mouth every morning. -     amphetamine-dextroamphetamine (ADDERALL XR) 30 MG 24 hr capsule; Take 1 capsule (30 mg total) by mouth every morning. -     amphetamine-dextroamphetamine (ADDERALL XR) 30 MG 24 hr capsule; Take 1 capsule (30 mg total) by mouth every morning. -     Cortisol, free, Serum   Increased to 30mg  XR. Concerned about adrenal fatigue. Given HO for diet. Will check morning cortisol. Looked back and that has not been done. Encouraged to get sleep study.

## 2018-03-02 ENCOUNTER — Encounter: Payer: Self-pay | Admitting: Physician Assistant

## 2018-03-02 ENCOUNTER — Ambulatory Visit (INDEPENDENT_AMBULATORY_CARE_PROVIDER_SITE_OTHER): Payer: 59 | Admitting: Physician Assistant

## 2018-03-02 VITALS — BP 106/78 | HR 75 | Ht 64.25 in | Wt 132.0 lb

## 2018-03-02 DIAGNOSIS — R0981 Nasal congestion: Secondary | ICD-10-CM | POA: Diagnosis not present

## 2018-03-02 DIAGNOSIS — R6889 Other general symptoms and signs: Secondary | ICD-10-CM

## 2018-03-02 DIAGNOSIS — H109 Unspecified conjunctivitis: Secondary | ICD-10-CM

## 2018-03-02 MED ORDER — FLUTICASONE PROPIONATE 50 MCG/ACT NA SUSP: 2.0000 | Freq: Every day | NASAL | 1 refills | Status: DC

## 2018-03-02 MED ORDER — POLYMYXIN B-TRIMETHOPRIM 10000-0.1 UNIT/ML-% OP SOLN: 1.0000 [drp] | OPHTHALMIC | 0 refills | Status: DC

## 2018-03-02 MED ORDER — AZELASTINE HCL 0.05 % OP SOLN: 1.0000 [drp] | Freq: Two times a day (BID) | OPHTHALMIC | 1 refills | Status: DC

## 2018-03-02 MED FILL — FLUTICASONE PROP 50 MCG SPR: 50 | 30 days supply | Qty: 16 | Fill #0

## 2018-03-02 MED FILL — POLYMYXIN B/TMP EYE DROPS: 10000-0.1 | 30 days supply | Qty: 10 | Fill #0

## 2018-03-02 MED FILL — AZELASTINE HCL 0.05% DROPS: 0.05 | 30 days supply | Qty: 6 | Fill #0

## 2018-03-02 NOTE — Progress Notes (Signed)
Subjective:     Patient ID: Teresa Garcia, female   DOB: 10-07-1980, 37 y.o.   MRN: [id number]  HPI Patient is a 37 yo female presenting today with a complaint of right eye irritation. Patient states that her eye has felt itchy for about a month, however two days ago she woke up from a nap and noticed that the inside corner of her eye was glued shut and she had to pull it apart. She described the substance as "gooey." She reports that the following morning her eye was again glued shut. Patient reports that last week she did have some sinus congestion and states that she still feels as though she still has some sinus pressure, but denies sinus pain. She does report that the eye was better today and not glued shut as it has been in the past two days. Patient denies any eye pain or pain on eye movements. She has not tried any eye drops or taken any medications. Patient denies and history of any trauma to the eye or any memory of a foreign body in the eye. Patient also denies any sick contacts  .Marland Kitchen. Active Ambulatory Problems    Diagnosis Date Noted  . Myalgia and myositis 02/20/2016  . Decreased iron stores 02/21/2016  . Cervical paraspinal muscle spasm 03/29/2016  . Abnormal weight gain 06/06/2016  . Tonsil stone 08/05/2016  . Ecchymosis 01/20/2017  . Depression, recurrent (HCC) 03/26/2017  . Cubital tunnel syndrome on right 05/02/2017  . Mixed irritable bowel syndrome 05/29/2017  . Dysphagia 08/04/2017  . Chronic bilateral low back pain without sciatica 08/04/2017  . RLS (restless legs syndrome) 08/04/2017  . Paresthesia 08/04/2017  . Raynaud's disease without gangrene 08/04/2017  . Chronic fatigue syndrome 11/06/2017  . Non-restorative sleep 11/07/2017  . Hypersomnolence 11/07/2017  . Carpal tunnel syndrome 12/31/2017  . De Quervain's tenosynovitis, right 12/31/2017   Resolved Ambulatory Problems    Diagnosis Date Noted  . Bilateral leg pain 02/20/2016  . No energy 02/20/2016  .  Right knee pain 02/20/2016  . Inattention 07/15/2016  . Anxiety 09/25/2016  . Forgetfulness 09/25/2016  . Mood changes 03/27/2017  . Fullness of neck 08/04/2017  . Numbness and tingling of both lower extremities 08/15/2017   No Additional Past Medical History     Review of Systems  Constitutional: Negative for chills and fever.  HENT: Positive for congestion and sinus pressure. Negative for sinus pain.   Eyes: Positive for discharge, redness and itching.       Objective:   Physical Exam  Constitutional: She appears well-developed and well-nourished.  HENT:  Head: Normocephalic and atraumatic.  Right Ear: External ear normal.  Left Ear: External ear normal.  Mouth/Throat: Oropharynx is clear and moist.  Nasal turbinates red and swollen.   Eyes: Pupils are equal, round, and reactive to light. EOM and lids are normal. Right conjunctiva is injected.  Cardiovascular: Normal rate.  Lymphadenopathy:    She has no cervical adenopathy.  Neurological: She is alert.       Assessment:     Marland Kitchen.Marland Kitchen.Diagnoses and all orders for this visit:  Bacterial conjunctivitis -     trimethoprim-polymyxin b (POLYTRIM) ophthalmic solution; Place 1 drop into the right eye every 4 (four) hours. For 7 days.  Nasal congestion -     fluticasone (FLONASE) 50 MCG/ACT nasal spray; Place 2 sprays into both nostrils daily.  Itchy eyes -     azelastine (OPTIVAR) 0.05 % ophthalmic solution; Place 1 drop into both eyes 2 (  two) times daily.       Plan:     Patient's itchy eyes have been ongoing which suggest some allergic conjunctivitis.  I will give Optivar to start for this.  I do think this has likely transition into some bacterial conjunctivitis with the green goopy discharge and eyes matted shut.  She will treat for 7 days with eyedrops. Encouraged warm compresses.  Discussed prevention of spread.  She was written out of work for the rest of today she may come back tomorrow.  Instructed to throw away  mascara.  When she is finished a 7-day treatment she may start using the Optivar.  She does complain of some nasal congestion overall encouraged her to use Flonase.  Follow-up as needed.    Marland KitchenHarlon Flor PA-C, have reviewed and agree with the above documentation in it's entirety.

## 2018-03-02 NOTE — Patient Instructions (Signed)

## 2018-03-03 DIAGNOSIS — R5382 Chronic fatigue, unspecified: Secondary | ICD-10-CM | POA: Diagnosis not present

## 2018-03-07 LAB — CORTISOL, FREE: Cortisol Free, Ser: 0.5 ug/dL

## 2018-03-09 NOTE — Progress Notes (Signed)
Call pt: confirm she did have drawn in am? Looks good normal level.

## 2018-03-13 ENCOUNTER — Ambulatory Visit (INDEPENDENT_AMBULATORY_CARE_PROVIDER_SITE_OTHER): Payer: 59 | Admitting: Certified Nurse Midwife

## 2018-03-13 ENCOUNTER — Encounter: Payer: Self-pay | Admitting: Certified Nurse Midwife

## 2018-03-13 VITALS — BP 124/89 | HR 105 | Ht 66.0 in | Wt 127.0 lb

## 2018-03-13 DIAGNOSIS — Z1151 Encounter for screening for human papillomavirus (HPV): Secondary | ICD-10-CM

## 2018-03-13 DIAGNOSIS — N898 Other specified noninflammatory disorders of vagina: Secondary | ICD-10-CM | POA: Diagnosis not present

## 2018-03-13 DIAGNOSIS — N920 Excessive and frequent menstruation with regular cycle: Secondary | ICD-10-CM | POA: Insufficient documentation

## 2018-03-13 DIAGNOSIS — Z113 Encounter for screening for infections with a predominantly sexual mode of transmission: Secondary | ICD-10-CM

## 2018-03-13 DIAGNOSIS — Z124 Encounter for screening for malignant neoplasm of cervix: Secondary | ICD-10-CM | POA: Diagnosis not present

## 2018-03-13 DIAGNOSIS — Z01419 Encounter for gynecological examination (general) (routine) without abnormal findings: Secondary | ICD-10-CM

## 2018-03-13 NOTE — Progress Notes (Signed)
Pt c/o constant vaginal discharge that has no odor and no itching.  Pt states her periods start out as brown discharge for 3 days and then she has one day of bleeding.   Last pap 05/02/16- negative with HPV detected

## 2018-03-13 NOTE — Progress Notes (Signed)
Gynecology Annual Exam   History of Present Illness: Patient is a 37 y.o. G3P3003 presents for annual exam. The patient complains of vaginal discharge for almost 1 year. Describes as yellow and thick, no odor, no itching. No new sexual partner.The patient is sexually active. She denies dyspareunia. She also reports brown spotting prior to menses and for many days after menses which lasts in total about 2 weeks (including menstrual flow). This impacts her frequency of IC. The patient does not perform self breast exams. There is no notable family history of breast or ovarian cancer in her family. The patient denies current symptoms of depression.    Past Medical History:  Past Medical History:  Diagnosis Date  . Chronic bilateral low back pain without sciatica 08/04/2017  . Cubital tunnel syndrome on right 05/02/2017  . Depression, recurrent (HCC) 03/26/2017   04/08/2017 PHQ9 = 17, GAD7 = 18 05/29/2017 PHQ 9 = 2, GAD 7 = 4  . Mixed irritable bowel syndrome 05/29/2017  . Raynaud's disease without gangrene 08/04/2017  . RLS (restless legs syndrome) 08/04/2017    Past Surgical History:  Past Surgical History:  Procedure Laterality Date  . TUBAL LIGATION      Gynecologic History:  LMP: Patient's last menstrual period was 02/19/2018. Average Interval: regular Heavy Menses: no Clots: no Intermenstrual Bleeding: no Postcoital Bleeding: no Dysmenorrhea: no Contraception: tubal ligation Last Pap: completed on 05/02/16 ; result was: NIL and HR HPV+  Mammogram: n/a  Obstetric History: G3P3003  Family History:  Family History  Problem Relation Age of Onset  . Diabetes Paternal Grandmother   . Diabetes Maternal Grandmother     Social History:  Social History   Socioeconomic History  . Marital status: Significant Other    Spouse name: Not on file  . Number of children: Not on file  . Years of education: Not on file  . Highest education level: Not on file  Occupational History  . Not  on file  Social Needs  . Financial resource strain: Not on file  . Food insecurity:    Worry: Not on file    Inability: Not on file  . Transportation needs:    Medical: Not on file    Non-medical: Not on file  Tobacco Use  . Smoking status: Current Every Day Smoker    Packs/day: 0.25    Types: E-cigarettes  . Smokeless tobacco: Never Used  Substance and Sexual Activity  . Alcohol use: Yes    Comment: Socially  . Drug use: No  . Sexual activity: Yes    Birth control/protection: Surgical  Lifestyle  . Physical activity:    Days per week: Not on file    Minutes per session: Not on file  . Stress: Not on file  Relationships  . Social connections:    Talks on phone: Not on file    Gets together: Not on file    Attends religious service: Not on file    Active member of club or organization: Not on file    Attends meetings of clubs or organizations: Not on file    Relationship status: Not on file  . Intimate partner violence:    Fear of current or ex partner: Not on file    Emotionally abused: Not on file    Physically abused: Not on file    Forced sexual activity: Not on file  Other Topics Concern  . Not on file  Social History Narrative  . Not on file  Allergies:  Allergies  Allergen Reactions  . Cymbalta [Duloxetine Hcl]     Made exhaustion worse.   Dustin Flock [Vortioxetine]     Increasing IBS symptoms.   . Wellbutrin [Bupropion] Other (See Comments)    Worsening of symptoms    Medications: Prior to Admission medications   Medication Sig Start Date End Date Taking? Authorizing Provider  amphetamine-dextroamphetamine (ADDERALL XR) 30 MG 24 hr capsule Take 1 capsule (30 mg total) by mouth every morning. 02/25/18  Yes Breeback, Jade L, PA-C  diclofenac sodium (VOLTAREN) 1 % GEL Apply 2 g topically 4 (four) times daily. To affected joint. 12/31/17  Yes Rodolph Bong, MD  meloxicam (MOBIC) 15 MG tablet One tab PO qAM with breakfast for 2 weeks, then daily prn  pain. 03/29/16  Yes Monica Becton, MD    Review of Systems: negative except noted in HPI  Physical Exam Vitals: Blood pressure 124/89, pulse (!) 105, height 5\' 6"  (1.676 m), weight 57.6 kg, last menstrual period 02/19/2018. General: NAD HEENT: normocephalic, atraumatic Thyroid: no enlargement, no palpable nodules Pulmonary: Normal rate and effort, CTAB Cardiovascular: RRR Breast: Breast symmetrical, no tenderness, no palpable nodules or masses, no skin or nipple retraction present, no nipple discharge. No axillary or supraclavicular lymphadenopathy. Abdomen: soft, non-tender, non-distended. No hepatomegaly, splenomegaly or masses palpable. No evidence of hernia  Genitourinary:  External: Normal external female genitalia. Normal urethral meatus  Vagina: Normal vaginal mucosa, no evidence of prolapse, scant thin white discharge  Cervix: Grossly normal in appearance, no bleeding  Uterus: Non-enlarged, mobile, normal contour. No CMT  Adnexa: non-enlarged, no masses  Rectal: deferred Extremities: no edema, erythema, or tenderness Neurologic: Grossly intact Psychiatric: mood appropriate, affect full  Female chaperone present for pelvic and breast portions of the physical exam  Assessment:  1. Well woman exam with routine gynecological exam   2. Vaginal discharge   3. Menorrhagia with regular cycle    Plan: Pap and cervical cultures Discussed mngt of bleeding pattern by using OCP, pt would like to try- she can take them continuous if desires; Loloestrin #3 pks given Follow up for BP check in 1 month Follow up with PCP as scheduled  Donette Larry, CNM 03/13/2018 11:59 AM

## 2018-03-17 LAB — CYTOLOGY - PAP
Bacterial vaginitis: NEGATIVE
CHLAMYDIA, DNA PROBE: NEGATIVE
Candida vaginitis: NEGATIVE
Diagnosis: NEGATIVE
HPV: DETECTED — AB
NEISSERIA GONORRHEA: NEGATIVE
Trichomonas: NEGATIVE

## 2018-03-20 ENCOUNTER — Ambulatory Visit (INDEPENDENT_AMBULATORY_CARE_PROVIDER_SITE_OTHER): Payer: 59 | Admitting: Physician Assistant

## 2018-03-20 DIAGNOSIS — R5382 Chronic fatigue, unspecified: Secondary | ICD-10-CM

## 2018-03-20 DIAGNOSIS — G9332 Myalgic encephalomyelitis/chronic fatigue syndrome: Secondary | ICD-10-CM

## 2018-03-20 LAB — POCT HEMOGLOBIN: HEMOGLOBIN: 12.6 g/dL (ref 12.2–16.2)

## 2018-03-20 NOTE — Progress Notes (Signed)
Pt presents to office for POCT Hgb. Pt has complaints of extreme fatigue for 2 days.   .. Results for orders placed or performed in visit on 03/20/18  POCT hemoglobin  Result Value Ref Range   Hemoglobin 12.6 12.2 - 16.2 g/dL   Stable hgb. Discussed endocrinology referral for her CFS. She will think about it. Tandy Gaw PA-C

## 2018-03-30 ENCOUNTER — Telehealth: Payer: Self-pay | Admitting: *Deleted

## 2018-03-30 NOTE — Telephone Encounter (Signed)
Pt notifed of pap results and she is to schedule a colposcopy with one of the MD's

## 2018-03-30 NOTE — Telephone Encounter (Signed)
-----   Message from Donette Larry, [place] sent at 03/17/2018  5:26 PM EDT ----- Needs colpo scheduled

## 2018-04-07 MED FILL — ADDERALL XR 30 MG CAP SA: 30 | 30 days supply | Qty: 30 | Fill #0

## 2018-04-13 ENCOUNTER — Ambulatory Visit: Payer: 59 | Admitting: *Deleted

## 2018-04-13 VITALS — BP 111/74 | HR 80

## 2018-04-13 DIAGNOSIS — R102 Pelvic and perineal pain: Secondary | ICD-10-CM

## 2018-04-13 DIAGNOSIS — M545 Low back pain, unspecified: Secondary | ICD-10-CM

## 2018-04-13 NOTE — Progress Notes (Signed)
Pt here for BP check only since going on OCP's.  BP today is 111/74

## 2018-04-15 ENCOUNTER — Encounter: Payer: Self-pay | Admitting: Physician Assistant

## 2018-04-15 ENCOUNTER — Ambulatory Visit (INDEPENDENT_AMBULATORY_CARE_PROVIDER_SITE_OTHER): Payer: 59 | Admitting: Physician Assistant

## 2018-04-15 VITALS — BP 109/69 | HR 79 | Ht 66.0 in | Wt 129.0 lb

## 2018-04-15 DIAGNOSIS — R5382 Chronic fatigue, unspecified: Secondary | ICD-10-CM | POA: Diagnosis not present

## 2018-04-15 DIAGNOSIS — F419 Anxiety disorder, unspecified: Secondary | ICD-10-CM

## 2018-04-15 DIAGNOSIS — G4710 Hypersomnia, unspecified: Secondary | ICD-10-CM

## 2018-04-15 DIAGNOSIS — G9332 Myalgic encephalomyelitis/chronic fatigue syndrome: Secondary | ICD-10-CM

## 2018-04-15 DIAGNOSIS — R87820 Cervical low risk human papillomavirus (HPV) DNA test positive: Secondary | ICD-10-CM | POA: Diagnosis not present

## 2018-04-15 DIAGNOSIS — R87619 Unspecified abnormal cytological findings in specimens from cervix uteri: Secondary | ICD-10-CM | POA: Insufficient documentation

## 2018-04-15 DIAGNOSIS — Z0000 Encounter for general adult medical examination without abnormal findings: Secondary | ICD-10-CM

## 2018-04-15 MED ORDER — AMPHETAMINE-DEXTROAMPHET ER 30 MG PO CP24: 30.0000 mg | ORAL_CAPSULE | ORAL | 0 refills | Status: DC

## 2018-04-15 MED ORDER — HYDROXYZINE HCL 10 MG PO TABS: 10.0000 mg | ORAL_TABLET | Freq: Three times a day (TID) | ORAL | 0 refills | Status: DC | PRN

## 2018-04-15 NOTE — Patient Instructions (Signed)

## 2018-04-15 NOTE — Progress Notes (Signed)
Subjective:     Teresa Garcia is a 37 y.o. female and is here for a comprehensive physical exam. The patient reports no problems.  She does need refills of adderall for CFS. She is doing well. No problems or concerns.   She still having some anxiety at times. She wonders if she could have something as needed.                    Social History   Socioeconomic History  . Marital status: Significant Other    Spouse name: Not on file  . Number of children: Not on file  . Years of education: Not on file  . Highest education level: Not on file  Occupational History  . Not on file  Social Needs  . Financial resource strain: Not on file  . Food insecurity:    Worry: Not on file    Inability: Not on file  . Transportation needs:    Medical: Not on file    Non-medical: Not on file  Tobacco Use  . Smoking status: Current Every Day Smoker    Packs/day: 0.25    Types: E-cigarettes  . Smokeless tobacco: Never Used  Substance and Sexual Activity  . Alcohol use: Yes    Comment: Socially  . Drug use: No  . Sexual activity: Yes    Birth control/protection: Surgical  Lifestyle  . Physical activity:    Days per week: Not on file    Minutes per session: Not on file  . Stress: Not on file  Relationships  . Social connections:    Talks on phone: Not on file    Gets together: Not on file    Attends religious service: Not on file    Active member of club or organization: Not on file    Attends meetings of clubs or organizations: Not on file    Relationship status: Not on file  . Intimate partner violence:    Fear of current or ex partner: Not on file    Emotionally abused: Not on file    Physically abused: Not on file    Forced sexual activity: Not on file  Other Topics Concern  . Not on file  Social History Narrative  . Not on file   Health Maintenance  Topic Date Due  . TETANUS/TDAP  04/01/2019 (Originally 01/03/2001)  . PAP SMEAR  03/13/2021  . INFLUENZA VACCINE  Completed   . HIV Screening  Completed    The following portions of the patient's history were reviewed and updated as appropriate: allergies, current medications, past family history, past medical history, past social history, past surgical history and problem list.  Review of Systems A comprehensive review of systems was negative.   Objective:    BP 109/69   Pulse 79   Ht 5\' 6"  (1.676 m)   Wt 129 lb (58.5 kg)   BMI 20.82 kg/m  General appearance: alert, cooperative and appears stated age Head: Normocephalic, without obvious abnormality, atraumatic Eyes: conjunctivae/corneas clear. PERRL, EOM's intact. Fundi benign. Ears: normal TM's and external ear canals both ears Nose: Nares normal. Septum midline. Mucosa normal. No drainage or sinus tenderness. Throat: lips, mucosa, and tongue normal; teeth and gums normal Neck: no adenopathy, no carotid bruit, no JVD, supple, symmetrical, trachea midline and thyroid not enlarged, symmetric, no tenderness/mass/nodules Back: symmetric, no curvature. ROM normal. No CVA tenderness. Lungs: clear to auscultation bilaterally Heart: regular rate and rhythm, S1, S2 normal, no murmur, click, rub or  gallop Abdomen: soft, non-tender; bowel sounds normal; no masses,  no organomegaly Extremities: extremities normal, atraumatic, no cyanosis or edema Pulses: 2+ and symmetric Skin: Skin color, texture, turgor normal. No rashes or lesions Lymph nodes: Cervical, supraclavicular, and axillary nodes normal. Neurologic: Alert and oriented X 3, normal strength and tone. Normal symmetric reflexes. Normal coordination and gait    Assessment:    Healthy female exam.      Plan:    Marland KitchenMarland KitchenNicolena was seen today for annual exam.  Diagnoses and all orders for this visit:  Well woman exam without gynecological exam  Papanicolaou smear of cervix with low risk human papillomavirus (HPV) DNA test positive  Chronic fatigue syndrome -     amphetamine-dextroamphetamine (ADDERALL  XR) 30 MG 24 hr capsule; Take 1 capsule (30 mg total) by mouth every morning. -     amphetamine-dextroamphetamine (ADDERALL XR) 30 MG 24 hr capsule; Take 1 capsule (30 mg total) by mouth every morning. -     amphetamine-dextroamphetamine (ADDERALL XR) 30 MG 24 hr capsule; Take 1 capsule (30 mg total) by mouth every morning.  Hypersomnolence -     amphetamine-dextroamphetamine (ADDERALL XR) 30 MG 24 hr capsule; Take 1 capsule (30 mg total) by mouth every morning. -     amphetamine-dextroamphetamine (ADDERALL XR) 30 MG 24 hr capsule; Take 1 capsule (30 mg total) by mouth every morning. -     amphetamine-dextroamphetamine (ADDERALL XR) 30 MG 24 hr capsule; Take 1 capsule (30 mg total) by mouth every morning.  Anxiety -     hydrOXYzine (ATARAX/VISTARIL) 10 MG tablet; Take 1 tablet (10 mg total) by mouth 3 (three) times daily as needed.   .. Depression screen [facility] 2/9 03/26/2017 09/25/2016  Decreased Interest 2 0  Down, Depressed, Hopeless 2 0  PHQ - 2 Score 4 0  Altered sleeping 1 0  Tired, decreased energy 3 3  Change in appetite 1 0  Feeling bad or failure about yourself  1 0  Trouble concentrating 3 1  Moving slowly or fidgety/restless 1 0  Suicidal thoughts 0 0  PHQ-9 Score 14 4   ... Discussed 150 minutes of exercise a week.  Encouraged vitamin D 1000 units and Calcium 1300mg  or 4 servings of dairy a day.  Pap up to date. She needs coloposcopy. She is being managed by GYN.   Labs up to date.  Vaccines up to date.   Vistaril as needed for anxiety. Discussed other cognitive ways to handle stress. Follow up as needed.   Refilled adderall for 3 months.   See After Visit Summary for Counseling Recommendations

## 2018-04-21 ENCOUNTER — Ambulatory Visit (INDEPENDENT_AMBULATORY_CARE_PROVIDER_SITE_OTHER): Payer: 59 | Admitting: Sports Medicine

## 2018-04-21 DIAGNOSIS — M76821 Posterior tibial tendinitis, right leg: Secondary | ICD-10-CM | POA: Diagnosis not present

## 2018-04-21 NOTE — Assessment & Plan Note (Signed)
Scaphoid pads placed in both shoes. Tibialis posterior rehab exercises given. Ibuprofen 800 mg 3 times daily for 2 weeks. Return for custom molded orthotics.

## 2018-04-21 NOTE — Progress Notes (Signed)
Subjective:    I'm seeing this patient as a consultation for: Tandy Gaw, PA-C  CC: Right foot pain  HPI: This is a pleasant 37 year old female, for the past few weeks she is had worsening pain on the plantar arch with radiation up the medial ankle just behind the medial malleolus, worse with prolonged weightbearing.  Moderate, persistent without radiation past the knee, no mechanical symptoms, no trauma.  I reviewed the past medical history, family history, social history, surgical history, and allergies today and no changes were needed.  Please see the problem list section below in epic for further details.  Past Medical History: Past Medical History:  Diagnosis Date  . Chronic bilateral low back pain without sciatica 08/04/2017  . Cubital tunnel syndrome on right 05/02/2017  . Depression, recurrent (HCC) 03/26/2017   04/08/2017 PHQ9 = 17, GAD7 = 18 05/29/2017 PHQ 9 = 2, GAD 7 = 4  . Mixed irritable bowel syndrome 05/29/2017  . Raynaud's disease without gangrene 08/04/2017  . RLS (restless legs syndrome) 08/04/2017   Past Surgical History: Past Surgical History:  Procedure Laterality Date  . TUBAL LIGATION     Social History: Social History   Socioeconomic History  . Marital status: Significant Other    Spouse name: Not on file  . Number of children: Not on file  . Years of education: Not on file  . Highest education level: Not on file  Occupational History  . Not on file  Social Needs  . Financial resource strain: Not on file  . Food insecurity:    Worry: Not on file    Inability: Not on file  . Transportation needs:    Medical: Not on file    Non-medical: Not on file  Tobacco Use  . Smoking status: Current Every Day Smoker    Packs/day: 0.25    Types: E-cigarettes  . Smokeless tobacco: Never Used  Substance and Sexual Activity  . Alcohol use: Yes    Comment: Socially  . Drug use: No  . Sexual activity: Yes    Birth control/protection: Surgical    Lifestyle  . Physical activity:    Days per week: Not on file    Minutes per session: Not on file  . Stress: Not on file  Relationships  . Social connections:    Talks on phone: Not on file    Gets together: Not on file    Attends religious service: Not on file    Active member of club or organization: Not on file    Attends meetings of clubs or organizations: Not on file    Relationship status: Not on file  Other Topics Concern  . Not on file  Social History Narrative  . Not on file   Family History: Family History  Problem Relation Age of Onset  . Diabetes Paternal Grandmother   . Diabetes Maternal Grandmother    Allergies: Allergies  Allergen Reactions  . Cymbalta [Duloxetine Hcl]     Made exhaustion worse.   Dustin Flock [Vortioxetine]     Increasing IBS symptoms.   . Wellbutrin [Bupropion] Other (See Comments)    Worsening of symptoms   Medications: See med rec.  Review of Systems: No headache, visual changes, nausea, vomiting, diarrhea, constipation, dizziness, abdominal pain, skin rash, fevers, chills, night sweats, weight loss, swollen lymph nodes, body aches, joint swelling, muscle aches, chest pain, shortness of breath, mood changes, visual or auditory hallucinations.   Objective:   General: Well Developed, well nourished, and  in no acute distress.  Neuro:  Extra-ocular muscles intact, able to move all 4 extremities, sensation grossly intact.  Deep tendon reflexes tested were normal. Psych: Alert and oriented, mood congruent with affect. ENT:  Ears and nose appear unremarkable.  Hearing grossly normal. Neck: Unremarkable overall appearance, trachea midline.  No visible thyroid enlargement. Eyes: Conjunctivae and lids appear unremarkable.  Pupils equal and round. Skin: Warm and dry, no rashes noted.  Cardiovascular: Pulses palpable, no extremity edema. Right ankle: Slight visible fullness behind the medial malleolus Range of motion is full in all  directions. Strength is 5/5 in all directions. Stable lateral and medial ligaments; squeeze test and kleiger test unremarkable; Talar dome nontender; No pain at base of 5th MT; No tenderness over cuboid; No tenderness over N spot or navicular prominence Tender to palpation behind the medial malleolus with reproduction of pain with resisted inversion of the ankle. No sign of peroneal tendon subluxations; Negative tarsal tunnel tinel's Able to walk 4 steps.  Scaphoid pads placed in both shoes.  Impression and Recommendations:   This case required medical decision making of moderate complexity.  Tibialis posterior tendinitis, right Scaphoid pads placed in both shoes. Tibialis posterior rehab exercises given. Ibuprofen 800 mg 3 times daily for 2 weeks. Return for custom molded orthotics. ___________________________________________ Ihor Austin. Benjamin Stain, M.D., ABFM., CAQSM. Primary Care and Sports Medicine [facility]  Adjunct Professor of Family Medicine  [employer]

## 2018-04-24 ENCOUNTER — Ambulatory Visit (INDEPENDENT_AMBULATORY_CARE_PROVIDER_SITE_OTHER): Payer: 59 | Admitting: Sports Medicine

## 2018-04-24 DIAGNOSIS — M797 Fibromyalgia: Secondary | ICD-10-CM | POA: Insufficient documentation

## 2018-04-24 DIAGNOSIS — M76821 Posterior tibial tendinitis, right leg: Secondary | ICD-10-CM

## 2018-04-24 NOTE — Assessment & Plan Note (Addendum)
A lot of this is stress related, although I do think she would benefit from [facility]. She is going to look into this and let me know.  Teresa Garcia has decided to try Savella, calling in starter pack, if too expensive we will use Cymbalta instead.

## 2018-04-24 NOTE — Assessment & Plan Note (Signed)
Continue Motrin 3 times daily, rehab exercises, custom orthotics created today.

## 2018-04-24 NOTE — Progress Notes (Signed)
    Patient was fitted for a : standard, cushioned, semi-rigid orthotic. The orthotic was heated and afterward the patient stood on the orthotic blank positioned on the orthotic stand. The patient was positioned in subtalar neutral position and 10 degrees of ankle dorsiflexion in a weight bearing stance. After completion of molding, a stable base was applied to the orthotic blank. The blank was ground to a stable position for weight bearing. Size: 6 Base: White EVA Additional Posting and Padding: None The patient ambulated these, and they were very comfortable.  I spent 40 minutes with this patient, greater than 50% was face-to-face time counseling regarding the below diagnosis.  ___________________________________________ Thomas J. Thekkekandam, M.D., ABFM., CAQSM. Primary Care and Sports Medicine [facility]  Adjunct Instructor of Family Medicine  [employer]   

## 2018-04-25 MED ORDER — MILNACIPRAN HCL 12.5 & 25 & 50 MG PO MISC: ORAL | 0 refills | Status: DC

## 2018-04-25 NOTE — Addendum Note (Signed)
Addended by: Monica Becton on: 04/25/2018 10:33 AM   Modules accepted: Orders

## 2018-04-27 ENCOUNTER — Encounter: Payer: 59 | Admitting: Obstetrics & Gynecology

## 2018-05-07 ENCOUNTER — Other Ambulatory Visit: Payer: Self-pay | Admitting: Physician Assistant

## 2018-05-07 DIAGNOSIS — F419 Anxiety disorder, unspecified: Secondary | ICD-10-CM

## 2018-05-28 ENCOUNTER — Ambulatory Visit: Payer: 59 | Admitting: Family Medicine

## 2018-05-29 ENCOUNTER — Encounter: Payer: Self-pay | Admitting: Family Medicine

## 2018-05-29 ENCOUNTER — Ambulatory Visit (INDEPENDENT_AMBULATORY_CARE_PROVIDER_SITE_OTHER): Payer: 59 | Admitting: Family Medicine

## 2018-05-29 VITALS — BP 124/85 | HR 111 | Ht 64.0 in | Wt 128.0 lb

## 2018-05-29 DIAGNOSIS — G9332 Myalgic encephalomyelitis/chronic fatigue syndrome: Secondary | ICD-10-CM

## 2018-05-29 DIAGNOSIS — I951 Orthostatic hypotension: Secondary | ICD-10-CM | POA: Diagnosis not present

## 2018-05-29 DIAGNOSIS — R5382 Chronic fatigue, unspecified: Secondary | ICD-10-CM

## 2018-05-29 DIAGNOSIS — R000 Tachycardia, unspecified: Secondary | ICD-10-CM

## 2018-05-29 DIAGNOSIS — G4710 Hypersomnia, unspecified: Secondary | ICD-10-CM | POA: Diagnosis not present

## 2018-05-29 MED ORDER — AMPHETAMINE-DEXTROAMPHET ER 30 MG PO CP24: 30.0000 mg | ORAL_CAPSULE | ORAL | 0 refills | Status: DC

## 2018-05-29 NOTE — Progress Notes (Signed)
Teresa Garcia is a 37 y.o. female who presents to [facility] Primary Care [facility] today for hypotension.  Teresa Garcia has a history of intermittent occasional hypotension and lightheadedness and dizziness.  She measured her blood pressure at home yesterday and it was 90 systolic.  Her heart rate was elevated as well.  She has had some work-up for this in the past including a normal free cortisol serum level in September as well as a normal metabolic work-up in March 2019. She notes that she has frequent episodes of significant lightheadedness and dizziness associated with stations.  She notes that she has had multiple episodes of near syncope especially occurring in the shower.  She notes she had a bad episode just recently where she almost passed out. Additionally she notes episodes of exertional dyspnea palpitations and tachycardia.  For example she has difficulty completing home activities such as vacuuming without becoming fatigued short of breath and developing palpitations.  She denies any chest pressure or pain.  Additionally she has episodes of significant fatigue as noted previously.  She is had evaluations as noted above and below.  She is thought to have chronic fatigue syndrome.  She will describe episodes of becoming rapidly tired and almost losing consciousness because she gets so sleepy.  She has had some laboratory work-up but has not had a sleep study yet.  She has not had a sleep latency study.   ROS as above:  Exam:  BP 124/85   Pulse (!) 111   Ht 5\' 4"  (1.626 m)   Wt 128 lb (58.1 kg)   BMI 21.97 kg/m  Wt Readings from Last 5 Encounters:  05/29/18 128 lb (58.1 kg)  04/15/18 129 lb (58.5 kg)  03/13/18 127 lb (57.6 kg)  03/02/18 132 lb (59.9 kg)  02/25/18 131 lb (59.4 kg)   Orthostatic VS for the past 24 hrs:  BP- Lying Pulse- Lying BP- Sitting Pulse- Sitting BP- Standing  at 0 minutes Pulse- Standing at 0 minutes  05/29/18 0834 109/77 98 116/76 108 103/73 120       Gen: Well NAD HEENT: EOMI,  MMM Lungs: Normal work of breathing. CTABL Heart: Tachycardia regular rhythm no MRG.  Sitting for 5 minutes heart rate decreased to 90 bpm. Abd: NABS, Soft. Nondistended, Nontender Exts: Brisk capillary refill, warm and well perfused.   Lab and Radiology Results  Twelve-lead EKG: Sinus rhythm at 90 bpm.  No ST elevation or depression.  No inverted T waves.  Large P wave present however No prior studies to compare to.     Chemistry      Component Value Date/Time   NA 139 08/01/2017 0904   K 4.7 08/01/2017 0904   CL 106 08/01/2017 0904   CO2 26 08/01/2017 0904   BUN 16 08/01/2017 0904   CREATININE 0.91 08/01/2017 0904      Component Value Date/Time   CALCIUM 9.5 08/01/2017 0904   ALKPHOS 38 02/20/2016 1159   AST 15 08/01/2017 0904   ALT 13 08/01/2017 0904   BILITOT 0.5 08/01/2017 0904     Lab Results  Component Value Date   FERRITIN 12 03/14/2017   Lab Results  Component Value Date   WBC 4.5 08/01/2017   HGB 12.6 03/20/2018   HCT 39.6 08/01/2017   MCV 90.4 08/01/2017   PLT 277 08/01/2017   Lab Results  Component Value Date   TSH 1.56 08/01/2017     Assessment and Plan: 37  y.o. female with  Orthostasis with palpitations.  This is an ongoing issue that waxes and wanes.  Additionally she has exertional fatigue.  I do believe she benefit from a cardiology evaluation.  She is already had a reasonable metabolic evaluation within the last year.  Plan to refer to cardiology.  Additionally will obtain a echocardiogram today to evaluate the structure of her heart and to further evaluate possible atrial enlargement seen on EKG.  Additionally she has significant daytime somnolence.  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  Prior to doing a very obnoxious sleep  latency study I think it is reasonable for her have a consultation with a sleep medicine provider.  Discussed her options and will refer to neurology.  In the meantime can obtain a sleep latency study if desired.  Additionally [place] in [place] and I have changed the location that her last 2 prescriptions of Adderall will go to.  [facility] [place].   Orders Placed This Encounter  Procedures  . Ambulatory referral to Cardiology    Referral Priority:   Routine    Referral Type:   Consultation    Referral Reason:   Specialty Services Required    Requested Specialty:   Cardiology    Number of Visits Requested:   1  . Ambulatory referral to Neurology    Referral Priority:   Routine    Referral Type:   Consultation    Referral Reason:   Specialty Services Required    Requested Specialty:   Neurology    Number of Visits Requested:   1  . EKG 12-Lead  . ECHOCARDIOGRAM COMPLETE    Standing Status:   Future    Standing Expiration Date:   08/28/2019    Order Specific Question:   Where should this test be performed    Answer[facility]   [facility]    Order Specific Question:   Does the patient weigh less than or greater than 250 lbs?    Answer:   Patient weighs less than 250 lbs    Order Specific Question:   Perflutren DEFINITY (image enhancing agent) should be administered unless hypersensitivity or allergy exist    Answer:   Administer Perflutren    Order Specific Question:   Reason for exam-Echo    Answer:   Abnormal ECG  794.31 / R94.31   Meds ordered this encounter  Medications  . amphetamine-dextroamphetamine (ADDERALL XR) 30 MG 24 hr capsule    Sig: Take 1 capsule (30 mg total) by mouth every morning. Fill in 30 days    Dispense:  30 capsule    Refill:  0  . amphetamine-dextroamphetamine (ADDERALL XR) 30 MG 24 hr capsule    Sig: Take 1 capsule (30 mg total) by mouth every morning.    Dispense:  30 capsule    Refill:   0     Historical information moved to improve visibility of documentation.  Past Medical History:  Diagnosis Date  . Chronic bilateral low back pain without sciatica 08/04/2017  . Cubital tunnel syndrome on right 05/02/2017  . Depression, recurrent (HCC) 03/26/2017   04/08/2017 PHQ9 = 17, GAD7 = 18 05/29/2017 PHQ 9 = 2, GAD 7 = 4  . Mixed irritable bowel syndrome 05/29/2017  . Raynaud's disease without gangrene 08/04/2017  . RLS (restless legs syndrome) 08/04/2017  Past Surgical History:  Procedure Laterality Date  . TUBAL LIGATION     Social History   Tobacco Use  . Smoking status: Current Every Day Smoker    Packs/day: 0.25    Types: E-cigarettes  . Smokeless tobacco: Never Used  Substance Use Topics  . Alcohol use: Yes    Comment: Socially   family history includes Diabetes in her maternal grandmother and paternal grandmother.  Medications: Current Outpatient Medications  Medication Sig Dispense Refill  . amphetamine-dextroamphetamine (ADDERALL XR) 30 MG 24 hr capsule Take 1 capsule (30 mg total) by mouth every morning. 30 capsule 0  . amphetamine-dextroamphetamine (ADDERALL XR) 30 MG 24 hr capsule Take 1 capsule (30 mg total) by mouth every morning. Fill in 30 days 30 capsule 0  . amphetamine-dextroamphetamine (ADDERALL XR) 30 MG 24 hr capsule Take 1 capsule (30 mg total) by mouth every morning. 30 capsule 0  . diclofenac sodium (VOLTAREN) 1 % GEL Apply 2 g topically 4 (four) times daily. To affected joint. 100 g 11  . hydrOXYzine (ATARAX/VISTARIL) 10 MG tablet TAKE 1 TABLET BY MOUTH THREE TIMES A DAY AS NEEDED 90 tablet 1  . meloxicam (MOBIC) 15 MG tablet One tab PO qAM with breakfast for 2 weeks, then daily prn pain. 30 tablet 3  . Milnacipran HCl 12.5 & 25 & 50 MG MISC Use as directed 1 each 0   No current facility-administered medications for this visit.    Allergies  Allergen Reactions  . Cymbalta [Duloxetine Hcl]     Made exhaustion worse.   Dustin Flock  [Vortioxetine]     Increasing IBS symptoms.   . Wellbutrin [Bupropion] Other (See Comments)    Worsening of symptoms     Discussed warning signs or symptoms. Please see discharge instructions. Patient expresses understanding.

## 2018-05-29 NOTE — Patient Instructions (Signed)
Thank you for coming in today.  You should hear about ECHO and cardiology referral.  You should also hear about neurology for possible narcolepsy.   Let me know who you are going to see for cardiology.   In the mean time get salty fluid.  Soups etc.    Postural Orthostatic Tachycardia Syndrome Postural orthostatic tachycardia syndrome (POTS) is a group of symptoms that can occur when you stand up after lying down. POTS happens when less blood flows to the heart than normal after you stand up. The reduced blood flow to the heart makes the heart beat rapidly. POTS may be associated with another medical condition, or it may occur on its own. What are the causes? This cause of this condition is not known, but many conditions and diseases have been linked to it. What increases the risk? This condition is more likely to develop in:  Women 4415-36 years old.  Women who are pregnant.  Women who are menstruating.  People who have certain conditions, including: ? A viral infection. ? An autoimmune disease. ? Anemia. ? Dehydration. ? Hyperthyroidism.  People who take certain medicines.  People who have had a major injury.  People who have had surgery.  What are the signs or symptoms? The most common symptom of this condition is lightheadedness after standing up from a lying down position. Other symptoms may include:  Feeling a rapid increase in the speed of the heartbeat (tachycardia) within 10 minutes of standing up.  Fainting.  Weakness.  Confusion.  Trembling.  Shortness of breath.  Sweating or flushing.  Headache.  Chest pain.  Breathing that is deeper and faster than normal (hyperventilation).  Nausea.  Anxiety.  Symptoms may be worse in the morning, and they may be relieved by lying down. How is this diagnosed? This condition is diagnosed based on:  Your symptoms.  Your medical history.  A physical exam.  Measurements of your heart rate when you are  lying down and after you stand up.  A measurement of your blood pressure. The measurement will be taken when you go from lying down to standing up.  Blood tests to measure hormones that change with blood pressure. The blood tests will be done when you are lying down and standing up.  You may have other tests to check whether you have a condition or disease that is linked to POTS. How is this treated? Treatment for this condition depends on how severe your symptoms are and whether you have any conditions or diseases that have been linked to POTS. Treatment may involve:  Treating any conditions or diseases that have been linked to POTS.  Drinking two glasses of water before getting up from a lying position.  Eating more salt (sodium).  Taking medicine to control blood pressure and heart rate (beta-blocker).  Taking medicines to control blood flow, blood pressure, or heart rate.  Avoiding certain medicines.  Starting an exercise program under the supervision of your health care provider.  Follow these instructions at home:  Eating and drinking  Drink enough fluid to keep your urine clear or pale yellow.  If told by your health care provider, drink two glasses of water before getting up from a lying position.  Follow instructions from your health care provider about how much sodium you should eat.  Eat several small meals a day instead of a few large meals.  Avoid heavy meals. Medicines  Take over-the-counter and prescription medicines only as told by your health care  provider.  Talk with your health care provider before starting any new medicines. Activity  Do an aerobic exercise for 20 minutes a day, at least 3 days a week.  Ask your health care provider what kinds of exercise are safe for you. Contact a health care provider if:  Your symptoms do not improve after treatment.  Your symptoms get worse.  You develop new symptoms. Get help right away if:  You have  chest pain.  You have difficulty breathing.  You have fainting episodes. This information is not intended to replace advice given to you by your health care provider. Make sure you discuss any questions you have with your health care provider. Document Released: 05/24/2002 Document Revised: 07/12/2015 Document Reviewed: 12/16/2014 Elsevier Interactive Patient Education  2018 [employer].    Narcolepsy Narcolepsy is a neurological disorder that causes you to fall asleep suddenly, and without control, during the daytime (sleep attacks). Narcolepsy is a lifelong (chronic) disorder. Normally, sleep follows a regular cycle over the course of the night. After about 90 minutes of light sleep, your sleep should become deeper. When your sleep becomes deeper, your body moves less and you start dreaming. This type of deep sleep is called rapid eye movement (REM) sleep. When you have narcolepsy, your REM sleep is not well-regulated. This disrupts your sleep cycle, which causes daytime sleepiness. What are the causes? The cause of narcolepsy is not fully understood, but it may be related to:  Low levels of hypocretin, a chemical (neurotransmitter) in the brain that controls sleep and wake cycles. Hypocretin imbalance may be caused by: ? Abnormal genes that are passed from parent to child (inherited). ? The body's defense system (immune system) attacking hypocretin brain cells (autoimmune disease).  Infection, tumor, or injury in the area of the brain that controls sleep.  Exposure to poisons (toxins), such as heavy metals, pesticides, and secondhand smoke.  What are the signs or symptoms? Symptoms of this condition include:  Excessive daytime sleepiness. This is the most common symptom and is usually the first symptom you will notice. This may affect your performance at work or school.  Sleep attacks. This means falling asleep suddenly and without control. You may fall asleep in the middle of an  activity, especially low-energy activities like reading or watching TV.  Feeling like you cannot think clearly.  Trouble focusing or remembering things.  Feeling depressed.  Sudden muscle weakness (cataplexy). When this occurs, your speech may become slurred, or your knees may buckle. Cataplexy is usually triggered by surprise, anger, fear, or laughter.  Loss of the ability to speak or move (sleep paralysis). This may occur just as you start to fall asleep or wake up. You will be aware of the paralysis. It usually lasts for just a few seconds or minutes.  Seeing, hearing, tasting, smelling, or feeling things that are not real (hallucinations). Hallucinations may occur with sleep paralysis. They can happen when you are falling asleep, waking up, or dozing.  Trouble staying asleep at night (insomnia).  Restless sleep.  How is this diagnosed? This condition may be diagnosed based on:  A physical exam to rule out any other problems that may be causing your symptoms.You may be asked to write down your sleeping patterns for several weeks in a sleep diary. This will help your health care provider make a diagnosis.  Sleep studies that measure how well your REM sleep is regulated. These tests also measure your heart rate, breathing, movement, and brain waves. These  tests include: ? An overnight sleep study (polysomnogram). ? A daytime sleep study that is done while you take several naps during the day (multiple sleep latency test, MSLT). This test measures how quickly you fall asleep and how quickly you enter REM sleep.  Removal of spinal fluid to measure hypocretin levels.  How is this treated? There is no cure for this condition, but treatment can help relieve symptoms. Treatment may include:  Lifestyle and sleeping strategies to help you cope with the condition, such as: ? Exercising regularly. ? Maintaining a regular sleep schedule. ? Avoiding caffeine and large meals before  bed.  Medicines. These may include: ? Medicines that help keep you awake and alert (stimulants) to fight daytime sleepiness. ? Medicines that treat depression (antidepressants). These may be used to treat cataplexy. ? Sodium oxybate. This is a strong medicine to help you relax (sedative) that you may take at night. It can help control daytime sleepiness and cataplexy.  Follow these instructions at home: Sleeping habits  Get about 8 hours of sleep every night.  Go to sleep and get up at about the same time every day.  Keep your bedroom dark, quiet, and comfortable.  When you feel very tired, take short naps. Schedule naps so that you take them at about the same time every day.  Tell your employer or teachers that you have narcolepsy. You may be able to adjust your schedule to include time for naps.  Before bedtime: ? Avoid bright lights and screens. ? Relax. Try activities like reading or taking a warm bath. Activity  Get at least 20 minutes of exercise every day. This will help you sleep better at night and reduce daytime sleepiness.  Avoid exercising within 3 hours of bedtime.  If you are sleepy, do not drive or use heavy machinery.  If possible, take a nap before driving.  Do not swim or go out on the water without a life jacket. Eating and drinking  Do not drink alcohol or caffeinated beverages within 4-5 hours of bedtime.  Do not eat a lot of food before bedtime. Eat meals at about the same times every day. General instructions  Take over-the-counter and prescription medicines only as told by your health care provider.  If directed, keep a sleep diary.  Tell your employer or teachers that you have narcolepsy. You may be able to adjust your schedule to include time for naps.  Do not use any products that contain nicotine or tobacco, such as cigarettes and e-cigarettes. If you need help quitting, ask your health care provider.  Keep all follow-up visits as told by  your health care provider. This is important. Contact a health care provider if:  Your symptoms are not getting better.  You have increasingly high blood pressure (hypertension).  You have changes in your heart rhythm.  You are having a hard time determining what is real and what is not (psychosis). Get help right away if:  You hurt yourself during a sleep attack or an attack of cataplexy.  You have chest pain.  You have trouble breathing. This information is not intended to replace advice given to you by your health care provider. Make sure you discuss any questions you have with your health care provider. Document Released: 05/24/2002 Document Revised: 05/27/2016 Document Reviewed: 05/27/2016 Elsevier Interactive Patient Education  [employer].

## 2018-06-01 ENCOUNTER — Ambulatory Visit (HOSPITAL_COMMUNITY): Payer: 59 | Attending: Cardiology

## 2018-06-01 ENCOUNTER — Other Ambulatory Visit: Payer: Self-pay

## 2018-06-01 DIAGNOSIS — R000 Tachycardia, unspecified: Secondary | ICD-10-CM | POA: Insufficient documentation

## 2018-06-01 DIAGNOSIS — I951 Orthostatic hypotension: Secondary | ICD-10-CM | POA: Diagnosis not present

## 2018-06-24 NOTE — Progress Notes (Signed)
Cardiology Office Note   Date:  06/29/2018   ID:  Teresa Garcia, DOB 05/22/1981, MRN [id number]  PCP:  Jomarie Longs, PA-C  Cardiologist:   Charlton Haws, MD   No chief complaint on file.     History of Present Illness: Teresa Garcia is a 38 y.o. female who presents for consultation regarding orthostasis and tachycardia. She has had this for months with previous w/u including normal free cortisol in September. She has lightheadedness, dizziness and pre syncope Chronic fatigue Shower makes symptoms worse Complains of palpitations and exertional dyspnea. Also with day time drowsiness Office notes from primary 05/29/18 show no systolics below 100 mmHg but HR went from 98 to 120 sitting to standing There is a question of narcolepsy. Being referred to neurology She has been taking Adderall.  She is a current less than ppd smoker  TTE reviewed 06/01/18 was normal EF 60-65% She has also had essentially normal MRI of head 08/11/17  Labs remarkable for normal cortisol, A1c ANA , RF, TSH and Hct  She works at [employer] in [place] Has 3 children 18,16 and 10 Interestingly she thinks her 48 yo daughter has same issue and not her boys  Past Medical History:  Diagnosis Date  . Chronic bilateral low back pain without sciatica 08/04/2017  . Cubital tunnel syndrome on right 05/02/2017  . Depression, recurrent (HCC) 03/26/2017   04/08/2017 PHQ9 = 17, GAD7 = 18 05/29/2017 PHQ 9 = 2, GAD 7 = 4  . Mixed irritable bowel syndrome 05/29/2017  . Raynaud's disease without gangrene 08/04/2017  . RLS (restless legs syndrome) 08/04/2017    Past Surgical History:  Procedure Laterality Date  . TUBAL LIGATION       Current Outpatient Medications  Medication Sig Dispense Refill  . amphetamine-dextroamphetamine (ADDERALL XR) 30 MG 24 hr capsule Take 1 capsule (30 mg total) by mouth every morning. 30 capsule 0  . hydrOXYzine (ATARAX/VISTARIL) 10 MG tablet TAKE 1 TABLET BY MOUTH THREE  TIMES A DAY AS NEEDED 90 tablet 1  . meloxicam (MOBIC) 15 MG tablet One tab PO qAM with breakfast for 2 weeks, then daily prn pain. 30 tablet 3  . Milnacipran HCl 12.5 & 25 & 50 MG MISC Use as directed 1 each 0  . nadolol (CORGARD) 20 MG tablet Take 1 tablet (20 mg total) by mouth daily. 30 tablet 11   No current facility-administered medications for this visit.     Allergies:   Cymbalta [duloxetine hcl]; Trintellix [vortioxetine]; and Wellbutrin [bupropion]    Social History:  The patient  reports that she has been smoking e-cigarettes. She has been smoking about 0.25 packs per day. She has never used smokeless tobacco. She reports current alcohol use. She reports that she does not use drugs.   Family History:  The patient's family history includes Diabetes in her maternal grandmother and paternal grandmother.    ROS:  Please see the history of present illness.   Otherwise, review of systems are positive for none.   All other systems are reviewed and negative.    PHYSICAL EXAM: VS:  BP 110/72   Pulse 83   Ht 5\' 4"  (1.626 m)   Wt 131 lb (59.4 kg)   BMI 22.49 kg/m  , BMI Body mass index is 22.49 kg/m. Affect appropriate Healthy:  appears stated age HEENT: normal Neck supple with no adenopathy JVP normal no bruits no thyromegaly Lungs clear with no wheezing and good diaphragmatic motion Heart:  S1/S2 no murmur, no rub, gallop or click PMI normal Abdomen: benighn, BS positve, no tenderness, no AAA no bruit.  No HSM or HJR Distal pulses intact with no bruits No edema Neuro non-focal Skin warm and dry No muscular weakness    EKG:  NSR normal read as bi atrial enlargement but likely due to thin body habitus 05/29/18   06/29/18 SR rate 83 normal   Recent Labs: 08/01/2017: ALT 13; BUN 16; Creat 0.91; Magnesium 2.2; Platelets 277; Potassium 4.7; Sodium 139; TSH 1.56 03/20/2018: Hemoglobin 12.6    Lipid Panel    Component Value Date/Time   CHOL 173 08/01/2017 0904   TRIG  32 08/01/2017 0904   HDL 69 08/01/2017 0904   CHOLHDL 2.5 08/01/2017 0904   LDLCALC 95 08/01/2017 0904      Wt Readings from Last 3 Encounters:  06/29/18 131 lb (59.4 kg)  05/29/18 128 lb (58.1 kg)  04/15/18 129 lb (58.5 kg)      Other studies Reviewed: Additional studies/ records that were reviewed today include: Notes from primary labs, ECG , TTE MRI of head .    ASSESSMENT AND PLAN:  1.  POTS:  Will get 48 hour holder and start on Nadolol 20 mg daily f/u with Dr Graciela HusbandsKlein  2. Anxiety: stable f/u with primary     Current medicines are reviewed at length with the patient today.  The patient does not have concerns regarding medicines.  The following changes have been made:  Nadolol 20 mg   Labs/ tests ordered today include: 48 hours holter   Orders Placed This Encounter  Procedures  . Ambulatory referral to Cardiac Electrophysiology  . HOLTER MONITOR - 48 HOUR  . EKG 12-Lead     Disposition:   FU with with EP      Signed, Charlton HawsPeter Kendyl Festa, MD  06/29/2018 9:17 AM    [facility] [address], [place], [place]  [address] Phone: [phone]; Fax: [phone]

## 2018-06-29 ENCOUNTER — Ambulatory Visit (INDEPENDENT_AMBULATORY_CARE_PROVIDER_SITE_OTHER): Payer: No Typology Code available for payment source | Admitting: Cardiovascular Disease

## 2018-06-29 VITALS — BP 110/72 | HR 83 | Ht 64.0 in | Wt 131.0 lb

## 2018-06-29 DIAGNOSIS — G90A Postural orthostatic tachycardia syndrome (POTS): Secondary | ICD-10-CM

## 2018-06-29 DIAGNOSIS — I498 Other specified cardiac arrhythmias: Secondary | ICD-10-CM

## 2018-06-29 DIAGNOSIS — R000 Tachycardia, unspecified: Secondary | ICD-10-CM

## 2018-06-29 DIAGNOSIS — I951 Orthostatic hypotension: Secondary | ICD-10-CM | POA: Diagnosis not present

## 2018-06-29 MED ORDER — NADOLOL 20 MG PO TABS: 20.0000 mg | ORAL_TABLET | Freq: Every day | ORAL | 11 refills | Status: DC

## 2018-06-29 MED FILL — NADOLOL 20 MG TAB: 20 | 30 days supply | Qty: 30 | Fill #0

## 2018-06-29 NOTE — Patient Instructions (Addendum)
Medication Instructions:  Your physician has recommended you make the following change in your medication:  1-START Nadolol 20 mg by mouth daily.  If you need a refill on your cardiac medications before your next appointment, please call your pharmacy.   Lab work:  If you have labs (blood work) drawn today and your tests are completely normal, you will receive your results only by: Marland Kitchen. MyChart Message (if you have MyChart) OR . A paper copy in the mail If you have any lab test that is abnormal or we need to change your treatment, we will call you to review the results.  Testing/Procedures: Your physician has recommended that you wear a 48 hour holter monitor. Holter monitors are medical devices that record the heart's electrical activity. Doctors most often use these monitors to diagnose arrhythmias. Arrhythmias are problems with the speed or rhythm of the heartbeat. The monitor is a small, portable device. You can wear one while you do your normal daily activities. This is usually used to diagnose what is causing palpitations/syncope (passing out).  Follow-Up: At [facility], you and your health needs are our priority.  As part of our continuing mission to provide you with exceptional heart care, we have created designated Provider Care Teams.  These Care Teams include your primary Cardiologist (physician) and Advanced Practice Providers (APPs -  Physician Assistants and Nurse Practitioners) who all work together to provide you with the care you need, when you need it. You have been referred to Dr. Graciela HusbandsKlein.     Nadolol tablets What is this medicine? NADOLOL (nay DOE lole) is a beta-blocker. Beta-blockers reduce the workload on the heart and help it to beat more regularly. This medicine is used to treat high blood pressure and to relieve chest pain caused by angina. This medicine may be used for other purposes; ask your health care provider or pharmacist if you have questions. COMMON BRAND  NAME(S): Corgard What should I tell my health care provider before I take this medicine? They need to know if you have any of these conditions: -diabetes -heart or vessel disease like slow heart rate, worsening heart failure, heart block, sick sinus syndrome or Raynaud's disease -kidney disease -lung or breathing disease, like asthma or emphysema -pheochromocytoma -thyroid disease -an unusual or allergic reaction to nadolol, other beta-blockers, medicines, foods, dyes, or preservatives -pregnant or trying to get pregnant -breast-feeding How should I use this medicine? Take this medicine by mouth with a glass of water. Follow the directions on the prescription label. You can take this medicine with or without food. Take your doses at regular intervals. Do not take your medicine more often than directed. Do not stop taking this medicine suddenly. This could lead to serious heart-related effects. Talk to your pediatrician regarding the use of this medicine in children. Special care may be needed. Overdosage: If you think you have taken too much of this medicine contact a poison control center or emergency room at once. NOTE: This medicine is only for you. Do not share this medicine with others. What if I miss a dose? If you miss a dose, take it as soon as you can. If it is almost time for your next dose, take only that dose. Do not take double or extra doses. What may interact with this medicine? This medicine may interact with the following medications: -certain medicines for blood pressure, heart disease, irregular heart beat -certain medicines for diabetes, like glipizide or glyburide -general anesthetics -medicines used to treat allergic  reactions like epinephrine This list may not describe all possible interactions. Give your health care provider a list of all the medicines, herbs, non-prescription drugs, or dietary supplements you use. Also tell them if you smoke, drink alcohol, or use  illegal drugs. Some items may interact with your medicine. What should I watch for while using this medicine? Visit your doctor or health care professional for regular check ups. Check your blood pressure and pulse rate regularly. Ask your health care professional what your blood pressure and pulse rate should be, and when you should contact them. You may get drowsy or dizzy. Do not drive, use machinery, or do anything that needs mental alertness until you know how this drug affects you. Do not stand or sit up quickly, especially if you are an older patient. This reduces the risk of dizzy or fainting spells. Alcohol can make you more drowsy and dizzy. Avoid alcoholic drinks. This medicine can affect blood sugar levels. If you have diabetes, check with your doctor or health care professional before you change your diet or the dose of your diabetic medicine. Do not treat yourself for coughs, colds, or pain while you are taking this medicine without asking your doctor or health care professional for advice. Some ingredients may increase your blood pressure. What side effects may I notice from receiving this medicine? Side effects that you should report to your doctor or health care professional as soon as possible: -allergic reactions like skin rash, itching or hives -cold, tingling, or numb hands or feet -difficulty breathing, wheezing -irregular heart beat -mental depression -slow heart rate -swelling of the legs or ankles Side effects that usually do not require medical attention (report to your doctor or health care professional if they continue or are bothersome): -change in sex drive or performance -dry itching skin -headache This list may not describe all possible side effects. Call your doctor for medical advice about side effects. You may report side effects to FDA at 1-800-FDA-1088. Where should I keep my medicine? Keep out of the reach of children. Store at room temperature between 15  and 30 degrees C (59 and 86 degrees F). Protect from light. Keep container tightly closed. Throw away any unused medicine after the expiration date. NOTE: This sheet is a summary. It may not cover all possible information. If you have questions about this medicine, talk to your doctor, pharmacist, or health care provider.  2019 Elsevier/Gold Standard (2013-02-05 14:45:05)

## 2018-07-06 MED FILL — ADDERALL XR 30 MG CAP SA: 30 | 30 days supply | Qty: 30 | Fill #0

## 2018-07-08 ENCOUNTER — Ambulatory Visit (INDEPENDENT_AMBULATORY_CARE_PROVIDER_SITE_OTHER): Payer: No Typology Code available for payment source

## 2018-07-08 DIAGNOSIS — R000 Tachycardia, unspecified: Secondary | ICD-10-CM

## 2018-07-28 ENCOUNTER — Ambulatory Visit: Payer: No Typology Code available for payment source | Admitting: Neurology

## 2018-07-28 ENCOUNTER — Encounter: Payer: Self-pay | Admitting: Neurology

## 2018-07-28 VITALS — BP 123/84 | HR 89 | Ht 64.0 in | Wt 131.0 lb

## 2018-07-28 DIAGNOSIS — G90A Postural orthostatic tachycardia syndrome (POTS): Secondary | ICD-10-CM

## 2018-07-28 DIAGNOSIS — G47411 Narcolepsy with cataplexy: Secondary | ICD-10-CM

## 2018-07-28 DIAGNOSIS — G478 Other sleep disorders: Secondary | ICD-10-CM | POA: Diagnosis not present

## 2018-07-28 DIAGNOSIS — R000 Tachycardia, unspecified: Secondary | ICD-10-CM

## 2018-07-28 DIAGNOSIS — R6889 Other general symptoms and signs: Secondary | ICD-10-CM

## 2018-07-28 DIAGNOSIS — G4710 Hypersomnia, unspecified: Secondary | ICD-10-CM

## 2018-07-28 DIAGNOSIS — I498 Other specified cardiac arrhythmias: Secondary | ICD-10-CM

## 2018-07-28 DIAGNOSIS — I951 Orthostatic hypotension: Secondary | ICD-10-CM

## 2018-07-28 DIAGNOSIS — R442 Other hallucinations: Secondary | ICD-10-CM

## 2018-07-28 DIAGNOSIS — R400 Somnolence: Secondary | ICD-10-CM | POA: Diagnosis not present

## 2018-07-28 NOTE — Patient Instructions (Signed)
I believe you may have an underlying sleepiness condition. This means, that you have a sleep disorder that manifests with excessive sleep and excessive sleepiness during the day.  We will do additional testing at this point: I would like for you to come back for an overnight sleep study during which we will monitor your night time sleep and we will do nap study testing the next day: 5 scheduled 20 min nap opportunities, every 2 hours. We will remind you to stay awake in between naps.   As explained, you will have to be off of any excessive caffeine or stimulant or antidepressant medication in preparation for the sleep studies.   Talk to [facility] about tapering off your Adderall XR. You should lower it first and then should stay completely off of it for at least 2 weeks prior to testing, and you can restart the next day after testing is completed.   We will call you to schedule your studies.

## 2018-07-28 NOTE — Progress Notes (Signed)
Subjective:    Patient ID: Teresa Garcia is a 38 y.o. female.  HPI     Huston FoleySaima Cordai Rodrigue, MD, PhD [facility] [address], [address] [address] [place], [place] [address]  Dear Dr. Denyse Amassorey,   I saw your patient, Teresa Garcia, upon your kind request in my sleep clinic today for initial consultation of her sleep disorder, in particular, her daytime somnolence. The patient is unaccompanied today. As you know, Teresa Garcia is a 38 year old right-handed woman with an underlying medical history of orthostasis and POTS, low back pain, irritable bowel syndrome, Raynaud's disease, and restless leg syndrome, who reports a several year history of excessive daytime somnolence, dating back to her teenage years. She does remember falling asleep in class at times. She has previously been labeled as depressed and treated for depression but feels like she is not depressed. She does have a history of anxiety. She is currently not taking the Vistaril. She has been labeled as chronic fatigue. She has been late to work a few months ago and this prompted her to seek more serious medical attention for her sleepiness. She was started on Adderall long-acting 20 mg once daily in May 2019. This was increased to 30 mg in September 2019. She has taken longer, up to 3 hours at a time. Therefore, she avoids taking a nap because she cannot take a short nap typically. She reports being able to recall dreams. She has some vivid dreams but no nightmares. She has been noted to have talk in her sleep. She denies any sleep paralysis but has had hypnagogic hallucinations or dream like sequences that she can see before drifting off to sleep. She may have had cataplexy type weakness in the context of feeling severely tired. She feels like her legs could give out or she will fall. She has had presyncopal and syncopal spells especially in hot environments or with sustained standing. She had workup from a cardiac standpoint  including echocardiogram in December 2019 and a recent Holter monitor in January 2020. I reviewed your office note from 05/29/2018. Her Epworth sleepiness score is 16 out of 24 today, fatigue score is 58 out of 63. She lives with her fianc and her 2 sons, ages 7818 and 6010, and 1 daughter, age 37. She has a 37 year old stepson. She works full-time in [employer]. She is currently on Adderall 30 mg daily. She quit smoking cigarettes about a year and a half ago. She uses nicotine vapor occasionally. She does not drink alcohol currently, has had none in over one year, caffeine daily, usually one serving, rarely up to 3 servings per day. She is in bed typically around 10 or 10:30, rise time is 6:15. They have 1 dog in the household, not upstairs in their bedroom typically. She has no family history of sleep apnea or narcolepsy. She was recently given a prescription for nadolol but has not started it. She occasionally takes meloxicam but has not taken the hydroxyzine.  Her Past Medical History Is Significant For: Past Medical History:  Diagnosis Date  . Chronic bilateral low back pain without sciatica 08/04/2017  . Cubital tunnel syndrome on right 05/02/2017  . Depression, recurrent (HCC) 03/26/2017   04/08/2017 PHQ9 = 17, GAD7 = 18 05/29/2017 PHQ 9 = 2, GAD 7 = 4  . Mixed irritable bowel syndrome 05/29/2017  . Raynaud's disease without gangrene 08/04/2017  . RLS (restless legs syndrome) 08/04/2017    Her Past Surgical History Is Significant For: Past Surgical History:  Procedure Laterality Date  . TUBAL LIGATION      Her Family History Is Significant For: Family History  Problem Relation Age of Onset  . Diabetes Paternal Grandmother   . Diabetes Maternal Grandmother     Her Social History Is Significant For: Social History   Socioeconomic History  . Marital status: Significant Other    Spouse name: Not on file  . Number of children: Not on file  . Years of education: Not on file  . Highest education  level: Not on file  Occupational History  . Not on file  Social Needs  . Financial resource strain: Not on file  . Food insecurity:    Worry: Not on file    Inability: Not on file  . Transportation needs:    Medical: Not on file    Non-medical: Not on file  Tobacco Use  . Smoking status: Current Every Day Smoker    Packs/day: 0.25    Types: E-cigarettes  . Smokeless tobacco: Never Used  Substance and Sexual Activity  . Alcohol use: Yes    Comment: Socially  . Drug use: No  . Sexual activity: Yes    Birth control/protection: Surgical  Lifestyle  . Physical activity:    Days per week: Not on file    Minutes per session: Not on file  . Stress: Not on file  Relationships  . Social connections:    Talks on phone: Not on file    Gets together: Not on file    Attends religious service: Not on file    Active member of club or organization: Not on file    Attends meetings of clubs or organizations: Not on file    Relationship status: Not on file  Other Topics Concern  . Not on file  Social History Narrative  . Not on file    Her Allergies Are:  Allergies  Allergen Reactions  . Cymbalta [Duloxetine Hcl]     Made exhaustion worse.   Dustin Flock [Vortioxetine]     Increasing IBS symptoms.   . Wellbutrin [Bupropion] Other (See Comments)    Worsening of symptoms  :   Her Current Medications Are:  Outpatient Encounter Medications as of 07/28/2018  Medication Sig  . amphetamine-dextroamphetamine (ADDERALL XR) 30 MG 24 hr capsule Take 1 capsule (30 mg total) by mouth every morning.  . hydrOXYzine (ATARAX/VISTARIL) 10 MG tablet TAKE 1 TABLET BY MOUTH THREE TIMES A DAY AS NEEDED  . meloxicam (MOBIC) 15 MG tablet One tab PO qAM with breakfast for 2 weeks, then daily prn pain.  . Milnacipran HCl 12.5 & 25 & 50 MG MISC Use as directed  . nadolol (CORGARD) 20 MG tablet Take 1 tablet (20 mg total) by mouth daily. (Patient not taking: Reported on 07/28/2018)   No  facility-administered encounter medications on file as of 07/28/2018.   : Review of Systems:  Out of a complete 14 point review of systems, all are reviewed and negative with the exception of these symptoms as listed below:  Review of Systems  Neurological:       Pt presents today to discuss her sleep. Pt has never had a sleep study and does not endorse snoring. She reports that she fights drowsiness frequently.  Epworth Sleepiness Scale 0= would never doze 1= slight chance of dozing 2= moderate chance of dozing 3= high chance of dozing  Sitting and reading: 3 Watching TV: 3 Sitting inactive in a public place (ex. Theater or meeting): 2  As a passenger in a car for an hour without a break: 2 Lying down to rest in the afternoon: 3 Sitting and talking to someone: 1 Sitting quietly after lunch (no alcohol): 2 In a car, while stopped in traffic: 0 Total: 16     Objective:  Neurological Exam  Physical Exam Physical Examination:   Vitals:   07/28/18 0856  BP: 123/84  Pulse: 89   General Examination: The patient is a very pleasant 38 y.o. female in no acute distress. She appears well-developed and well-nourished and well groomed. Mildly anxious appearing. No lightheadedness currently.   HEENT: Normocephalic, atraumatic, pupils are equal, round and reactive to light and accommodation. Extraocular tracking is good without limitation to gaze excursion or nystagmus noted. Normal smooth pursuit is noted. Hearing is grossly intact. Face is symmetric with normal facial animation and normal facial sensation. Speech is clear with no dysarthria noted. There is no hypophonia. There is no lip, neck/head, jaw or voice tremor. Neck with FROM. There are no carotid bruits on auscultation. Oropharynx exam reveals: mild mouth dryness, good dental hygiene and mild airway crowding, due to smaller airway entry, longer uvula and tonsils in place. Tonsils are 1+ bilaterally. Mallampati is class I. Neck is a  slender 11-7/8 inches. Tongue protrudes centrally and palate elevates symmetrically.  Chest: Clear to auscultation without wheezing, rhonchi or crackles noted.  Heart: S1+S2+0, regular and normal without murmurs, rubs or gallops noted.   Abdomen: Soft, non-tender and non-distended with normal bowel sounds appreciated on auscultation.  Extremities: There is no pitting edema in the distal lower extremities bilaterally.   Skin: Warm and dry without trophic changes noted.  Musculoskeletal: exam reveals no obvious joint deformities, tenderness or joint swelling or erythema.   Neurologically:  Mental status: The patient is awake, alert and oriented in all 4 spheres. Her immediate and remote memory, attention, language skills and fund of knowledge are appropriate. There is no evidence of aphasia, agnosia, apraxia or anomia. Speech is clear with normal prosody and enunciation. Thought process is linear. Mood is normal and affect is normal.  Cranial nerves II - XII are as described above under HEENT exam. In addition: shoulder shrug is normal with equal shoulder height noted. Motor exam: Normal bulk, strength and tone is noted. There is no drift, tremor or rebound. Romberg is negative. Reflexes are 2+ throughout. Fine motor skills and coordination: intact with normal finger taps, normal hand movements, normal rapid alternating patting, normal foot taps and normal foot agility.  Cerebellar testing: No dysmetria or intention tremor on finger to nose testing. Heel to shin is unremarkable bilaterally. There is no truncal or gait ataxia.  Sensory exam: intact to light touch in the upper and lower extremities.  Gait, station and balance: She stands easily. No veering to one side is noted. No leaning to one side is noted. Posture is age-appropriate and stance is narrow based. Gait shows normal stride length and normal pace. No problems turning are noted. Tandem walk is unremarkable.   Assessment and Plan:  In  summary, Teresa Garcia is a 38 year old right-handed woman with an underlying medical history of orthostasis and POTS, low back pain, irritable bowel syndrome, Raynaud's disease, and restless leg syndrome, who presents for evaluation of her sleepiness disorder. Differential diagnosis includes narcolepsy with cataplexy and idiopathic hypersomnolence. Of note, she reports very little snoring. Sleep apnea would be unlikely in the context of slender build, slender and excised and no telltale symptoms.I had a long discussion with the  patient today regarding her symptoms and the possible underlying sleep disorders. The patient is advised to proceed with extended sleep study testing in the form of nocturnal polysomnogram, followed by a next day nap study, called MSLT (mean sleep latency test). I explained the sleep study procedures to the patient. In preparation for sleep study testing she has to be off of any prescription stimulants or psychotropic medications including the adderall XR. She has to be off of this medication for at least 2 weeks prior to sleep study testing. She is advised to keep a set schedule for her sleep in preparation for sleep study testing as well as limit and stabilize caffeine intake. I explained to the patient that should the overnight PSG reveal obstructive sleep apnea we will forego the next day nap study, as we will have to treat the underlying sleep disordered breathing first. I will see her back after sleep testing is completed. She is going to talk to you or Tandy GawJade Breeback, PA regarding the tapering of the stimulant. We will call her from the sleep lab to arrange for her sleep study appointments and she can then taper her medications accordingly. I will see her back after testing is completed. I answered all her questions today and she was in agreement with the plan.   Thank you very much for allowing me to participate in the care of this nice patient. If I can be of any further assistance to  you please do not hesitate to call me at [phone].  Sincerely,   Huston FoleySaima Nayan Proch, MD, PhD

## 2018-07-30 ENCOUNTER — Encounter: Payer: Self-pay | Admitting: Physician Assistant

## 2018-07-31 MED ORDER — AMPHETAMINE-DEXTROAMPHET ER 20 MG PO CP24: 20.0000 mg | ORAL_CAPSULE | ORAL | 0 refills | Status: DC

## 2018-08-03 MED ORDER — AMPHETAMINE-DEXTROAMPHET ER 20 MG PO CP24: 20.0000 mg | ORAL_CAPSULE | ORAL | 0 refills | Status: DC

## 2018-08-03 NOTE — Addendum Note (Signed)
Addended by: Mallie Snooks R on: 08/03/2018 04:29 PM   Modules accepted: Orders

## 2018-08-04 MED FILL — ADDERALL XR 20 MG CAP SA: 20 | 30 days supply | Qty: 30 | Fill #0

## 2018-08-13 ENCOUNTER — Encounter: Payer: Self-pay | Admitting: Internal Medicine

## 2018-08-13 ENCOUNTER — Ambulatory Visit: Payer: No Typology Code available for payment source | Admitting: Internal Medicine

## 2018-08-13 VITALS — BP 117/77 | HR 82 | Ht 64.0 in | Wt 130.4 lb

## 2018-08-13 DIAGNOSIS — R000 Tachycardia, unspecified: Secondary | ICD-10-CM | POA: Diagnosis not present

## 2018-08-13 DIAGNOSIS — G90A Postural orthostatic tachycardia syndrome (POTS): Secondary | ICD-10-CM

## 2018-08-13 DIAGNOSIS — I951 Orthostatic hypotension: Secondary | ICD-10-CM

## 2018-08-13 DIAGNOSIS — I498 Other specified cardiac arrhythmias: Secondary | ICD-10-CM

## 2018-08-13 NOTE — Progress Notes (Signed)
ELECTROPHYSIOLOGY CONSULT NOTE  Patient ID: Teresa Garcia, MRN: [id number], DOB/AGE: 1981-06-14 38 y.o. Admit date: (Not on file) Date of Consult: 08/13/2018  Primary Physician: Jomarie Longs, PA-C Primary Cardiologist: Chaniah Tokash is a 38 y.o. female who is being seen today for the evaluation of tachycardia at the request of Dr Genene Churn.    HPI Teresa Garcia is a 38 y.o. female with a lifelong history of significant fatigue lower body aching and orthostatic intolerance.  She has struggled with shower intolerance, Jacuzzi intolerance, exercise intolerance, heat intolerance, menses intolerance.  She is not fit and doesn't exercise.  Salt intake is modest but not exuberant.  Fluid intake is similar.  No use of alcohol or marijuana.  Has had presyncope particularly with showers.  She showers in the morning.  Notices blotchiness in her legs with prolonged showers.  Showers are associated with profound fatigue.  No history of joint hypermobility.  Interestingly, her daughter is also struggling with orthostatic intolerance.  Longstanding and recurrent depression.  Acknowledges grief of the loss is associated with long-term illness.  Referred initially to Dr. Genene Churn because of tachypalpitations as well as an ECG that demonstrated biatrial enlargement.  Echocardiogram demonstrated normal function and structure.  Holter monitoring was personally reviewed and demonstrated exuberant heart rate swings and mean heart rates during the day are greater than 100-110.  She takes Adderall.   Past Medical History:  Diagnosis Date  . Chronic bilateral low back pain without sciatica 08/04/2017  . Cubital tunnel syndrome on right 05/02/2017  . Depression, recurrent (HCC) 03/26/2017   04/08/2017 PHQ9 = 17, GAD7 = 18 05/29/2017 PHQ 9 = 2, GAD 7 = 4  . Mixed irritable bowel syndrome 05/29/2017  . Raynaud's disease without gangrene 08/04/2017  . RLS (restless legs syndrome) 08/04/2017       Surgical History:  Past Surgical History:  Procedure Laterality Date  . TUBAL LIGATION       Home Meds: Current Meds  Medication Sig  . amphetamine-dextroamphetamine (ADDERALL XR) 20 MG 24 hr capsule Take 1 capsule (20 mg total) by mouth every morning.  . hydrOXYzine (ATARAX/VISTARIL) 10 MG tablet TAKE 1 TABLET BY MOUTH THREE TIMES A DAY AS NEEDED  . meloxicam (MOBIC) 15 MG tablet Take 15 mg by mouth daily as needed for pain.    Allergies:  Allergies  Allergen Reactions  . Cymbalta [Duloxetine Hcl]     Made exhaustion worse.   Dustin Flock [Vortioxetine]     Increasing IBS symptoms.   . Wellbutrin [Bupropion] Other (See Comments)    Worsening of symptoms    Social History   Socioeconomic History  . Marital status: Significant Other    Spouse name: Not on file  . Number of children: Not on file  . Years of education: Not on file  . Highest education level: Not on file  Occupational History  . Not on file  Social Needs  . Financial resource strain: Not on file  . Food insecurity:    Worry: Not on file    Inability: Not on file  . Transportation needs:    Medical: Not on file    Non-medical: Not on file  Tobacco Use  . Smoking status: Current Every Day Smoker    Packs/day: 0.25    Types: E-cigarettes  . Smokeless tobacco: Never Used  Substance and Sexual Activity  . Alcohol use: Yes    Comment: Socially  . Drug use: No  .  Sexual activity: Yes    Birth control/protection: Surgical  Lifestyle  . Physical activity:    Days per week: Not on file    Minutes per session: Not on file  . Stress: Not on file  Relationships  . Social connections:    Talks on phone: Not on file    Gets together: Not on file    Attends religious service: Not on file    Active member of club or organization: Not on file    Attends meetings of clubs or organizations: Not on file    Relationship status: Not on file  . Intimate partner violence:    Fear of current or ex partner:  Not on file    Emotionally abused: Not on file    Physically abused: Not on file    Forced sexual activity: Not on file  Other Topics Concern  . Not on file  Social History Narrative  . Not on file     Family History  Problem Relation Age of Onset  . Diabetes Paternal Grandmother   . Diabetes Maternal Grandmother      ROS:  Please see the history of present illness.     All other systems reviewed and negative.    Physical Exam: Blood pressure 117/77, pulse 82, height 5\' 4"  (1.626 m), weight 130 lb 6.4 oz (59.1 kg), SpO2 99 %. General: Well developed, well nourished female in no acute distress. Head: Normocephalic, atraumatic, sclera non-icteric, no xanthomas, nares are without discharge. EENT: normal  Lymph Nodes:  none Neck: Negative for carotid bruits. JVD not elevated. Back:without scoliosis kyphosis Lungs: Clear bilaterally to auscultation without wheezes, rales, or rhonchi. Breathing is unlabored. Heart: RRR with S1 S2. No  murmur . No rubs, or gallops appreciated. Abdomen: Soft, non-tender, non-distended with normoactive bowel sounds. No hepatomegaly. No rebound/guarding. No obvious abdominal masses. Msk:  Strength and tone appear normal for age. Extremities: No clubbing or cyanosis. No edema.  distal pedal pulses are 2+ and equal bilaterally. Joint hypermobility with a positive thumb signs x2 Skin: Warm and Dry Neuro: Alert and oriented X 3. CN III-XII intact Grossly normal sensory and motor function . Psych:  Responds to questions appropriately with a normal affect.      Labs: Cardiac Enzymes No results for input(s): CKTOTAL, CKMB, TROPONINI in the last 72 hours. CBC Lab Results  Component Value Date   WBC 4.5 08/01/2017   HGB 12.6 03/20/2018   HCT 39.6 08/01/2017   MCV 90.4 08/01/2017   PLT 277 08/01/2017   PROTIME: No results for input(s): LABPROT, INR in the last 72 hours. Chemistry No results for input(s): NA, K, CL, CO2, BUN, CREATININE, CALCIUM,  PROT, BILITOT, ALKPHOS, ALT, AST, GLUCOSE in the last 168 hours.  Invalid input(s): LABALBU Lipids Lab Results  Component Value Date   CHOL 173 08/01/2017   HDL 69 08/01/2017   LDLCALC 95 08/01/2017   TRIG 32 08/01/2017   BNP No results found for: PROBNP Thyroid Function Tests: No results for input(s): TSH, T4TOTAL, T3FREE, THYROIDAB in the last 72 hours.  Invalid input(s): FREET3 Miscellaneous No results found for: DDIMER  Radiology/Studies:  No results found.  EKG: ECG from 12/19 was personally reviewed agree with the interpretation of possible biatrial enlargement   Assessment and Plan:   Autonomic insufficiency but not meet criteria for POTS  Fatigue  Joint hypermobility   Depression   Patient has significant problems with orthostatic intolerance and associated with fatigue.  She is anticipated to have  a sleep study; I would be surprised if it is abnormal but certainly would be nice for her to work and offered a therapeutic target.  We discussed extensively the issues of dysautonomia, the physiology of orthstasis and positional stress.  We discussed the role of salt and water repletion, the importance of exercise, often needing to be started in the recumbent position, and the awareness of triggers and the role of ambient heat and dehydration  She also has modest joint hypermobility with a positive thumb sign.  This may be further aggravating her dysautonomia and may be contributing to her lower extremity pain.  We also discussed the importance of care of her depression and dealing with the grief and the challenges of chronic illness.  I suggested [facility].  More than 50% of 80 min was spent in counseling related to the above       Sherryl Manges

## 2018-08-13 NOTE — Patient Instructions (Addendum)
Medication Instructions:  Your physician recommends that you continue on your current medications as directed. Please refer to the Current Medication list given to you today.  Labwork: None ordered.  Testing/Procedures: None ordered.  Follow-Up: Your physician recommends that you schedule a follow-up appointment in:   April 17 @ 10:00AM  Any Other Special Instructions Will Be Listed Below (If Applicable).  *Begin wearing compression wear *Stay Hydrated and increase your salt intake *Recumbant Exercise    If you need a refill on your cardiac medications before your next appointment, please call your pharmacy.

## 2018-08-31 ENCOUNTER — Ambulatory Visit (INDEPENDENT_AMBULATORY_CARE_PROVIDER_SITE_OTHER): Payer: No Typology Code available for payment source | Admitting: Neurology

## 2018-08-31 DIAGNOSIS — G478 Other sleep disorders: Secondary | ICD-10-CM

## 2018-08-31 DIAGNOSIS — I498 Other specified cardiac arrhythmias: Secondary | ICD-10-CM

## 2018-08-31 DIAGNOSIS — G4710 Hypersomnia, unspecified: Secondary | ICD-10-CM | POA: Diagnosis not present

## 2018-08-31 DIAGNOSIS — G90A Postural orthostatic tachycardia syndrome (POTS): Secondary | ICD-10-CM

## 2018-08-31 DIAGNOSIS — I951 Orthostatic hypotension: Secondary | ICD-10-CM

## 2018-08-31 DIAGNOSIS — R442 Other hallucinations: Secondary | ICD-10-CM

## 2018-08-31 DIAGNOSIS — R6889 Other general symptoms and signs: Secondary | ICD-10-CM

## 2018-08-31 DIAGNOSIS — G47411 Narcolepsy with cataplexy: Secondary | ICD-10-CM

## 2018-08-31 DIAGNOSIS — R000 Tachycardia, unspecified: Secondary | ICD-10-CM

## 2018-08-31 DIAGNOSIS — R400 Somnolence: Secondary | ICD-10-CM

## 2018-09-01 ENCOUNTER — Encounter (INDEPENDENT_AMBULATORY_CARE_PROVIDER_SITE_OTHER): Payer: No Typology Code available for payment source | Admitting: Neurology

## 2018-09-01 ENCOUNTER — Other Ambulatory Visit: Payer: Self-pay

## 2018-09-01 DIAGNOSIS — R400 Somnolence: Secondary | ICD-10-CM

## 2018-09-01 DIAGNOSIS — R000 Tachycardia, unspecified: Secondary | ICD-10-CM

## 2018-09-01 DIAGNOSIS — G478 Other sleep disorders: Secondary | ICD-10-CM

## 2018-09-01 DIAGNOSIS — G4710 Hypersomnia, unspecified: Secondary | ICD-10-CM

## 2018-09-01 DIAGNOSIS — G47411 Narcolepsy with cataplexy: Secondary | ICD-10-CM | POA: Diagnosis not present

## 2018-09-01 DIAGNOSIS — R442 Other hallucinations: Secondary | ICD-10-CM

## 2018-09-01 DIAGNOSIS — G90A Postural orthostatic tachycardia syndrome (POTS): Secondary | ICD-10-CM

## 2018-09-01 DIAGNOSIS — I951 Orthostatic hypotension: Secondary | ICD-10-CM

## 2018-09-01 DIAGNOSIS — R6889 Other general symptoms and signs: Secondary | ICD-10-CM

## 2018-09-01 DIAGNOSIS — I498 Other specified cardiac arrhythmias: Secondary | ICD-10-CM

## 2018-09-01 NOTE — Addendum Note (Signed)
Addended by: Tamera Stands D on: 09/01/2018 04:40 PM   Modules accepted: Orders

## 2018-09-01 NOTE — Addendum Note (Signed)
Addended by: Geronimo Running A on: 09/01/2018 04:31 PM   Modules accepted: Orders

## 2018-09-05 LAB — COMPREHENSIVE DRUG ANALYSIS,UR

## 2018-09-08 ENCOUNTER — Telehealth: Payer: Self-pay | Admitting: Neurology

## 2018-09-08 ENCOUNTER — Telehealth: Payer: Self-pay

## 2018-09-08 NOTE — Procedures (Signed)
Name:  Teresa Garcia, Teresa Garcia Reference [id number]  Study Date: 09/01/2018 Procedure #: 3089  DOB: 12-Jan-1981    HISTORY: 38 year old woman with a history of orthostasis and POTS, low back pain, irritable bowel syndrome, Raynaud's disease, and restless leg syndrome, who reports a several year history of excessive daytime somnolence, dating back to her teenage years. The patient endorsed the Epworth Sleepiness Scale at 16 points. The patient's weight 130 pounds with a height of 64 (inches), resulting in a BMI of 22.2 kg/m2. The patient's neck circumference measured 11.8 inches. The patient presents for a nocturnal polysomnogram on 08/31/18 with next day nap study. A UDS on 09/01/18 was negative, except for presence of ibuprofen.  Protocol  This is a 13 channel Multiple Sleep Latency Test comprised of 5 channels of EEG (T3-Cz, Cz-T4, F4-M1, C4-M1, O2-M1), 3 channels of Chin EMG, 4 channels of EOG and 1 channel for ECG.   All channels were sampled at _0 .    This polysomnographic procedure is designed to evaluate (1) the complaint of excessive daytime sleepiness by quantifying the time required to fall asleep and (2) the possibility of narcolepsy by checking for abnormally short latencies to REM sleep.  Electrographic variables include EEG, EMG, EOG and ECG.  Patients are monitored throughout four or five 20-minute opportunities to sleep (naps) at two-hour intervals.  For each nap, the patient is allowed 20 minutes to fall asleep.  Once asleep, the patient is awakened after 15 minutes.  Between naps, the patient is kept as alert as possible.  A sleep latency of 20 minutes indicates that no sleep occurred.  Parametric Analysis  Total Number of Naps 5     NAP # Time of Nap  Sleep Latency (mins) REM Latency (mins) Sleep Time Percent Awake Time Percent  1 07:_1 55 45   2 09:25 8.5 0 61  39   3 11:26 3.5 0 54  46   4 13:26 3.5 0 83  17   5 15:30 6 0 73  27    MSLT Summary of Naps  Sleepiness  Index: 74.5  Mean Sleep Latency to all Five Naps: 5.1  Mean Sleep Latency to First Four Naps: 4.9  Mean Sleep Latency to First Three Naps: 5.3  Mean Sleep Latency to First Two Naps: 6.25  Number of Naps with REM Sleep: 1    Results from Preceding PSG Study  Sleep Onset Time 22:03 Sleep Efficiency (%) 91.1%  Rise Time 05:59 Sleep Latency (min) 18  Total Sleep Time  451 min  REM Latency (min) 71 min         Name:  Teresa Garcia, Teresa Garcia Reference #:  [id number]  Study Date: 09/01/2018 DOB: [birth year]    Interpretation:  This MSLT shows a mean sleep latency of 5.1 minutes for 5 naps and 1 SOREMP (sleep onset REM period) noted, namely during nap # 1. This indicates a significant degree of sleepiness and in the right clinical context, may support the diagnosis of narcolepsy. The diagnostic requirement for narcolepsy is a mean sleep latency for the MLST of less than 8 minutes and 2 or more SOREMPs.   IMPRESSION:  1. 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 The overnight polysomnogram does not demonstrate any significant obstructive or central sleep disordered breathing.  2. This overnight sleep study and next day MSLT/nap study demonstrate a normal percentage of REM sleep during the overnight study with a low normal REM latency of 71 min, and a  reduced mean sleep latency of 5.1 minutes, and 1 SOREMP (sleep onset REM period) for the nap study. The MSLT findings do not exactly fulfill diagnostic criteria for narcolepsy; nevertheless, these findings, along with the patient's clinical history could support the diagnosis of narcolepsy (possibly with cataplexy, by history). The main differential diagnosis would be idiopathic hypersomnolence (MSLT criteria would include mean sleep latency of less than 8 minutes and less than 2 SOREMPs). Checking the narcolepsy HLA may be helpful in further defining the underlying sleepiness disorder. Treatment  options for narcolepsy should be discussed with the patient.  3. The patient should be cautioned not to drive, work at heights, or operate dangerous or heavy equipment when tired or sleepy. Review and reiteration of good sleep hygiene measures should be pursued with any patient. 4. The patient will be seen in follow-up by Dr. Rexene Alberts at [facility] for discussion of the test results and further management strategies. The referring provider will be notified of the test results.   I certify that I have reviewed the entire raw data recording prior to the issuance of this report in accordance with the Standards of Accreditation of the American Academy of Sleep Medicine (AASM)  Star Age, MD, PhD Diplomat, American Board of Neurology and Sleep Medicine (Neurology and Sleep Medicine)

## 2018-09-08 NOTE — Telephone Encounter (Signed)
Pt states she received a letter in the mail about a missed appointment for lab work.  Pt states she was unaware she was scheduled for lab work.  Pt is asking for a call to discuss.

## 2018-09-08 NOTE — Telephone Encounter (Signed)
I called pt and discussed her sleep study results. Pt is agreeable to a virtual visit on 09/16/2018 at 1:00pm. Pt's has access to [employer]. Pt's email is [email or web address]. Pt verbalized understanding of results. Pt had no questions at this time but was encouraged to call back if questions arise.

## 2018-09-08 NOTE — Telephone Encounter (Signed)
-----   Message from Huston Foley, MD sent at 09/08/2018  9:49 AM EDT ----- Patient referred by Dr. Denyse Amass, seen by me on 07/28/18, diagnostic PSG on 3/16, MSLT on 09/01/18.   Please call and notify the patient that the recent sleep study and next day nap study did show significant sleepiness, with the main differential Dx of narcolepsy vs Idiopathic hypersomnolence. No other sleep disorder, in particular no OSA.  I would like to do a virtual visit for FU to discuss results and treatment options. Please arrange a virtual followup appointment, when I am up and running, maybe next week or onwards? Also, route or fax report to referring MD.   Thanks,  Huston Foley, MD, PhD [facility]

## 2018-09-08 NOTE — Telephone Encounter (Signed)
Patient has been called and I explained to her that she should disregard the no-show letter due to the fact that she took her urine drug test the day of her sleep study. Patient understood.

## 2018-09-08 NOTE — Progress Notes (Signed)
Patient referred by Dr. Denyse Amass, seen by me on 07/28/18, diagnostic PSG on 3/16, MSLT on 09/01/18.   Please call and notify the patient that the recent sleep study and next day nap study did show significant sleepiness, with the main differential Dx of narcolepsy vs Idiopathic hypersomnolence. No other sleep disorder, in particular no OSA.  I would like to do a virtual visit for FU to discuss results and treatment options. Please arrange a virtual followup appointment, when I am up and running, maybe next week or onwards? Also, route or fax report to referring MD.   Thanks,  Huston Foley, MD, PhD [facility] ([facility]

## 2018-09-08 NOTE — Procedures (Signed)
PATIENT'S NAME:  Teresa Garcia, Gadbois DOB:      Dec 17, 1980      MR#:    [id number]     DATE OF RECORDING: 08/31/2018 REFERRING M.D.:  Iran Planas, PA-C Study Performed:   Baseline Polysomnogram HISTORY: 38 year old woman with a history of orthostasis and POTS, low back pain, irritable bowel syndrome, Raynaud's disease, and restless leg syndrome, who reports a several year history of excessive daytime somnolence, dating back to her teenage years. The patient endorsed the Epworth Sleepiness Scale at 16 points. The patient's weight 130 pounds with a height of 64 (inches), resulting in a BMI of 22.2 kg/m2. The patient's neck circumference measured 11.8 inches. The patient presents for a nocturnal polysomnogram with next day nap study. A UDS on 09/01/18 was negative, except for presence of ibuprofen.  CURRENT MEDICATIONS: Adderall, Vistaril, Mobic, Corgard   PROCEDURE:  This is a multichannel digital polysomnogram utilizing the Somnostar 11.2 system.  Electrodes and sensors were applied and monitored per AASM Specifications.   EEG, EOG, Chin and Limb EMG, were sampled at 200 Hz.  ECG, Snore and Nasal Pressure, Thermal Airflow, Respiratory Effort, CPAP Flow and Pressure, Oximetry was sampled at 50 Hz. Digital video and audio were recorded.      BASELINE STUDY  Lights Out was at 21:45 and Lights On at 05:59.  Total recording time (TRT) was 495 minutes, with a total sleep time (TST) of 451 minutes.   The patient's sleep latency was 18 minutes.  REM latency was 71 minutes, which is low normal. The sleep efficiency was 91.1%.     SLEEP ARCHITECTURE: WASO (Wake after sleep onset) was 26 minutes with minimal to mild sleep fragmentation noted. There were 23.5 minutes in Stage N1, 151.5 minutes Stage N2, 172.5 minutes Stage N3 and 103.5 minutes in Stage REM.  The percentage of Stage N1 was 5.2%, Stage N2 was 33.6%, Stage N3 was 38.2% and Stage R (REM sleep) was 22.9%, which is normal. The arousals were noted as: 51 were  spontaneous, 0 were associated with PLMs, 0 were associated with respiratory events.  RESPIRATORY ANALYSIS:  There were a total of 1 respiratory events:  0 obstructive apneas, 1 central apneas and 0 mixed apneas with a total of 1 apneas and an apnea index (AI) of .1 /hour. There were 0 hypopneas with a hypopnea index of 0 /hour. The patient also had 0 respiratory event related arousals (RERAs).      The total APNEA/HYPOPNEA INDEX (AHI) was .1 /hour and the total RESPIRATORY DISTURBANCE INDEX was 0. .1 /hour.  1 events occurred in REM sleep and 0 events in NREM. The REM AHI was 0. .6 /hour, versus a non-REM AHI of 0. The patient spent 14.5 minutes of total sleep time in the supine position and 437 minutes in non-supine.. The supine AHI was 0.0 versus a non-supine AHI of 0.1.  OXYGEN SATURATION & C02:  The Wake baseline 02 saturation was 95%, with the lowest being 93%. Time spent below 89% saturation equaled 0 minutes.  PERIODIC LIMB MOVEMENTS: The patient had a total of 0 Periodic Limb Movements.  The Periodic Limb Movement (PLM) index was 0 and the PLM Arousal index was 0/hour.  Audio and video analysis did not show any abnormal or unusual movements, behaviors, phonations or vocalizations. The patient took no bathroom breaks. No significant snoring was noted. The EKG was in keeping with normal sinus rhythm (NSR).  Post-study, the patient indicated that sleep was the same as usual.  The patient had a nap study, MSLT, next day on 09/01/18 with a mean sleep latency of 5.1 minutes for 5 naps and 1 SOREMP (sleep onset REM period) noted, namely during nap # 1. This indicates a significant degree of sleepiness and in the right clinical context, may support the diagnosis of narcolepsy. The diagnostic requirement for narcolepsy is a mean sleep latency for the MLST of less than 8 minutes and 2 or more SOREMPs.  IMPRESSION:  1. 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 The overnight polysomnogram does not demonstrate any significant obstructive or central sleep disordered breathing.  2. This overnight sleep study and next day MSLT/nap study demonstrate a normal percentage of REM sleep during the overnight study with a low normal REM latency of 71 min, and a reduced mean sleep latency of 5.1 minutes, and 1 SOREMP (sleep onset REM period) for the nap study. The MSLT findings do not exactly fulfill diagnostic criteria for narcolepsy; nevertheless, these findings, along with the patient's clinical history could support the diagnosis of narcolepsy (possibly with cataplexy, by history). The main differential diagnosis would be idiopathic hypersomnolence (MSLT criteria would include mean sleep latency of less than 8 minutes and less than 2 SOREMPs). Checking the narcolepsy HLA may be helpful in further defining the underlying sleepiness disorder. Treatment options for narcolepsy should be discussed with the patient.  3. The patient should be cautioned not to drive, work at heights, or operate dangerous or heavy equipment when tired or sleepy. Review and reiteration of good sleep hygiene measures should be pursued with any patient. 4. The patient will be seen in follow-up by Dr. Rexene Alberts at [facility] for discussion of the test results and further management strategies. The referring provider will be notified of the test results.   I certify that I have reviewed the entire raw data recording prior to the issuance of this report in accordance with the Standards of Accreditation of the American Academy of Sleep Medicine (AASM)  Star Age, MD, PhD Diplomat, American Board of Neurology and Sleep Medicine (Neurology and Sleep Medicine)

## 2018-09-08 NOTE — Telephone Encounter (Signed)
Please call pt and advise her that she submitted a urine drug test the day of her sleep study. She did not miss a lab appointment with [place] and should not have gotten a no show letter.

## 2018-09-14 ENCOUNTER — Other Ambulatory Visit: Payer: Self-pay | Admitting: Physician Assistant

## 2018-09-14 ENCOUNTER — Telehealth: Payer: Self-pay | Admitting: Physician Assistant

## 2018-09-14 MED ORDER — AMPHETAMINE-DEXTROAMPHET ER 20 MG PO CP24: 20.0000 mg | ORAL_CAPSULE | ORAL | 0 refills | Status: DC

## 2018-09-14 MED FILL — ADDERALL XR 20 MG CAP SA: 20 | 30 days supply | Qty: 30 | Fill #0

## 2018-09-14 NOTE — Telephone Encounter (Signed)
Needs refill of adderall for CFS. Hold until talks with neurology. She got the most benefit from this. Sleep study was negative.

## 2018-09-16 ENCOUNTER — Ambulatory Visit (INDEPENDENT_AMBULATORY_CARE_PROVIDER_SITE_OTHER): Payer: No Typology Code available for payment source | Admitting: Neurology

## 2018-09-16 ENCOUNTER — Other Ambulatory Visit: Payer: Self-pay

## 2018-09-16 ENCOUNTER — Encounter: Payer: Self-pay | Admitting: Neurology

## 2018-09-16 DIAGNOSIS — G4711 Idiopathic hypersomnia with long sleep time: Secondary | ICD-10-CM

## 2018-09-16 NOTE — Patient Instructions (Signed)
Given verbally, during today's virtual video-based encounter, with verbal feedback received.   

## 2018-09-16 NOTE — Progress Notes (Signed)
Interim history:  Teresa Garcia is a 38 year old right-handed woman with an underlying medical history of orthostasis and POTS, low back pain, irritable bowel syndrome, Raynauds disease, and restless leg syndrome, with whom I am conducting a virtual video based follow-up visit via WebEx in lieu of a face-to-face visit, for follow-up consultation of her sleep disorder, in particular her hypersomnolence, after interim sleep testing. The patient is unaccompanied today and joins from home.  I first met her at the request of Dr. Georgina Snell, at which time she reported a several year history of daytime somnolence, dating back to even teenage years. I suggested we proceed with extended sleep testing to rule out an underlying hypersomnolence disorder with narcolepsy within the possibilities. She had an interim baseline sleep study, followed by a next a nap study. I went over her test results with her in detail today. Her baseline sleep study from 08/31/2018 showed a sleep latency of 18 minutes, REM latency 71 minutes, sleep efficiency higher at 91.9% for a sleep study situation. She had minimal to mild sleep fragmentation, she had an increased percentage of slow-wave sleep and REM sleep was normal at 22.9%. She had no significant sleep disordered breathing with an AHI of 0.1 per hour, average oxygen saturation of 95%, nadir of 93%. She had no significant PLMS. She had a next a MSLT on 09/01/2018 with a mean sleep latency of 5.1 minutes for 5 and one sleep onset REM period noted during nap #1.  Today, 09/16/2018: Please see below for virtual visit encounter details.   The patient's allergies, current medications, family history, past medical history, past social history, past surgical history and problem list were reviewed and updated as appropriate.    Previously (copied from previous notes for reference):   07/28/2018: (She) reports a several year history of excessive daytime somnolence, dating back to her teenage  years. She does remember falling asleep in class at times. She has previously been labeled as depressed and treated for depression but feels like she is not depressed. She does have a history of anxiety. She is currently not taking the Vistaril. She has been labeled as chronic fatigue. She has been late to work a few months ago and this prompted her to seek more serious medical attention for her sleepiness. She was started on Adderall long-acting 20 mg once daily in May 2019. This was increased to 30 mg in September 2019. She has taken longer, up to 3 hours at a time. Therefore, she avoids taking a nap because she cannot take a short nap typically. She reports being able to recall dreams. She has some vivid dreams but no nightmares. She has been noted to have talk in her sleep. She denies any sleep paralysis but has had hypnagogic hallucinations or dream like sequences that she can see before drifting off to sleep. She may have had cataplexy type weakness in the context of feeling severely tired. She feels like her legs could give out or she will fall. She has had presyncopal and syncopal spells especially in hot environments or with sustained standing. She had workup from a cardiac standpoint including echocardiogram in December 2019 and a recent Holter monitor in January 2020. I reviewed your office note from 05/29/2018. Her Epworth sleepiness score is 16 out of 24 today, fatigue score is 58 out of 63. She lives with her fianc and her 2 sons, ages 19 and 46, and 1 daughter, age 57. She has a 79 year old stepson. She works full-time in [employer].  She is currently on Adderall 30 mg daily. She quit smoking cigarettes about a year and a half ago. She uses nicotine vapor occasionally. She does not drink alcohol currently, has had none in over one year, caffeine daily, usually one serving, rarely up to 3 servings per day. She is in bed typically around 10 or 10:30, rise time is 6:15. They have 1 dog in the household, not  upstairs in their bedroom typically. She has no family history of sleep apnea or narcolepsy. She was recently given a prescription for nadolol but has not started it. She occasionally takes meloxicam but has not taken the hydroxyzine.  Her Past Medical History Is Significant For: Past Medical History:  Diagnosis Date   Chronic bilateral low back pain without sciatica 08/04/2017   Cubital tunnel syndrome on right 05/02/2017   Depression, recurrent ([place]) 03/26/2017   04/08/2017 PHQ9 = 17, GAD7 = 18 05/29/2017 PHQ 9 = 2, GAD 7 = 4   Mixed irritable bowel syndrome 05/29/2017   Raynaud's disease without gangrene 08/04/2017   RLS (restless legs syndrome) 08/04/2017    Her Past Surgical History Is Significant For: Past Surgical History:  Procedure Laterality Date   TUBAL LIGATION      Her Family History Is Significant For: Family History  Problem Relation Age of Onset   Diabetes Paternal Grandmother    Diabetes Maternal Grandmother     Her Social History Is Significant For: Social History   Socioeconomic History   Marital status: Significant Other    Spouse name: Not on file   Number of children: Not on file   Years of education: Not on file   Highest education level: Not on file  Occupational History   Not on file  Social Needs   Financial resource strain: Not on file   Food insecurity:    Worry: Not on file    Inability: Not on file   Transportation needs:    Medical: Not on file    Non-medical: Not on file  Tobacco Use   Smoking status: Current Every Day Smoker    Packs/day: 0.25    Types: E-cigarettes   Smokeless tobacco: Never Used  Substance and Sexual Activity   Alcohol use: Yes    Comment: Socially   Drug use: No   Sexual activity: Yes    Birth control/protection: Surgical  Lifestyle   Physical activity:    Days per week: Not on file    Minutes per session: Not on file   Stress: Not on file  Relationships   Social connections:     Talks on phone: Not on file    Gets together: Not on file    Attends religious service: Not on file    Active member of club or organization: Not on file    Attends meetings of clubs or organizations: Not on file    Relationship status: Not on file  Other Topics Concern   Not on file  Social History Narrative   Not on file    Her Allergies Are:  Allergies  Allergen Reactions   Cymbalta [Duloxetine Hcl]     Made exhaustion worse.    Trintellix [Vortioxetine]     Increasing IBS symptoms.    Wellbutrin [Bupropion] Other (See Comments)    Worsening of symptoms  :   Her Current Medications Are:  Outpatient Encounter Medications as of 09/16/2018  Medication Sig   amphetamine-dextroamphetamine (ADDERALL XR) 20 MG 24 hr capsule Take 1 capsule (20 mg total)  by mouth every morning.   hydrOXYzine (ATARAX/VISTARIL) 10 MG tablet TAKE 1 TABLET BY MOUTH THREE TIMES A DAY AS NEEDED   meloxicam (MOBIC) 15 MG tablet Take 15 mg by mouth daily as needed for pain.   No facility-administered encounter medications on file as of 09/16/2018.   :  Review of Systems:  Out of a complete 14 point review of systems, all are reviewed and negative with the exception of these symptoms as listed below:  Virtual Visit via Video Note on 09/16/2018:   I connected with Teresa Garcia on 09/16/18 at  1:00 PM EDT by a video enabled telemedicine application and verified that I am speaking with the correct person using two identifiers.   I discussed the limitations of evaluation and management by telemedicine and the availability of in person appointments. The patient expressed understanding and agreed to proceed.  History of Present Illness:  She reports doing about the same, when she tapered the Adderall for her sleep study she realized that she could get away with 20 mg. She has restarted it, prescribed by Iran Planas, PA, generic Adderall XR 20 mg once daily. She saw cardiology, Dr. Caryl Comes on 08/13/2018 and  I reviewed the note. He felt that she did not have full fledged POTS, but significant orthostatic intolerance, in keeping with dysautonomia. He noticed some joint hypermobility. She was advised to be liberal with salt intake and water repletion. He stressed the importance of exercise, often to be started in the recumbent position. She denies any other telltale autonomic problems such as constipation, had a transient problem with it. She has no significant bladder issues but sometimes has to push to empty the bladder.   Observations/Objective:  Her most recent available vital signs from 08/13/2018 are reviewed in the chart: Blood pressure 117/77, pulse 82, weight 130.4 for a BMI of 22.38.  She is conversant, no acute distress, pleasant, speech clear, no dysarthria, no voice tremor. Extraocular movements are intact, face is symmetric. She is able to use both upper extremities without any problems.  Assessment and Plan: In summary,Ms. 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 Differential diagnosis may include narcolepsy but her sleep study results are more supportive of idiopathic hypersomnolence. She may benefit from HLA testing for Narcolepsy profile, we mutually agreed to pick up this discussion at our next actual visit in about 3 months. She is advised that HLA testing is not a definitive test for narcolepsy, but it may be helpful to have another data point. We talked about her nocturnal polysomnogram and next day nap study, called MSLT (mean sleep latency test). I explained the sleep study results to her in detail and she also had a paper  copy available that she had printed off her chart. She is back on Adderall XR generic, currently only at 20 mg daily and feels that this has been helpful and she can function well enough. She is currently working from home. Sherecently got a prescription from her primary care PA. She is comfortable maintaining this medicine at this level. We talked about potential alternative treatment options such as non-stimulant medications that are wake promoting including generic Provigil and generic Nuvigil, these can be used as an adjunct as well. At  this juncture, she is comfortable maintaining treatment with the generic Adderall long-acting 20 mg strength once daily. She is advised to follow-up routinely hopefully in a face-to-face visit in 3 months. We will consider proceeding with HLA testing at the time. I answered all her questions today and she was in agreement.  Follow Up Instructions: 1. Maintain symptomatic treatment with Adderall long-acting 20 mg once daily, prescription provided by primary care PA, Iran Planas. 2. Follow-up in 3 months.  3. Consider HLA testing for narcolepsy at the next office visit..    I discussed the assessment and treatment plan with the patient. The patient was provided an opportunity to ask questions and all were answered. The patient agreed with the plan and demonstrated an understanding of the instructions.   The patient was advised to call back or seek an in-person evaluation if the symptoms worsen or if the condition fails to improve as anticipated.  I provided 25 minutes of non-face-to-face time during this encounter.   Star Age, MD

## 2018-09-29 ENCOUNTER — Other Ambulatory Visit: Payer: Self-pay

## 2018-09-29 ENCOUNTER — Telehealth (INDEPENDENT_AMBULATORY_CARE_PROVIDER_SITE_OTHER): Payer: No Typology Code available for payment source | Admitting: Internal Medicine

## 2018-09-29 VITALS — BP 110/65 | HR 80 | Ht 64.0 in | Wt 132.0 lb

## 2018-09-29 DIAGNOSIS — R000 Tachycardia, unspecified: Secondary | ICD-10-CM | POA: Diagnosis not present

## 2018-09-29 DIAGNOSIS — I498 Other specified cardiac arrhythmias: Secondary | ICD-10-CM

## 2018-09-29 DIAGNOSIS — I951 Orthostatic hypotension: Secondary | ICD-10-CM

## 2018-09-29 DIAGNOSIS — G90A Postural orthostatic tachycardia syndrome (POTS): Secondary | ICD-10-CM

## 2018-09-29 NOTE — Progress Notes (Signed)
Electrophysiology TeleHealth Note   Due to national recommendations of social distancing due to COVID 19, an audio/video telehealth visit is felt to be most appropriate for this patient at this time.  See MyChart message from today for the patient's consent to telehealth for [facility].   Date:  09/29/2018   ID:  Teresa BorneMichelle Gorder, DOB 1980/11/14, MRN [id number]  Location: patient's home  Provider location: [address], [place]  Evaluation Performed: Follow-up visit  PCP:  Jomarie LongsBreeback, Jade L, PA-C  Cardiologist:   *  Electrophysiologist:  SK   Chief Complaint: Dysautonomia  History of Present Illness:    Teresa Garcia is a 38 y.o. female who presents via audio/video conferencing for a telehealth visit today.  Since last being seen in our clinic, the patient reports doing some what better  Life long history of Orthostatic and heat Intolerace, lower body aching complicated by recurrent depression   Symptoms worse around her periods which are increasingly heavy and frequent  Improved fluid status.  The diminished stress and physical efforts deriving from working at home having   Sleep study>> idiopathic hypersomnolence  ECG>> BAE but echo (PNi) normal   The patient denies symptoms of fevers, chills, cough, or new SOB worrisome for COVID 19.   Past Medical History:  Diagnosis Date  . Chronic bilateral low back pain without sciatica 08/04/2017  . Cubital tunnel syndrome on right 05/02/2017  . Depression, recurrent ([facility]) 03/26/2017   04/08/2017 PHQ9 = 17, GAD7 = 18 05/29/2017 PHQ 9 = 2, GAD 7 = 4  . Mixed irritable bowel syndrome 05/29/2017  . Raynaud's disease without gangrene 08/04/2017  . RLS (restless legs syndrome) 08/04/2017    Past Surgical History:  Procedure Laterality Date  . TUBAL LIGATION      Current Outpatient Medications  Medication Sig Dispense Refill  . amphetamine-dextroamphetamine (ADDERALL XR) 20 MG 24 hr capsule Take 1 capsule (20  mg total) by mouth every morning. 30 capsule 0  . meloxicam (MOBIC) 15 MG tablet Take 15 mg by mouth daily as needed for pain.    . hydrOXYzine (ATARAX/VISTARIL) 10 MG tablet TAKE 1 TABLET BY MOUTH THREE TIMES A DAY AS NEEDED (Patient not taking: Reported on 09/29/2018) 90 tablet 1   No current facility-administered medications for this visit.     Allergies:   Cymbalta [duloxetine hcl]; Trintellix [vortioxetine]; and Wellbutrin [bupropion]   Social History:  The patient  reports that she has been smoking e-cigarettes. She has been smoking about 0.25 packs per day. She has never used smokeless tobacco. She reports current alcohol use. She reports that she does not use drugs.   Family History:  The patient's   family history includes Diabetes in her maternal grandmother and paternal grandmother.   ROS:  Please see the history of present illness.   All other systems are personally reviewed and negative.    Exam:    Vital Signs:  BP 110/65   Pulse 80   Ht 5\' 4"  (1.626 m)   Wt 132 lb (59.9 kg)   BMI 22.66 kg/m     Well appearing, alert and conversant, regular work of breathing,  good skin color Eyes- anicteric, neuro- grossly intact, skin- no apparent rash or lesions or cyanosis, mouth- oral mucosa is pink   Labs/Other Tests and Data Reviewed:    Recent Labs: 03/20/2018: Hemoglobin 12.6   Wt Readings from Last 3 Encounters:  09/29/18 132 lb (59.9 kg)  08/13/18 130 lb 6.4  oz (59.1 kg)  07/28/18 131 lb (59.4 kg)     Other studies personally reviewed: Additional studies/ records that were reviewed today     ASSESSMENT & PLAN:    Autonomic insufficiency but not meet criteria for       POTS  Fatigue  Joint hypermobility   Depression  Suggested menses suppression   Encourage using the "bonustime" from COVID to pursue aerobic conditioning  Has been diagnosed with idiopathic hypersomnolence,  Not aware of its assoc with dysautonomia so will have to look     COVID 19  screen The patient denies symptoms of COVID 19 at this time.  The importance of social distancing was discussed today.  Follow-up: 3-4 m    Current medicines are reviewed at length with the patient today.   The patient does not have concerns regarding her medicines.  The following changes were made today:  none  Labs/ tests ordered today include:   No orders of the defined types were placed in this encounter.      Patient Risk:  after full review of this patients clinical status, I feel that they are at moderate risk at this time.  Today, I have spent 27  minutes with the patient with telehealth technology discussing the above.  Signed, Sherryl Manges, MD  09/29/2018 4:42 PM     [facility] [address] [place] [address] [phone] (office) [phone] (fax)

## 2018-10-02 ENCOUNTER — Telehealth: Payer: No Typology Code available for payment source | Admitting: Internal Medicine

## 2018-10-19 ENCOUNTER — Other Ambulatory Visit: Payer: Self-pay | Admitting: Physician Assistant

## 2018-10-19 NOTE — Telephone Encounter (Signed)
Need virtual visit. Can we get her on tomorrow?

## 2018-10-19 NOTE — Telephone Encounter (Signed)
Virtual visit scheduled for tomorrow.  

## 2018-10-20 ENCOUNTER — Ambulatory Visit (INDEPENDENT_AMBULATORY_CARE_PROVIDER_SITE_OTHER): Payer: No Typology Code available for payment source | Admitting: Physician Assistant

## 2018-10-20 ENCOUNTER — Encounter: Payer: Self-pay | Admitting: Physician Assistant

## 2018-10-20 VITALS — Ht 66.0 in | Wt 132.0 lb

## 2018-10-20 DIAGNOSIS — G4711 Idiopathic hypersomnia with long sleep time: Secondary | ICD-10-CM | POA: Diagnosis not present

## 2018-10-20 MED ORDER — AMPHETAMINE-DEXTROAMPHET ER 30 MG PO CP24: 30.0000 mg | ORAL_CAPSULE | ORAL | 0 refills | Status: DC

## 2018-10-20 MED FILL — ADDERALL XR 30 MG CAP SA: 30 | 30 days supply | Qty: 30 | Fill #0

## 2018-10-20 NOTE — Progress Notes (Signed)
Patient ID: Teresa Garcia, female   DOB: 08-Jan-1981, 38 y.o.   MRN: [id number] .Marland KitchenVirtual Visit via Video Note  I connected with [name] on 10/20/18 at  3:00 PM EDT by a video enabled telemedicine application and verified that I am speaking with the correct person using two identifiers.  Location: Patient: work Provider: clinic   I discussed the limitations of evaluation and management by telemedicine and the availability of in person appointments. The patient expressed understanding and agreed to proceed.  History of Present Illness: Pt is a 38 yo female with idiopathic hypersomnolence who calls in for refill of adderall. Pt has had extensive testing for her fatigue. She has seen neurologist who found no known reason for her to be tired but sleep study showed somolence. She has ordered HLA testing for narcolepsy to be done in the future. Pt is doing good on adderall 49m XR but wishes it would last longer. She gets to mid afternoon and crashes. No increase in anxiety. No insomnia. No palpitations.   .. Active Ambulatory Problems    Diagnosis Date Noted  . Myalgia and myositis 02/20/2016  . Decreased iron stores 02/21/2016  . Cervical paraspinal muscle spasm 03/29/2016  . Abnormal weight gain 06/06/2016  . Tonsil stone 08/05/2016  . Ecchymosis 01/20/2017  . Depression, recurrent ([place] 03/26/2017  . Cubital tunnel syndrome on right 05/02/2017  . Mixed irritable bowel syndrome 05/29/2017  . Dysphagia 08/04/2017  . Chronic bilateral low back pain without sciatica 08/04/2017  . RLS (restless legs syndrome) 08/04/2017  . Paresthesia 08/04/2017  . Raynaud's disease without gangrene 08/04/2017  . Chronic fatigue syndrome 11/06/2017  . Non-restorative sleep 11/07/2017  . Hypersomnolence 11/07/2017  . Carpal tunnel syndrome 12/31/2017  . De Quervain's tenosynovitis, right 12/31/2017  . Menorrhagia 03/13/2018  . Papanicolaou smear of cervix with low risk human papillomavirus (HPV) DNA  test positive 04/15/2018  . Tibialis posterior tendinitis, right 04/21/2018  . Fibromyalgia syndrome 04/24/2018   Resolved Ambulatory Problems    Diagnosis Date Noted  . Bilateral leg pain 02/20/2016  . No energy 02/20/2016  . Right knee pain 02/20/2016  . Inattention 07/15/2016  . Anxiety 09/25/2016  . Forgetfulness 09/25/2016  . Mood changes 03/27/2017  . Fullness of neck 08/04/2017  . Numbness and tingling of both lower extremities 08/15/2017   No Additional Past Medical History       Observations/Objective: No acute distress Normal appearance.  Normal mood.   .. Today's Vitals   10/20/18 1341  Weight: 132 lb (59.9 kg)  Height: 5' 6"  (1.676 m)   Body mass index is 21.31 kg/m.  Assessment and Plan: .Marland KitchenMarland KitchenCarliciawas seen today for adhd.  Diagnoses and all orders for this visit:  Idiopathic hypersomnolence -     amphetamine-dextroamphetamine (ADDERALL XR) 30 MG 24 hr capsule; Take 1 capsule (30 mg total) by mouth every morning. -     amphetamine-dextroamphetamine (ADDERALL XR) 30 MG 24 hr capsule; Take 1 capsule (30 mg total) by mouth every morning. -     amphetamine-dextroamphetamine (ADDERALL XR) 30 MG 24 hr capsule; Take 1 capsule (30 mg total) by mouth every morning.  increased adderall to XR 362mrefilled for 3 months. Neurology may add provigil to adderall. She will follow up in 2 months. Discussed drug holidays to increase efficacy.   Follow up in office in 3 months.    Follow Up Instructions:    I discussed the assessment and treatment plan with the patient. The patient was provided an opportunity  to ask questions and all were answered. The patient agreed with the plan and demonstrated an understanding of the instructions.   The patient was advised to call back or seek an in-person evaluation if the symptoms worsen or if the condition fails to improve as anticipated.  I provided 15 minutes of non-face-to-face time during this encounter.   Iran Planas, PA-C

## 2018-10-20 NOTE — Progress Notes (Deleted)
Patient doing well. Wants to discuss increasing Adderall, so I did not pend the refill. Very rarely taking hydroxyzine.

## 2018-10-25 IMAGING — MR MR HEAD WO/W CM
11 series · 47 of 48 positions shown · IV contrast (multihance)
Comparison: None.

CLINICAL DATA: Numbness and tingling in the bilateral upper
extremity from waist down. Pharyngo esophageal dysphagia.

EXAM:
MRI HEAD WITHOUT AND WITH CONTRAST
TECHNIQUE: Multiplanar, multiecho pulse sequences of the brain and surrounding
structures were obtained without and with intravenous contrast.
CONTRAST:  10mL MULTIHANCE GADOBENATE DIMEGLUMINE 529 MG/ML IV SOLN

[Series 2: T1 · sagittal · 5.0mm · 0.45mm/px · 2 of 23 slices shown (1 of 2)]
[im 1/23]
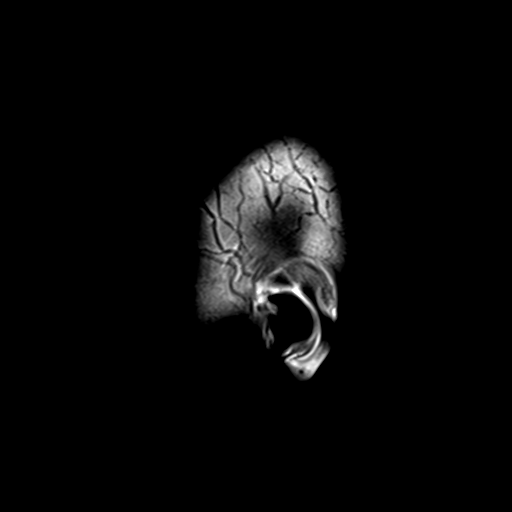
[im 23/23]
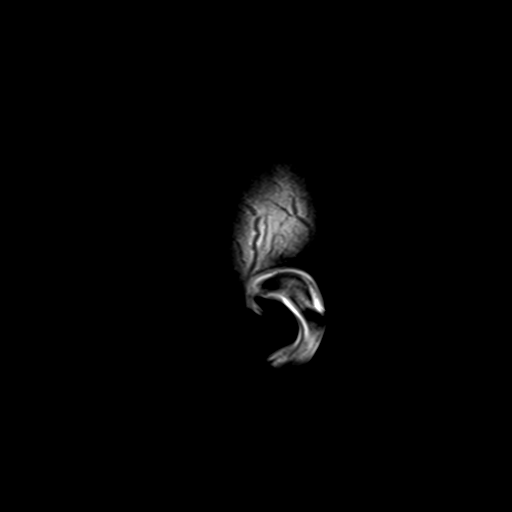

[Series 4: DWI · axial · 3.0mm · 1.20mm/px · z∈[-62,+97]mm · 4 of 55 slices shown (1 of 2)]
[im 1/55]
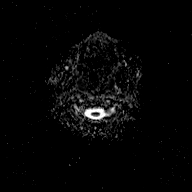
[im 19/55]
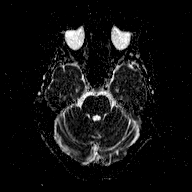
[im 37/55]
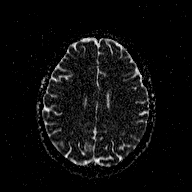
[im 55/55]
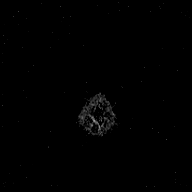

[Series 5: T2 · axial · 5.0mm · 0.72mm/px · z∈[-62,+96]mm · 2 of 24 slices shown (1 of 2)]
[im 1/24]
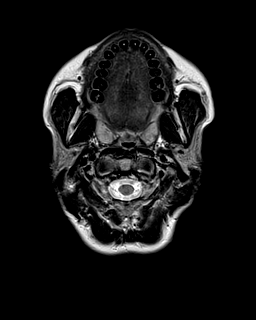
[im 24/24]
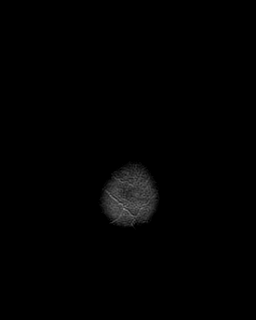

[Series 6: FLAIR · axial · 3.0mm · 0.45mm/px · z∈[-62,+97]mm · 4 of 55 slices shown (1 of 2)]
[im 1/55]
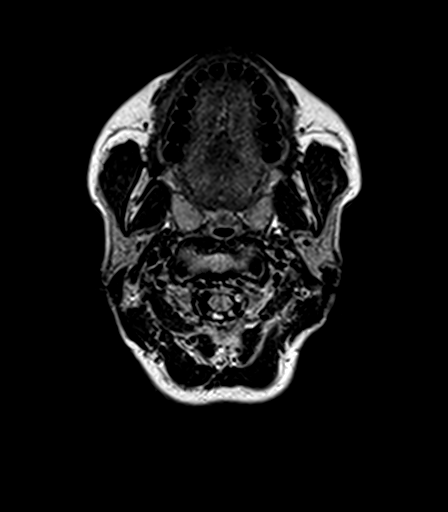
[im 19/55]
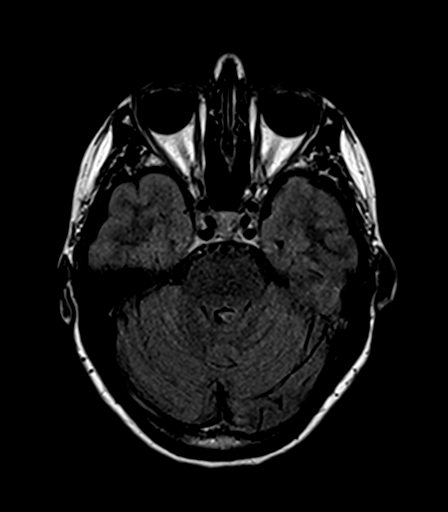
[im 37/55]
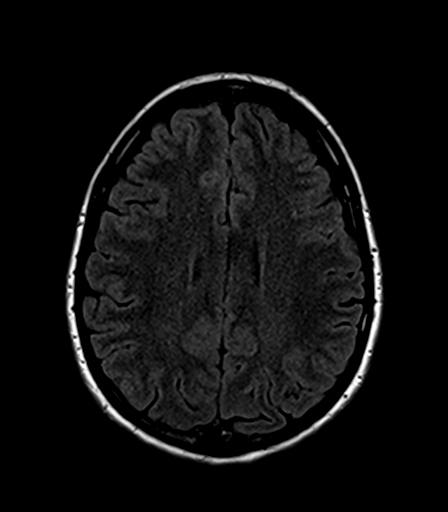
[im 55/55]
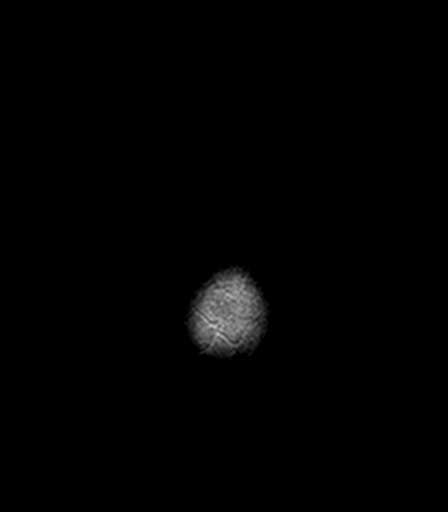

[Series 7: T2 · axial · 5.0mm · 0.72mm/px · z∈[-62,+96]mm · 2 of 24 slices shown (2 of 2)]
[im 1/24]
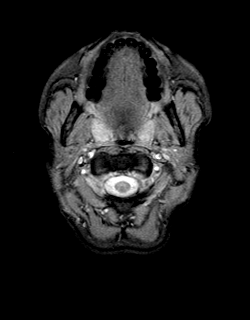
[im 24/24]
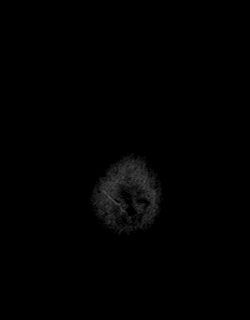

[Series 8: T1 · axial · 1.0mm · 1.00mm/px · z∈[-63,+92]mm · 11 of 160 slices shown (2 of 2)]
[im 1/160]
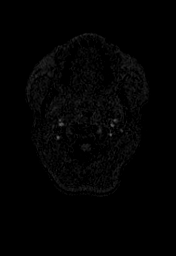
[im 15/160]
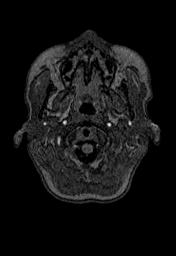
[im 29/160]
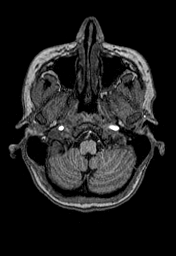
[im 44/160]
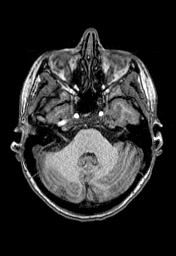
[im 58/160]
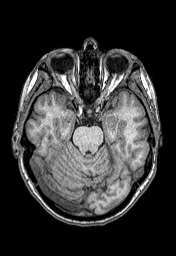
[im 73/160]
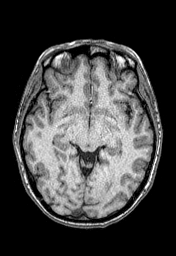
[im 87/160]
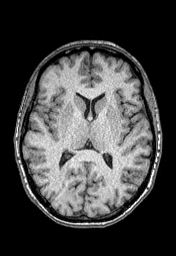
[im 102/160]
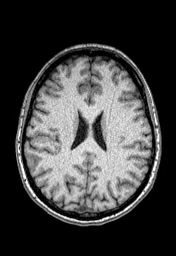
[im 116/160]
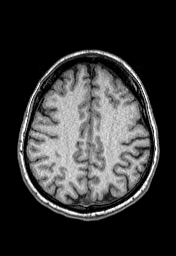
[im 131/160]
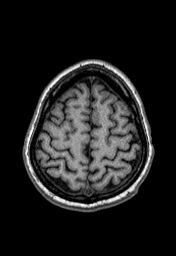
[im 160/160]
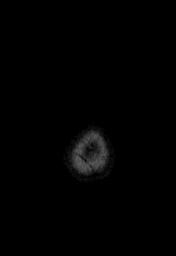

[Series 9: FLAIR · sagittal · 5.0mm · 0.45mm/px · 2 of 23 slices shown (2 of 2)]
[im 1/23]
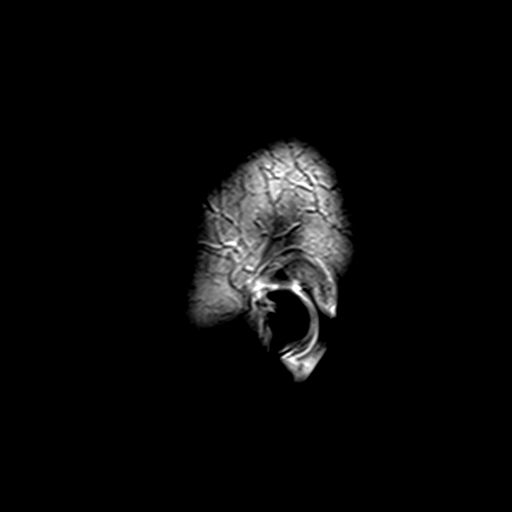
[im 23/23]
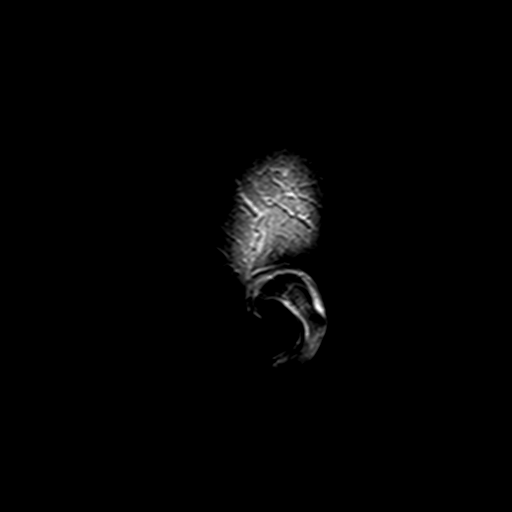

[Series 10: T2 post-contrast · coronal · 5.0mm · 0.45mm/px · 2 of 29 slices shown]
[im 1/29]
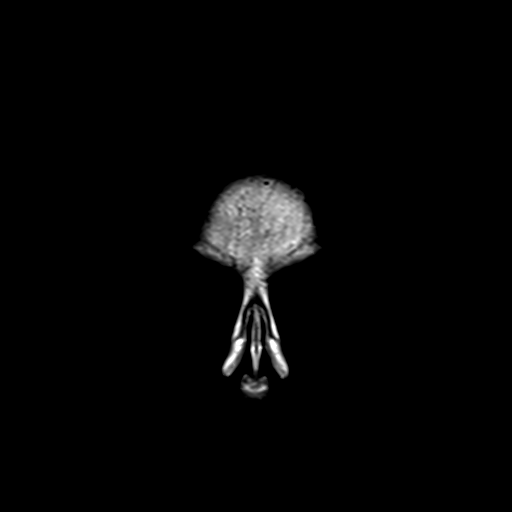
[im 29/29]
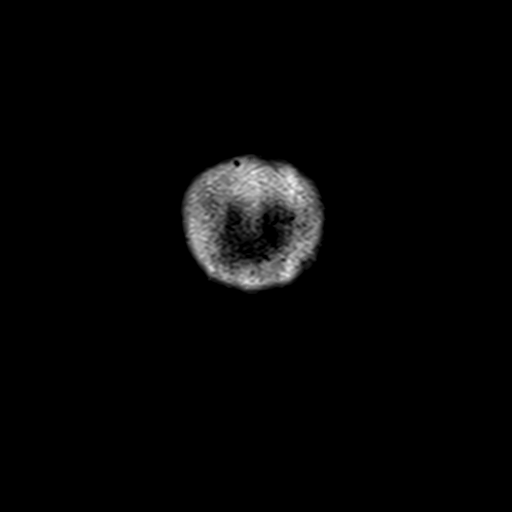

[Series 11: T1 post-contrast · axial · 1.0mm · 1.00mm/px · z∈[-63,+92]mm · 12 of 160 slices shown (1 of 2)]
[im 1/160]
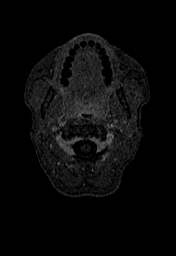
[im 15/160]
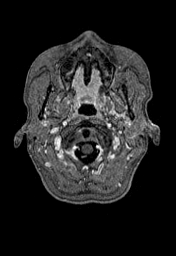
[im 29/160]
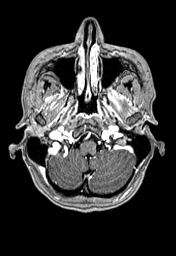
[im 44/160]
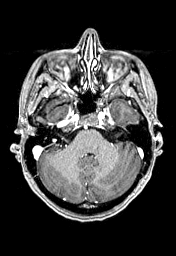
[im 58/160]
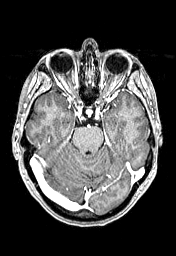
[im 73/160]
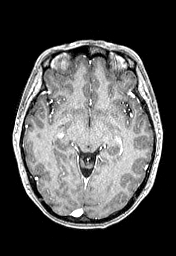
[im 87/160]
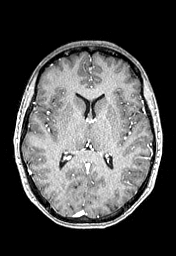
[im 102/160]
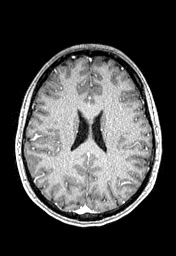
[im 116/160]
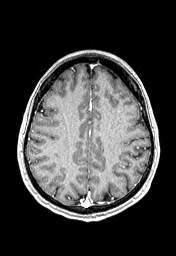
[im 131/160]
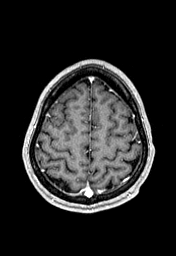
[im 145/160]
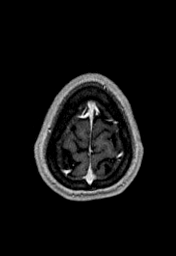
[im 160/160]
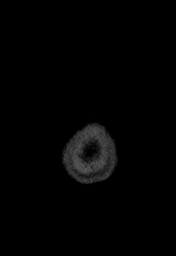

[Series 12: T1 post-contrast · coronal · 5.0mm · 0.45mm/px · 2 of 29 slices shown (2 of 2)]
[im 1/29]
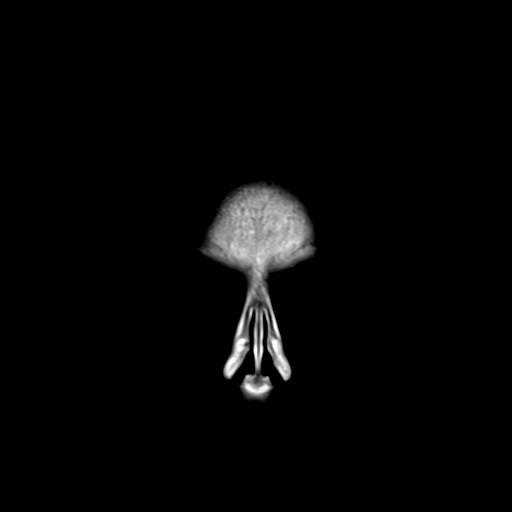
[im 29/29]
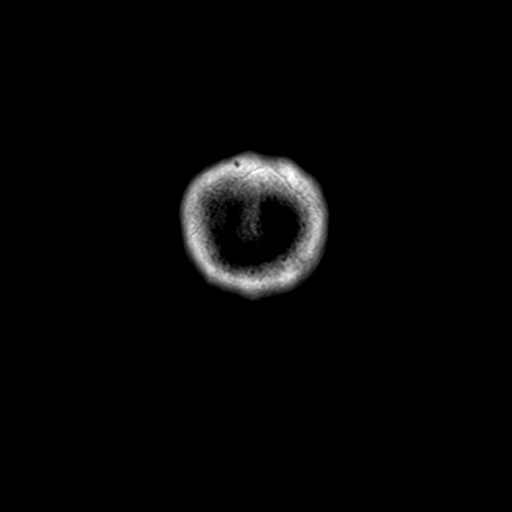

[Series 100: DWI · axial · 3.0mm · 1.20mm/px · z∈[-62,+97]mm · 4 of 55 slices shown (2 of 2)]
[im 1/55]
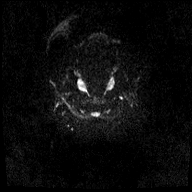
[im 19/55]
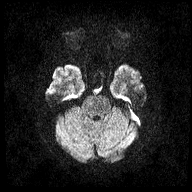
[im 37/55]
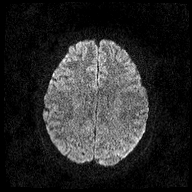
[im 55/55]
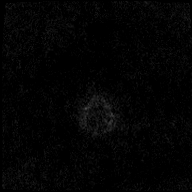

[47 of 48 positions shown; findings below may reference images not displayed]

FINDINGS: Brain: No explanation for symptoms. No white matter disease or
atrophy. No abnormality seen along the brainstem or skull base.

There is upward convexity of the anterior pituitary gland, but
normal dimensions of 7 mm craniocaudal on sagittal acquisition. No
differential enhancement or signal (accounting for neurohypophysis)
for a discrete and measurable mass. No regional mass effect. ACR
recommendations would be for correlation of history of pituitary
hyper secretion.

No infarct, hemorrhage, hydrocephalus, or collection.

Vascular: Major flow voids and vascular enhancements are preserved.

Skull and upper cervical spine: Negative for marrow lesion

Sinuses/Orbits: Negative
IMPRESSION: 1. No explanation for symptoms.  No findings of demyelination.
2. Full appearance of the pituitary gland without discrete mass.
Please correlate for history of pituitary hypersecretion.

## 2018-11-23 MED FILL — ADDERALL XR 30 MG CAP SA: 30 | 30 days supply | Qty: 30 | Fill #0

## 2018-12-02 ENCOUNTER — Other Ambulatory Visit: Payer: Self-pay | Admitting: Neurology

## 2018-12-02 DIAGNOSIS — G4711 Idiopathic hypersomnia with long sleep time: Secondary | ICD-10-CM

## 2018-12-02 MED ORDER — AMPHETAMINE-DEXTROAMPHET ER 30 MG PO CP24: 30.0000 mg | ORAL_CAPSULE | ORAL | 0 refills | Status: DC

## 2018-12-02 MED FILL — ADDERALL XR 30 MG CAP SA: 30 | 30 days supply | Qty: 30 | Fill #0

## 2018-12-02 NOTE — Telephone Encounter (Signed)
Aaron Edelman with [facility] [phone]) left vm stating they had shipped patient's Adderall XR 30 mg but it has been lost by [employer]. They need new RX sent. They have instructed [employer] if this is found, it is to be mailed back to [facility].    Please advise.

## 2018-12-04 ENCOUNTER — Encounter: Payer: Self-pay | Admitting: Sports Medicine

## 2018-12-04 ENCOUNTER — Ambulatory Visit (INDEPENDENT_AMBULATORY_CARE_PROVIDER_SITE_OTHER): Payer: No Typology Code available for payment source | Admitting: Sports Medicine

## 2018-12-04 DIAGNOSIS — M62838 Other muscle spasm: Secondary | ICD-10-CM

## 2018-12-04 MED ORDER — PREDNISONE 50 MG PO TABS: ORAL_TABLET | ORAL | 0 refills | Status: DC

## 2018-12-04 MED ORDER — MELOXICAM 15 MG PO TABS: ORAL_TABLET | ORAL | 3 refills | Status: DC

## 2018-12-04 MED ORDER — ORPHENADRINE CITRATE ER 100 MG PO TB12: 100.0000 mg | ORAL_TABLET | Freq: Two times a day (BID) | ORAL | 3 refills | Status: DC

## 2018-12-04 NOTE — Progress Notes (Signed)
Virtual Visit via WebEx/MyChart   I connected with  Teresa Garcia  on 12/04/18 via WebEx/MyChart/Doximity Video and verified that I am speaking with the correct person using two identifiers.   I discussed the limitations, risks, security and privacy concerns of performing an evaluation and management service by WebEx/MyChart/Doximity Video, including the higher likelihood of inaccurate diagnosis and treatment, and the availability of in person appointments.  We also discussed the likely need of an additional face to face encounter for complete and high quality delivery of care.  I also discussed with the patient that there may be a patient responsible charge related to this service. The patient expressed understanding and wishes to proceed.  Provider location is either at home or medical facility. Patient location is at their home, different from provider location. People involved in care of the patient during this telehealth encounter were myself, my nurse/medical assistant, and my front office/scheduling team member.  Subjective:    CC: Neck pain  HPI: Teresa Garcia is a pleasant 38 year old female, she is working from home, spending a lot of time looking down at a screen, she has developed pain in her neck, moderate, persistent, localized without radiation.  We treated her for paracervical spasm about 3 years ago and she responded well to conservative measures.  Nothing radicular, no progressive weakness, no constitutional symptoms.  I reviewed the past medical history, family history, social history, surgical history, and allergies today and no changes were needed.  Please see the problem list section below in epic for further details.  Past Medical History: Past Medical History:  Diagnosis Date  . Chronic bilateral low back pain without sciatica 08/04/2017  . Cubital tunnel syndrome on right 05/02/2017  . Depression, recurrent (HCC) 03/26/2017   04/08/2017 PHQ9 = 17, GAD7 = 18 05/29/2017  PHQ 9 = 2, GAD 7 = 4  . Mixed irritable bowel syndrome 05/29/2017  . Raynaud's disease without gangrene 08/04/2017  . RLS (restless legs syndrome) 08/04/2017   Past Surgical History: Past Surgical History:  Procedure Laterality Date  . TUBAL LIGATION     Social History: Social History   Socioeconomic History  . Marital status: Significant Other    Spouse name: Not on file  . Number of children: Not on file  . Years of education: Not on file  . Highest education level: Not on file  Occupational History  . Not on file  Social Needs  . Financial resource strain: Not on file  . Food insecurity    Worry: Not on file    Inability: Not on file  . Transportation needs    Medical: Not on file    Non-medical: Not on file  Tobacco Use  . Smoking status: Current Every Day Smoker    Packs/day: 0.25    Types: E-cigarettes  . Smokeless tobacco: Never Used  Substance and Sexual Activity  . Alcohol use: Yes    Comment: Socially  . Drug use: No  . Sexual activity: Yes    Birth control/protection: Surgical  Lifestyle  . Physical activity    Days per week: Not on file    Minutes per session: Not on file  . Stress: Not on file  Relationships  . Social Musicianconnections    Talks on phone: Not on file    Gets together: Not on file    Attends religious service: Not on file    Active member of club or organization: Not on file    Attends meetings of clubs or organizations:  Not on file    Relationship status: Not on file  Other Topics Concern  . Not on file  Social History Narrative  . Not on file   Family History: Family History  Problem Relation Age of Onset  . Diabetes Paternal Grandmother   . Diabetes Maternal Grandmother    Allergies: Allergies  Allergen Reactions  . Cymbalta [Duloxetine Hcl]     Made exhaustion worse.   Rennis Harding [Vortioxetine]     Increasing IBS symptoms.   . Wellbutrin [Bupropion] Other (See Comments)    Worsening of symptoms   Medications: See med  rec.  Review of Systems: No fevers, chills, night sweats, weight loss, chest pain, or shortness of breath.   Objective:    General: Speaking full sentences, no audible heavy breathing.  Sounds alert and appropriately interactive.  Appears well.  Face symmetric.  Extraocular movements intact.  Pupils equal and round.  No nasal flaring or accessory muscle use visualized.  No other physical exam performed due to the non-physical nature of this visit.  Impression and Recommendations:    Cervical paraspinal muscle spasm Recurrence of cervical paraspinal spasm, neck x-ray back in 2017 was unremarkable. She did well with prednisone, Flexeril and meloxicam. Refilling meloxicam, prednisone. Switching to orphenadrine as this can be less drowsy than the Flexeril. I am going to send her a PDF with the rehab exercises on MyChart, we can revisit this in 1 month if needed.  I discussed the above assessment and treatment plan with the patient. The patient was provided an opportunity to ask questions and all were answered. The patient agreed with the plan and demonstrated an understanding of the instructions.   The patient was advised to call back or seek an in-person evaluation if the symptoms worsen or if the condition fails to improve as anticipated.   I provided 25 minutes of non-face-to-face time during this encounter, 15 minutes of additional time was needed to gather information, review chart, records, communicate/coordinate with staff remotely, troubleshooting the multiple errors that we get every time when trying to do video calls through the electronic medical record, WebEx, and Doximity, restart the encounter multiple times due to instability of the software, as well as complete documentation.   ___________________________________________ Gwen Her. Dianah Field, M.D., ABFM., CAQSM. Primary Care and Sports Medicine [facility]  Adjunct Professor of [employer] [facility] [employer]

## 2018-12-04 NOTE — Assessment & Plan Note (Signed)
Recurrence of cervical paraspinal spasm, neck x-ray back in 2017 was unremarkable. She did well with prednisone, Flexeril and meloxicam. Refilling meloxicam, prednisone. Switching to orphenadrine as this can be less drowsy than the Flexeril. I am going to send her a PDF with the rehab exercises on MyChart, we can revisit this in 1 month if needed.

## 2018-12-07 ENCOUNTER — Other Ambulatory Visit: Payer: Self-pay

## 2018-12-07 DIAGNOSIS — M62838 Other muscle spasm: Secondary | ICD-10-CM

## 2018-12-07 MED ORDER — ORPHENADRINE CITRATE ER 100 MG PO TB12: 100.0000 mg | ORAL_TABLET | Freq: Two times a day (BID) | ORAL | 3 refills | Status: DC

## 2018-12-07 MED FILL — ORPHENADRINE 100 MG TAB SA: 100 | 30 days supply | Qty: 60 | Fill #0

## 2018-12-21 ENCOUNTER — Encounter: Payer: Self-pay | Admitting: Neurology

## 2018-12-21 ENCOUNTER — Ambulatory Visit: Payer: No Typology Code available for payment source | Admitting: Neurology

## 2018-12-21 ENCOUNTER — Other Ambulatory Visit: Payer: Self-pay

## 2018-12-21 VITALS — BP 117/82 | HR 104 | Ht 64.0 in | Wt 133.0 lb

## 2018-12-21 DIAGNOSIS — G4711 Idiopathic hypersomnia with long sleep time: Secondary | ICD-10-CM | POA: Diagnosis not present

## 2018-12-21 MED ORDER — MODAFINIL 100 MG PO TABS: 100.0000 mg | ORAL_TABLET | Freq: Two times a day (BID) | ORAL | 5 refills | Status: DC | PRN

## 2018-12-21 MED FILL — MODAFINIL 100 MG TABLET: 100 | 30 days supply | Qty: 60 | Fill #0

## 2018-12-21 MED FILL — predniSONE 50 MG TABS: 50 | 5 days supply | Qty: 5 | Fill #0

## 2018-12-21 MED FILL — MELOXICAM 15 MG TABLET: 15 | 30 days supply | Qty: 30 | Fill #0

## 2018-12-21 NOTE — Patient Instructions (Signed)
As discussed, we will add Provigil to your medication regimen, 100 mg strength (generic name modafinil): 1 pill up to 2 times a day as needed. Avoid after 3 PM. You can continue to take your Adderall long-acting in the morning around 8 AM as you have, and take the First dose of Provigil at 10 AM or thereabouts and a second dose after lunch, between 1 and 2 PM if needed.  You can start with just 1 pill if you like.  Side effects include (but are not limited to): high blood pressure, headache, nervousness, palpitations, GI upset, tremor.

## 2018-12-21 NOTE — Progress Notes (Signed)
Subjective:    Patient ID: Teresa Garcia is a 38 y.o. female.  HPI     Interim history:  Teresa Garcia is a 38 year old right-handed woman with an underlying medical history of orthostasis and suspected POTS or dysautonomia, low back pain, irritable bowel syndrome, Raynaud's disease, and restless leg syndrome, who presents for follow-up consultation of her sleep disorder, in particular idiopathic hypersomnolence.  The patient is accompanied by her fianc today.  I last saw her in virtual visit on 09/16/2018 at which time we talked about her sleep study results.  She was on symptomatic treatment with Adderall long-acting 20 mg once daily and doing fairly well.  She was advised to continue with her medication regimen.  We talked about potential adjunct medications or alternatives for future use.   Today, 12/21/2018: Please see below for virtual visit encounter details.    She reports that the Adderall is not as effective as it used to be.  She takes 30 mg once daily in the morning long-acting, typically around 7:45 AM or 8 AM.  She has an appointment pending with her cardiologist for a history of dysautonomia.  She is able to tolerate the Adderall.  She had no changes to her medications or medical history otherwise.  She is sleepy in the late afternoon, her fianc says that if she were left to her own devices she would be asleep by 5:30 PM.  She is able to make it through the workday, continues to work from home at this time.  We talked about her sleep study results again today.  The patient's allergies, current medications, family history, past medical history, past social history, past surgical history and problem list were reviewed and updated as appropriate.    Previously (copied from previous notes for reference):    I first met her at the request of Dr. Georgina Snell, at which time she reported a several year history of daytime somnolence, dating back to even teenage years. I suggested we proceed with  extended sleep testing to rule out an underlying hypersomnolence disorder with narcolepsy within the possibilities. She had an interim baseline sleep study, followed by a next a nap study. I went over her test results with her in detail today. Her baseline sleep study from 08/31/2018 showed a sleep latency of 18 minutes, REM latency 71 minutes, sleep efficiency higher at 91.9% for a sleep study situation. She had minimal to mild sleep fragmentation, she had an increased percentage of slow-wave sleep and REM sleep was normal at 22.9%. She had no significant sleep disordered breathing with an AHI of 0.1 per hour, average oxygen saturation of 95%, nadir of 93%. She had no significant PLMS. She had a next a MSLT on 09/01/2018 with a mean sleep latency of 5.1 minutes for 5 and one sleep onset REM period noted during nap #1.     07/28/2018: (She) reports a several year history of excessive daytime somnolence, dating back to her teenage years. She does remember falling asleep in class at times. She has previously been labeled as depressed and treated for depression but feels like she is not depressed. She does have a history of anxiety. She is currently not taking the Vistaril. She has been labeled as chronic fatigue. She has been late to work a few months ago and this prompted her to seek more serious medical attention for her sleepiness. She was started on Adderall long-acting 20 mg once daily in May 2019. This was increased to 30 mg in September  2019. She has taken longer, up to 3 hours at a time. Therefore, she avoids taking a nap because she cannot take a short nap typically. She reports being able to recall dreams. She has some vivid dreams but no nightmares. She has been noted to have talk in her sleep. She denies any sleep paralysis but has had hypnagogic hallucinations or dream like sequences that she can see before drifting off to sleep. She may have had cataplexy type weakness in the context of feeling  severely tired. She feels like her legs could give out or she will fall. She has had presyncopal and syncopal spells especially in hot environments or with sustained standing. She had workup from a cardiac standpoint including echocardiogram in December 2019 and a recent Holter monitor in January 2020. I reviewed your office note from 05/29/2018. Her Epworth sleepiness score is 16 out of 24 today, fatigue score is 58 out of 63. She lives with her fianc and her 2 sons, ages 67 and 103, and 1 daughter, age 53. She has a 42 year old stepson. She works full-time in [employer]. She is currently on Adderall 30 mg daily. She quit smoking cigarettes about a year and a half ago. She uses nicotine vapor occasionally. She does not drink alcohol currently, has had none in over one year, caffeine daily, usually one serving, rarely up to 3 servings per day. She is in bed typically around 10 or 10:30, rise time is 6:15. They have 1 dog in the household, not upstairs in their bedroom typically. She has no family history of sleep apnea or narcolepsy. She was recently given a prescription for nadolol but has not started it. She occasionally takes meloxicam but has not taken the hydroxyzine.  Her Past Medical History Is Significant For: Past Medical History:  Diagnosis Date  . Chronic bilateral low back pain without sciatica 08/04/2017  . Cubital tunnel syndrome on right 05/02/2017  . Depression, recurrent ([place]) 03/26/2017   04/08/2017 PHQ9 = 17, GAD7 = 18 05/29/2017 PHQ 9 = 2, GAD 7 = 4  . Mixed irritable bowel syndrome 05/29/2017  . Raynaud's disease without gangrene 08/04/2017  . RLS (restless legs syndrome) 08/04/2017    Her Past Surgical History Is Significant For: Past Surgical History:  Procedure Laterality Date  . TUBAL LIGATION      Her Family History Is Significant For: Family History  Problem Relation Age of Onset  . Diabetes Paternal Grandmother   . Diabetes Maternal Grandmother     Her Social History Is  Significant For: Social History   Socioeconomic History  . Marital status: Significant Other    Spouse name: Not on file  . Number of children: Not on file  . Years of education: Not on file  . Highest education level: Not on file  Occupational History  . Not on file  Social Needs  . Financial resource strain: Not on file  . Food insecurity    Worry: Not on file    Inability: Not on file  . Transportation needs    Medical: Not on file    Non-medical: Not on file  Tobacco Use  . Smoking status: Current Every Day Smoker    Packs/day: 0.25    Types: E-cigarettes  . Smokeless tobacco: Never Used  Substance and Sexual Activity  . Alcohol use: Yes    Comment: Socially  . Drug use: No  . Sexual activity: Yes    Birth control/protection: Surgical  Lifestyle  . Physical activity  Days per week: Not on file    Minutes per session: Not on file  . Stress: Not on file  Relationships  . Social Herbalist on phone: Not on file    Gets together: Not on file    Attends religious service: Not on file    Active member of club or organization: Not on file    Attends meetings of clubs or organizations: Not on file    Relationship status: Not on file  Other Topics Concern  . Not on file  Social History Narrative  . Not on file    Her Allergies Are:  Allergies  Allergen Reactions  . Cymbalta [Duloxetine Hcl]     Made exhaustion worse.   Rennis Harding [Vortioxetine]     Increasing IBS symptoms.   . Wellbutrin [Bupropion] Other (See Comments)    Worsening of symptoms  :   Her Current Medications Are:  Outpatient Encounter Medications as of 12/21/2018  Medication Sig  . amphetamine-dextroamphetamine (ADDERALL XR) 30 MG 24 hr capsule Take 1 capsule (30 mg total) by mouth every morning.  Marland Kitchen amphetamine-dextroamphetamine (ADDERALL XR) 30 MG 24 hr capsule Take 1 capsule (30 mg total) by mouth every morning.  Marland Kitchen amphetamine-dextroamphetamine (ADDERALL XR) 30 MG 24 hr capsule  Take 1 capsule (30 mg total) by mouth every morning.  . meloxicam (MOBIC) 15 MG tablet One tab PO qAM with breakfast for 2 weeks, then daily prn pain.  . [DISCONTINUED] orphenadrine (NORFLEX) 100 MG tablet Take 1 tablet (100 mg total) by mouth 2 (two) times daily.  . [DISCONTINUED] predniSONE (DELTASONE) 50 MG tablet One tab PO daily for 5 days.   No facility-administered encounter medications on file as of 12/21/2018.   :  Review of Systems:  Out of a complete 14 point review of systems, all are reviewed and negative with the exception of these symptoms as listed below:  Review of Systems  Neurological:       Pt presents today to discuss her sleepiness. Pt says that the adderrall is helping a little bit.    Objective:  Neurological Exam  Physical Exam Physical Examination:   Vitals:   12/21/18 1354  BP: 117/82  Pulse: (!) 104    General Examination: The patient is a very pleasant 38 y.o. female in no acute distress. She appears well-developed and well-nourished and well groomed.   HEENT: Normocephalic, atraumatic, pupils are equal, round and reactive to light and accommodation. Extraocular tracking is good without limitation to gaze excursion or nystagmus noted. Normal smooth pursuit is noted. Hearing is grossly intact. Face is symmetric with normal facial animation and normal facial sensation. Speech is clear with no dysarthria noted. There is no hypophonia. There is no lip, neck/head, jaw or voice tremor. Neck with FROM. There are no carotid bruits on auscultation. Oropharynx exam reveals: no significant mouth dryness, good dental hygiene. Tongue protrudes centrally and palate elevates symmetrically.  Chest: Clear to auscultation without wheezing, rhonchi or crackles noted.  Heart: S1+S2+0, regular and normal without murmurs, rubs or gallops noted.   Abdomen: Soft, non-tender and non-distended with normal bowel sounds appreciated on auscultation.  Extremities: There is no  pitting edema in the distal lower extremities bilaterally.   Skin: Warm and dry without trophic changes noted. Slightly erythematous feet and distal legs.  Musculoskeletal: exam reveals no obvious joint deformities, tenderness or joint swelling or erythema.   Neurologically:  Mental status: The patient is awake, alert and oriented in all 4  spheres. Her immediate and remote memory, attention, language skills and fund of knowledge are appropriate. There is no evidence of aphasia, agnosia, apraxia or anomia. Speech is clear with normal prosody and enunciation. Thought process is linear. Mood is normal and affect is normal.  Cranial nerves II - XII are as described above under HEENT exam. In addition: shoulder shrug is normal with equal shoulder height noted. Motor exam: Normal bulk, strength and tone is noted. There is no drift, tremor or rebound. Romberg is negative. Reflexes are 2+ throughout. Fine motor skills and coordination: intact grossly.   Cerebellar testing: No dysmetria or intention tremor.  Sensory exam: intact to light touch in the upper and lower extremities.  Gait, station and balance: She stands easily. No veering to one side is noted. No leaning to one side is noted. Posture is age-appropriate and stance is narrow based. Gait shows normal stride length and normal pace. No problems turning are noted.   Assessment and Plan:  In summary,Teresa Garcia is a 38 year old right-handed woman with an underlying medical history of orthostasis and Suspected autonomic dysregulation, low back pain, IBS, Raynaud's and restless leg syndrome, who presents For follow-up consultation of her hypersomnolence disorder with history and polysomnographic findings in keeping with idiopathic hypersomnolence.  We talked about her nocturnal polysomnogram and next day nap study, called MSLT (mean sleep latency test) in detail in April 2020 and again today. She is on Adderall XR generic, currently at 30 mg once  daily. She is able to function in the first part of her day and during work hours but really not much after 5 PM.  She is very sleepy in the afternoons.  We talked about alternative treatment options including increasing her stimulant medication, switching to another wake promoting agent such as Sun[place], And adding another medication as an adjunct.  I suggested we add generic Provigil 100 mg strength up to twice daily to her regimen.  We talked about expectations, side effects and the medication at length today.  She is agreeable.  I provided a new prescription and written instructions.  She is advised to continue with her stimulant medication first thing in the morning and take her first dose of Provigil around 10 AM and her second dose after lunch around 1 or 2 PM, no later than 3 PM to avoid nighttime insomnia. I suggested a follow-up in 6 months, sooner if needed.  I answered all their questions today and the patient and her fianc were in agreement.

## 2019-01-18 ENCOUNTER — Other Ambulatory Visit: Payer: Self-pay | Admitting: Physician Assistant

## 2019-01-18 DIAGNOSIS — G4711 Idiopathic hypersomnia with long sleep time: Secondary | ICD-10-CM

## 2019-01-18 MED ORDER — AMPHETAMINE-DEXTROAMPHET ER 30 MG PO CP24: 30.0000 mg | ORAL_CAPSULE | ORAL | 0 refills | Status: DC

## 2019-01-18 MED FILL — ADDERALL XR 30 MG CAP SA: 30 | 30 days supply | Qty: 30 | Fill #0

## 2019-02-15 ENCOUNTER — Other Ambulatory Visit: Payer: Self-pay | Admitting: Physician Assistant

## 2019-02-15 DIAGNOSIS — G4711 Idiopathic hypersomnia with long sleep time: Secondary | ICD-10-CM

## 2019-02-15 MED ORDER — AMPHETAMINE-DEXTROAMPHET ER 30 MG PO CP24: 30.0000 mg | ORAL_CAPSULE | ORAL | 0 refills | Status: DC

## 2019-02-15 MED FILL — ADDERALL XR 30 MG CAP SA: 30 | 30 days supply | Qty: 30 | Fill #0

## 2019-03-19 ENCOUNTER — Other Ambulatory Visit: Payer: Self-pay | Admitting: Physician Assistant

## 2019-03-19 DIAGNOSIS — G4711 Idiopathic hypersomnia with long sleep time: Secondary | ICD-10-CM

## 2019-03-19 NOTE — Telephone Encounter (Signed)
Teresa Garcia OP pharmacy requesting med refill for amphetamine-dextro.

## 2019-03-21 MED ORDER — AMPHETAMINE-DEXTROAMPHET ER 30 MG PO CP24: 30.0000 mg | ORAL_CAPSULE | ORAL | 0 refills | Status: DC

## 2019-03-22 MED FILL — ADDERALL XR 30 MG CAP SA: 30 | 30 days supply | Qty: 30 | Fill #0

## 2019-03-29 ENCOUNTER — Ambulatory Visit (INDEPENDENT_AMBULATORY_CARE_PROVIDER_SITE_OTHER): Payer: No Typology Code available for payment source | Admitting: Physician Assistant

## 2019-03-29 DIAGNOSIS — Z23 Encounter for immunization: Secondary | ICD-10-CM | POA: Diagnosis not present

## 2019-04-25 ENCOUNTER — Other Ambulatory Visit: Payer: Self-pay | Admitting: Physician Assistant

## 2019-04-25 DIAGNOSIS — G4711 Idiopathic hypersomnia with long sleep time: Secondary | ICD-10-CM

## 2019-04-26 MED ORDER — AMPHETAMINE-DEXTROAMPHET ER 30 MG PO CP24: 30.0000 mg | ORAL_CAPSULE | ORAL | 0 refills | Status: DC

## 2019-04-26 MED FILL — ADDERALL XR 30 MG CAP SA: 30 | 30 days supply | Qty: 30 | Fill #0

## 2019-04-26 NOTE — Telephone Encounter (Signed)
Needs appt. Can do virtual.  

## 2019-04-26 NOTE — Telephone Encounter (Signed)
Left patient a voicemail with information below. Let patient know to call us back to schedule a virtual visit.  °

## 2019-04-27 ENCOUNTER — Ambulatory Visit (INDEPENDENT_AMBULATORY_CARE_PROVIDER_SITE_OTHER): Payer: No Typology Code available for payment source | Admitting: Physician Assistant

## 2019-04-27 ENCOUNTER — Encounter: Payer: Self-pay | Admitting: Physician Assistant

## 2019-04-27 VITALS — BP 101/74 | HR 102 | Ht 66.0 in | Wt 132.0 lb

## 2019-04-27 DIAGNOSIS — G4711 Idiopathic hypersomnia with long sleep time: Secondary | ICD-10-CM

## 2019-04-27 DIAGNOSIS — G901 Familial dysautonomia [Riley-Day]: Secondary | ICD-10-CM | POA: Insufficient documentation

## 2019-04-27 MED ORDER — AMPHETAMINE-DEXTROAMPHET ER 30 MG PO CP24: 30.0000 mg | ORAL_CAPSULE | ORAL | 0 refills | Status: DC

## 2019-04-27 MED FILL — MODAFINIL 100 MG TABLET: 100 | 30 days supply | Qty: 60 | Fill #0

## 2019-04-27 NOTE — Progress Notes (Signed)
Doing well. Needs refills Adderall.

## 2019-04-27 NOTE — Progress Notes (Signed)
Patient ID: Teresa Garcia, female   DOB: [birth year], 38 y.o.   MRN: [id number] .Marland KitchenVirtual Visit via Video Note  I connected with Teresa Garcia on 04/27/19 at  9:50 AM EST by a video enabled telemedicine application and verified that I am speaking with the correct person using two identifiers.  Location: Patient: work Provider: clinic   I discussed the limitations of evaluation and management by telemedicine and the availability of in person appointments. The patient expressed understanding and agreed to proceed.  History of Present Illness: Pt is a 38 yo female with idiopathic hypersomnolence who calls in today for medication refills. She is doing well. She is on provigil and adderall. She sees neurologist to help manage treatment plan. She has also been seen by cardiology due to tachycardia. Dx of dysautonomia. Cards is ok with staying on adderall. No CP, insomnia, or headaches.   She is loving her new job. She is doing well and working from home.  .. Active Ambulatory Problems    Diagnosis Date Noted  . Myalgia and myositis 02/20/2016  . Decreased iron stores 02/21/2016  . Cervical paraspinal muscle spasm 03/29/2016  . Abnormal weight gain 06/06/2016  . Tonsil stone 08/05/2016  . Ecchymosis 01/20/2017  . Depression, recurrent ([place]) 03/26/2017  . Cubital tunnel syndrome on right 05/02/2017  . Mixed irritable bowel syndrome 05/29/2017  . Dysphagia 08/04/2017  . Chronic bilateral low back pain without sciatica 08/04/2017  . RLS (restless legs syndrome) 08/04/2017  . Paresthesia 08/04/2017  . Raynaud's disease without gangrene 08/04/2017  . Chronic fatigue syndrome 11/06/2017  . Non-restorative sleep 11/07/2017  . Hypersomnolence 11/07/2017  . Carpal tunnel syndrome 12/31/2017  . De Quervain's tenosynovitis, right 12/31/2017  . Menorrhagia 03/13/2018  . Papanicolaou smear of cervix with low risk human papillomavirus (HPV) DNA test positive 04/15/2018  . Tibialis posterior  tendinitis, right 04/21/2018  . Fibromyalgia syndrome 04/24/2018  . Dysautonomia ([place]) 04/27/2019   Resolved Ambulatory Problems    Diagnosis Date Noted  . Bilateral leg pain 02/20/2016  . No energy 02/20/2016  . Right knee pain 02/20/2016  . Inattention 07/15/2016  . Anxiety 09/25/2016  . Forgetfulness 09/25/2016  . Mood changes 03/27/2017  . Fullness of neck 08/04/2017  . Numbness and tingling of both lower extremities 08/15/2017   No Additional Past Medical History    Reviewed med, allergy, problem list.     Observations/Objective: No acute distress. Normal mood and appearance.   .. Today's Vitals   04/27/19 0919  BP: 101/74  Pulse: (!) 102  Weight: 132 lb (59.9 kg)  Height: 5\' 6"  (1.676 m)   Body mass index is 21.31 kg/m.   Assessment and Plan: Marland KitchenMarland KitchenDiagnoses and all orders for this visit:  Idiopathic hypersomnolence -     amphetamine-dextroamphetamine (ADDERALL XR) 30 MG 24 hr capsule; Take 1 capsule (30 mg total) by mouth every morning. -     amphetamine-dextroamphetamine (ADDERALL XR) 30 MG 24 hr capsule; Take 1 capsule (30 mg total) by mouth every morning. -     amphetamine-dextroamphetamine (ADDERALL XR) 30 MG 24 hr capsule; Take 1 capsule (30 mg total) by mouth every morning.  Dysautonomia ([place])   Refilled adderall for 3 months. [place] for next 3 months as well. Follow up in 6 months.  No problems doing well.     Follow Up Instructions:    I discussed the assessment and treatment plan with the patient. The patient was provided an opportunity to ask questions and all were answered. The patient agreed  with the plan and demonstrated an understanding of the instructions.   The patient was advised to call back or seek an in-person evaluation if the symptoms worsen or if the condition fails to improve as anticipated.   Tandy Gaw, PA-C

## 2019-05-12 ENCOUNTER — Other Ambulatory Visit: Payer: Self-pay

## 2019-05-12 ENCOUNTER — Encounter: Payer: Self-pay | Admitting: Internal Medicine

## 2019-05-12 ENCOUNTER — Ambulatory Visit (INDEPENDENT_AMBULATORY_CARE_PROVIDER_SITE_OTHER): Payer: No Typology Code available for payment source | Admitting: Internal Medicine

## 2019-05-12 VITALS — BP 112/79 | HR 94 | Ht 66.0 in | Wt 135.0 lb

## 2019-05-12 DIAGNOSIS — G90A Postural orthostatic tachycardia syndrome (POTS): Secondary | ICD-10-CM

## 2019-05-12 DIAGNOSIS — I498 Other specified cardiac arrhythmias: Secondary | ICD-10-CM

## 2019-05-12 NOTE — Progress Notes (Signed)
      Patient Care Team: Lavada Mesi as PCP - General (Family Medicine)   HPI  Teresa Garcia is a 38 y.o. female seen if followup for orthostatic intolerance in the context of idiopathic hypersomnolence and recurrent depression  She is somewhat improved but still lacks the energy to keep up with her fiance after working and children   No interval syncope; continues with tachycardia and tachypalpitiations     Records and Results Reviewed  Past Medical History:  Diagnosis Date  . Chronic bilateral low back pain without sciatica 08/04/2017  . Cubital tunnel syndrome on right 05/02/2017  . Depression, recurrent ([place]) 03/26/2017   04/08/2017 PHQ9 = 17, GAD7 = 18 05/29/2017 PHQ 9 = 2, GAD 7 = 4  . Mixed irritable bowel syndrome 05/29/2017  . Raynaud's disease without gangrene 08/04/2017  . RLS (restless legs syndrome) 08/04/2017    Past Surgical History:  Procedure Laterality Date  . TUBAL LIGATION      Current Meds  Medication Sig  . [START ON 06/27/2019] amphetamine-dextroamphetamine (ADDERALL XR) 30 MG 24 hr capsule Take 1 capsule (30 mg total) by mouth every morning.  . meloxicam (MOBIC) 15 MG tablet One tab PO qAM with breakfast for 2 weeks, then daily prn pain.  . modafinil (PROVIGIL) 100 MG tablet Take 1 tablet (100 mg total) by mouth 2 (two) times daily as needed (take at 10 AM and second dose at 1-2 PM no later than 3 PM.).    Allergies  Allergen Reactions  . Cymbalta [Duloxetine Hcl]     Made exhaustion worse.   Rennis Harding [Vortioxetine]     Increasing IBS symptoms.   . Wellbutrin [Bupropion] Other (See Comments)    Worsening of symptoms      Review of Systems negative except from HPI and PMH  Physical Exam BP 112/79   Pulse 94   Ht 5\' 6"  (1.676 m)   Wt 135 lb (61.2 kg)   SpO2 99%   BMI 21.79 kg/m   Well developed and nourished in no acute distress HENT normal Neck supple with JVP-  flat  Clear Regular rate and rhythm, no murmurs or  gallops Abd-soft with active BS No Clubbing cyanosis edema Skin-warm and dry A & Oriented  Grossly normal sensory and motor function  ECG SR at 94     Assessment and  Plan  Autonomic insufficiency but not meet criteria for POTS  Fatigue  Joint hypermobility   Depression   Encourage exercise, horizontal if possible  Try low dose BB, have given her 3 Rx to try in random order  Consider menses supression; compressive wear-- abdominal binder and thigh sleeves   Push Hydration including with rehydration solutions Liqid IV, Pedialyte advanced care or Tri-oral

## 2019-05-12 NOTE — Patient Instructions (Addendum)
Medication Instructions:  You have been given 3 different prescriptions to try. You may take them in any order. Please allow at least 2 weeks on each medication before changing.  DO NOT take more than one of these prescriptions at a time. 1) Metoprolol Succinate 25 mg once daily 2) Atenolol 25 mg once daily 3) Bisoprolol 2.5 mg once daily  If you need a refill on your cardiac medications before your next appointment, please call your pharmacy.   Lab work: None Ordered  If you have labs (blood work) drawn today and your tests are completely normal, you will receive your results only by: Marland Kitchen MyChart Message (if you have MyChart) OR . A paper copy in the mail If you have any lab test that is abnormal or we need to change your treatment, we will call you to review the results.  Testing/Procedures: None ordered  Follow-Up: . Follow up with Dr. Caryl Comes for a VIRTUAL Visit on 06/24/19 at 3:45 PM  Any Other Special Instructions Will Be Listed Below (If Applicable).  We recommend the following:  - Compressive clothing (example spanx) and/or abdominal binder - Exercise is very important.  Recumbent exercise with the bike, rowing machine or swimming is preferred.  It is important to remember that dizziness can be worse following exercise.

## 2019-05-25 MED FILL — ATENOLOL 25 MG TABS: 25 | 30 days supply | Qty: 30 | Fill #0

## 2019-05-25 MED FILL — MODAFINIL 100 MG TABLET: 100 | 30 days supply | Qty: 60 | Fill #0

## 2019-05-25 MED FILL — ADDERALL XR 30 MG CAP SA: 30 | 30 days supply | Qty: 30 | Fill #0

## 2019-06-14 ENCOUNTER — Other Ambulatory Visit: Payer: Self-pay

## 2019-06-14 NOTE — Telephone Encounter (Signed)
ERROR

## 2019-06-24 ENCOUNTER — Ambulatory Visit: Payer: No Typology Code available for payment source | Admitting: Neurology

## 2019-06-24 ENCOUNTER — Encounter: Payer: Self-pay | Admitting: Neurology

## 2019-06-24 ENCOUNTER — Other Ambulatory Visit: Payer: Self-pay

## 2019-06-24 ENCOUNTER — Telehealth: Payer: No Typology Code available for payment source | Admitting: Internal Medicine

## 2019-06-24 VITALS — BP 119/80 | HR 98 | Temp 97.5°F | Ht 64.0 in | Wt 137.5 lb

## 2019-06-24 DIAGNOSIS — G4711 Idiopathic hypersomnia with long sleep time: Secondary | ICD-10-CM | POA: Diagnosis not present

## 2019-06-24 MED ORDER — MODAFINIL 100 MG PO TABS: 100.0000 mg | ORAL_TABLET | Freq: Two times a day (BID) | ORAL | 5 refills | Status: DC | PRN

## 2019-06-24 MED FILL — MODAFINIL 100 MG TABLET: 100 | 30 days supply | Qty: 60 | Fill #0

## 2019-06-24 NOTE — Patient Instructions (Signed)
I have renewed your prescription for generic Provigil 100 mg twice daily.  You can also take just half a pill up to twice daily if you think the whole pill is too much.  Please follow-up routinely with one of our nurse practitioners in 3 months. You can continue with the Adderall long-acting once daily which is prescribed by your primary care PA.

## 2019-06-24 NOTE — Progress Notes (Signed)
Subjective:    Patient ID: Teresa Garcia is a 39 y.o. female.  HPI     Interim history:   Ms. Kruczek is a 40 year old right-handed woman with an underlying medical history of orthostasis and dysautonomia, low back pain, irritable bowel syndrome, Raynaud's disease, and restless leg syndrome, who presents for follow-up consultation of her sleep disorder, in particular, idiopathic hypersomnolence.  The patient is unaccompanied today.  I last saw her on 12/21/2018, at which time she reported that her Adderall was not as effective as it used to be, she was taking it once daily in the morning, 30 mg strength long-acting.  She was advised to add Provigil.  We started with the 100 mg strength once daily to up to twice daily.   Today, 06/24/2019: She reports doing okay, she does not always take her Provigil.  She has noticed that it sometimes keeps her up at night even if she only took 1 pill.  She also has noted more vivid dreams on the nights where she took the Provigil.  She has not tried just half a pill yet.  She continues to take Adderall long-acting 30 mg once daily through her primary care PA.  She was given 3 different prescriptions for a beta-blocker to try for her POTS through cardiology but has only filled the atenolol but has not tried it quite yet.  She has somewhat reluctant secondary to side effect concerns including low blood pressure.  She is trying to find the right time to try to take 1 of these medications.  She has a virtual follow-up pending with cardiology.   The patient's allergies, current medications, family history, past medical history, past social history, past surgical history and problem list were reviewed and updated as appropriate.    Previously (copied from previous notes for reference):     I saw her in virtual visit on 09/16/2018 at which time we talked about her sleep study results.  She was on symptomatic treatment with Adderall long-acting 20 mg once daily and doing fairly  well.  She was advised to continue with her medication regimen.  We talked about potential adjunct medications or alternatives for future use.     I first met her at the request of Dr. Georgina Snell, at which time she reported a several year history of daytime somnolence, dating back to even teenage years. I suggested we proceed with extended sleep testing to rule out an underlying hypersomnolence disorder with narcolepsy within the possibilities. She had an interim baseline sleep study, followed by a next a nap study. I went over her test results with her in detail today. Her baseline sleep study from 08/31/2018 showed a sleep latency of 18 minutes, REM latency 71 minutes, sleep efficiency higher at 91.9% for a sleep study situation. She had minimal to mild sleep fragmentation, she had an increased percentage of slow-wave sleep and REM sleep was normal at 22.9%. She had no significant sleep disordered breathing with an AHI of 0.1 per hour, average oxygen saturation of 95%, nadir of 93%. She had no significant PLMS. She had a next a MSLT on 09/01/2018 with a mean sleep latency of 5.1 minutes for 5 and one sleep onset REM period noted during nap #1.     07/28/2018: (She) reports a several year history of excessive daytime somnolence, dating back to her teenage years. She does remember falling asleep in class at times. She has previously been labeled as depressed and treated for depression but feels like she  is not depressed. She does have a history of anxiety. She is currently not taking the Vistaril. She has been labeled as chronic fatigue. She has been late to work a few months ago and this prompted her to seek more serious medical attention for her sleepiness. She was started on Adderall long-acting 20 mg once daily in May 2019. This was increased to 30 mg in September 2019. She has taken longer, up to 3 hours at a time. Therefore, she avoids taking a nap because she cannot take a short nap typically. She reports  being able to recall dreams. She has some vivid dreams but no nightmares. She has been noted to have talk in her sleep. She denies any sleep paralysis but has had hypnagogic hallucinations or dream like sequences that she can see before drifting off to sleep. She may have had cataplexy type weakness in the context of feeling severely tired. She feels like her legs could give out or she will fall. She has had presyncopal and syncopal spells especially in hot environments or with sustained standing. She had workup from a cardiac standpoint including echocardiogram in December 2019 and a recent Holter monitor in January 2020. I reviewed your office note from 05/29/2018. Her Epworth sleepiness score is 16 out of 24 today, fatigue score is 58 out of 63. She lives with her fianc and her 2 sons, ages 46 and 40, and 1 daughter, age 72. She has a 36 year old stepson. She works full-time in [employer]. She is currently on Adderall 30 mg daily. She quit smoking cigarettes about a year and a half ago. She uses nicotine vapor occasionally. She does not drink alcohol currently, has had none in over one year, caffeine daily, usually one serving, rarely up to 3 servings per day. She is in bed typically around 10 or 10:30, rise time is 6:15. They have 1 dog in the household, not upstairs in their bedroom typically. She has no family history of sleep apnea or narcolepsy. She was recently given a prescription for nadolol but has not started it. She occasionally takes meloxicam but has not taken the hydroxyzine.  Her Past Medical History Is Significant For: Past Medical History:  Diagnosis Date  . Chronic bilateral low back pain without sciatica 08/04/2017  . Cubital tunnel syndrome on right 05/02/2017  . Depression, recurrent ([place]) 03/26/2017   04/08/2017 PHQ9 = 17, GAD7 = 18 05/29/2017 PHQ 9 = 2, GAD 7 = 4  . Mixed irritable bowel syndrome 05/29/2017  . Raynaud's disease without gangrene 08/04/2017  . RLS (restless legs syndrome)  08/04/2017    Her Past Surgical History Is Significant For: Past Surgical History:  Procedure Laterality Date  . TUBAL LIGATION      Her Family History Is Significant For: Family History  Problem Relation Age of Onset  . Diabetes Paternal Grandmother   . Diabetes Maternal Grandmother     Her Social History Is Significant For: Social History   Socioeconomic History  . Marital status: Significant Other    Spouse name: Not on file  . Number of children: Not on file  . Years of education: Not on file  . Highest education level: Not on file  Occupational History  . Not on file  Tobacco Use  . Smoking status: Never Smoker  . Smokeless tobacco: Never Used  Substance and Sexual Activity  . Alcohol use: Yes    Comment: Socially  . Drug use: No  . Sexual activity: Yes    Birth control/protection:  Surgical  Other Topics Concern  . Not on file  Social History Narrative  . Not on file   Social Determinants of Health   Financial Resource Strain:   . Difficulty of Paying Living Expenses: Not on file  Food Insecurity:   . Worried About Charity fundraiser in the Last Year: Not on file  . Ran Out of Food in the Last Year: Not on file  Transportation Needs:   . Lack of Transportation (Medical): Not on file  . Lack of Transportation (Non-Medical): Not on file  Physical Activity:   . Days of Exercise per Week: Not on file  . Minutes of Exercise per Session: Not on file  Stress:   . Feeling of Stress : Not on file  Social Connections:   . Frequency of Communication with Friends and Family: Not on file  . Frequency of Social Gatherings with Friends and Family: Not on file  . Attends Religious Services: Not on file  . Active Member of Clubs or Organizations: Not on file  . Attends Archivist Meetings: Not on file  . Marital Status: Not on file    Her Allergies Are:  Allergies  Allergen Reactions  . Cymbalta [Duloxetine Hcl]     Made exhaustion worse.   Rennis Harding [Vortioxetine]     Increasing IBS symptoms.   . Wellbutrin [Bupropion] Other (See Comments)    Worsening of symptoms  :   Her Current Medications Are:  Outpatient Encounter Medications as of 06/24/2019  Medication Sig  . [START ON 06/27/2019] amphetamine-dextroamphetamine (ADDERALL XR) 30 MG 24 hr capsule Take 1 capsule (30 mg total) by mouth every morning.  . meloxicam (MOBIC) 15 MG tablet One tab PO qAM with breakfast for 2 weeks, then daily prn pain.  . modafinil (PROVIGIL) 100 MG tablet Take 1 tablet (100 mg total) by mouth 2 (two) times daily as needed (take at 10 AM and second dose at 1-2 PM no later than 3 PM.).  . [DISCONTINUED] modafinil (PROVIGIL) 100 MG tablet Take 1 tablet (100 mg total) by mouth 2 (two) times daily as needed (take at 10 AM and second dose at 1-2 PM no later than 3 PM.).   No facility-administered encounter medications on file as of 06/24/2019.  :  Review of Systems:  Out of a complete 14 point review of systems, all are reviewed and negative with the exception of these symptoms as listed below: Review of Systems  Constitutional: Negative.   HENT: Negative.   Eyes: Negative.   Respiratory: Negative.   Cardiovascular: Negative.   Gastrointestinal: Negative.   Endocrine: Negative.   Genitourinary: Negative.   Musculoskeletal: Positive for back pain and neck pain.  Neurological: Positive for dizziness, light-headedness and numbness.  Psychiatric/Behavioral: Positive for decreased concentration.    Objective:  Neurological Exam  Physical Exam Physical Examination:   Vitals:   06/24/19 1458  Temp: (!) 97.5 F (36.4 C)   General Examination: The patient is a very pleasant 39 y.o. female in no acute distress. She appears well-developed and well-nourished and well groomed.   HEENT:Normocephalic, atraumatic, pupils are equal, round and reactive to light, extraocular tracking is good. Hearing is grossly intact. Face is symmetric with normal  facial animation and normal facial sensation. Speech is clear with no dysarthria noted. There is no hypophonia. There is no lip, neck/head, jaw or voice tremor. Neck with FROM.There are no carotid bruits on auscultation. Oropharynx exam reveals:no significant mouth dryness, gooddental hygiene. Tongue  protrudes centrally and palate elevates symmetrically.  Chest:Clear to auscultation without wheezing, rhonchi or crackles noted.  Heart:S1+S2+0, regular and normal without murmurs, rubs or gallops noted.   Abdomen:Soft, non-tender and non-distended with normal bowel sounds appreciated on auscultation.  Extremities:There isnopitting edema in the distal lower extremities bilaterally.   Skin: Warm and dry without trophic changes noted.   Musculoskeletal: exam reveals no obvious joint deformities, tenderness or joint swelling or erythema.   Neurologically:  Mental status: The patient is awake, alert and oriented in all 4 spheres.Herimmediate and remote memory, attention, language skills and fund of knowledge are appropriate. There is no evidence of aphasia, agnosia, apraxia or anomia. Speech is clear with normal prosody and enunciation. Thought process is linear. Mood is normaland affect is normal.  Cranial nerves II - XII are as described above under HEENT exam.  Motor exam: Normal bulk, strength and tone is noted. There is no drift, tremor or rebound. Romberg is negative. Fine motor skills and coordination: intact grossly.   Cerebellar testing: No dysmetria or intention tremor.  Sensory exam: intact to light touch in the upper and lower extremities.  Gait, station and balance:Shestands easily. No veering to one side is noted. No leaning to one side is noted. Posture is age-appropriate and stance is narrow based. Gait showsnormalstride length and normalpace. No problems turning are noted. Tandem gait normal.  Assessmentand Plan:  In summary,Ms. Guardiola is a 39 year old  right-handed woman with an underlying medical history of POTS, low back pain, IBS, Raynaud's and restless leg syndrome, whopresents for follow-up consultation of her hypersomnolence disorder with history and polysomnographic findings in keeping with idiopathic hypersomnolence.  We talked about hernocturnal polysomnogramand next day nap study, called MSLT (mean sleep latency test) in detail in April 2020. She is on AdderallXR generic, currently at 30 mg once daily, as Prescribed by her primary care PA, she started Provigil generic in July 2020.  I suggested she increase it to 100 mg twice daily.  She has had some vivid dreams from it and also insomnia when taking it.  She is encouraged to try it at a lower dose, half a pill up to twice daily.  She is agreeable.  I renewed her prescription today and suggested she follow-up routinely to see one of our nurse practitioners in 6 months, sooner if needed.  I answered all her questions today and she was in agreement.

## 2019-07-01 MED FILL — MODAFINIL 100 MG TABLET: 100 | 30 days supply | Qty: 60 | Fill #0

## 2019-07-01 MED FILL — ADDERALL XR 30 MG CAP SA: 30 | 30 days supply | Qty: 30 | Fill #0

## 2019-07-22 ENCOUNTER — Other Ambulatory Visit: Payer: Self-pay

## 2019-07-22 ENCOUNTER — Telehealth (INDEPENDENT_AMBULATORY_CARE_PROVIDER_SITE_OTHER): Payer: No Typology Code available for payment source | Admitting: Internal Medicine

## 2019-07-22 VITALS — BP 102/69 | HR 71 | Wt 136.0 lb

## 2019-07-22 DIAGNOSIS — G901 Familial dysautonomia [Riley-Day]: Secondary | ICD-10-CM | POA: Diagnosis not present

## 2019-07-22 NOTE — Progress Notes (Signed)
Electrophysiology TeleHealth Note   Due to national recommendations of social distancing due to COVID 19, an audio/video telehealth visit is felt to be most appropriate for this patient at this time.  See MyChart message from today for the patient's consent to telehealth for [facility].   Date:  07/22/2019   ID:  Teresa Garcia, DOB 01-24-1981, MRN [id number]  Location: patient's home  Provider location: [address] [place]  Evaluation Performed: Follow-up visit  PCP:  Donella Stade, PA-C  Cardiologist:     Electrophysiologist:  SK   Chief Complaint:  Dysautonomia and joint laxity  History of Present Illness:    Teresa Garcia is a 39 y.o. female who presents via audio/video conferencing for a telehealth visit today.  Since last being seen in our clinic for orthostatic intolerance in the context of idiopathic hypersomnolence and recurrent depression with recommendations to consider compressive wear, oral rehydration and BB trial the patient reports  Not having tried BB, concerned taht it would lower BP  However has been taking atenolol for a few days and tolerating it and has noticed that her HR is not racing as before  Mental conditioning stable  Not exercising    Betablocker Date Reason stopped  Metop succ     Bisoprolol    Atenolol 1/21     Has evaluated her children, one has joint laxity and another tachypalps    The patient denies symptoms of fevers, chills, cough, or new SOB worrisome for COVID 19.    Past Medical History:  Diagnosis Date  . Chronic bilateral low back pain without sciatica 08/04/2017  . Cubital tunnel syndrome on right 05/02/2017  . Depression, recurrent ([place]) 03/26/2017   04/08/2017 PHQ9 = 17, GAD7 = 18 05/29/2017 PHQ 9 = 2, GAD 7 = 4  . Mixed irritable bowel syndrome 05/29/2017  . Raynaud's disease without gangrene 08/04/2017  . RLS (restless legs syndrome) 08/04/2017    Past Surgical History:  Procedure Laterality  Date  . TUBAL LIGATION      Current Outpatient Medications  Medication Sig Dispense Refill  . amphetamine-dextroamphetamine (ADDERALL XR) 30 MG 24 hr capsule Take 1 capsule (30 mg total) by mouth every morning. 30 capsule 0  . atenolol (TENORMIN) 25 MG tablet Take 25 mg by mouth daily.    . meloxicam (MOBIC) 15 MG tablet One tab PO qAM with breakfast for 2 weeks, then daily prn pain. 30 tablet 3  . modafinil (PROVIGIL) 100 MG tablet Take 1 tablet (100 mg total) by mouth 2 (two) times daily as needed (take at 10 AM and second dose at 1-2 PM no later than 3 PM.). 60 tablet 5   No current facility-administered medications for this visit.    Allergies:   Cymbalta [duloxetine hcl], Trintellix [vortioxetine], and Wellbutrin [bupropion]   Social History:  The patient  reports that she has never smoked. She has never used smokeless tobacco. She reports current alcohol use. She reports that she does not use drugs.   Family History:  The patient's   family history includes Diabetes in her maternal grandmother and paternal grandmother.   ROS:  Please see the history of present illness.   All other systems are personally reviewed and negative.    Exam:    Vital Signs:  BP 102/69   Pulse 71   Wt 136 lb (61.7 kg)   BMI 23.34 kg/m     Well appearing, alert and conversant, regular work of breathing,  good skin color Eyes- anicteric, neuro- grossly intact, skin- no apparent rash or lesions or cyanosis, mouth- oral mucosa is pink   Labs/Other Tests and Data Reviewed:    Recent Labs: No results found for requested labs within last 8760 hours.   Wt Readings from Last 3 Encounters:  07/22/19 136 lb (61.7 kg)  06/24/19 137 lb 8 oz (62.4 kg)  05/12/19 135 lb (61.2 kg)     Other studies personally reviewed: Additional studies/ records that were reviewed today include:       ASSESSMENT & PLAN:    Autonomic insufficiency and orthostatic intolerance but not meet criteria for POTS /    Fatigue  Joint hypermobility  Depression   Doing much better   Has tried atenolol with improvement, will continue at 12.5 daily  Not using compression garments  Suggested she try and encouraged use with  exercise   Mental status good   COVID 19 screen The patient denies symptoms of COVID 19 at this time.  The importance of social distancing was discussed today.  Follow-up:  *72m    Current medicines are reviewed at length with the patient today.   The patient does not have concerns regarding her medicines.  The following changes were made today:  none  Labs/ tests ordered today include:   No orders of the defined types were placed in this encounter.   Future tests ( post COVID )     Patient Risk:  after full review of this patients clinical status, I feel that they are at moderate  risk at this time.  Today, I have spent 12 minutes with the patient with telehealth technology discussing the above.  Signed, Sherryl Manges, MD  07/22/2019 3:16 PM     [facility] [address] [place] [address] [phone] (office) [phone] (fax)

## 2019-08-02 MED FILL — ADDERALL XR 30 MG CAP SA: 30 | 30 days supply | Qty: 30 | Fill #0

## 2019-08-30 ENCOUNTER — Other Ambulatory Visit: Payer: Self-pay | Admitting: Internal Medicine

## 2019-08-30 ENCOUNTER — Other Ambulatory Visit: Payer: Self-pay | Admitting: Physician Assistant

## 2019-08-30 DIAGNOSIS — G4711 Idiopathic hypersomnia with long sleep time: Secondary | ICD-10-CM

## 2019-08-30 MED ORDER — AMPHETAMINE-DEXTROAMPHET ER 30 MG PO CP24: 30.0000 mg | ORAL_CAPSULE | ORAL | 0 refills | Status: DC

## 2019-08-30 MED ORDER — ATENOLOL 25 MG PO TABS: 25.0000 mg | ORAL_TABLET | Freq: Every day | ORAL | 2 refills | Status: DC

## 2019-08-30 MED FILL — MODAFINIL 100 MG TABLET: 100 | 30 days supply | Qty: 60 | Fill #1

## 2019-08-30 MED FILL — ATENOLOL 25 MG TABS: 25 | 90 days supply | Qty: 90 | Fill #0

## 2019-08-30 MED FILL — ADDERALL XR 30 MG CAP SA: 30 | 30 days supply | Qty: 30 | Fill #0

## 2019-09-10 ENCOUNTER — Telehealth (INDEPENDENT_AMBULATORY_CARE_PROVIDER_SITE_OTHER): Payer: No Typology Code available for payment source | Admitting: Physician Assistant

## 2019-09-10 VITALS — BP 98/69 | HR 87 | Ht 64.0 in | Wt 136.0 lb

## 2019-09-10 DIAGNOSIS — G9332 Myalgic encephalomyelitis/chronic fatigue syndrome: Secondary | ICD-10-CM

## 2019-09-10 DIAGNOSIS — R5382 Chronic fatigue, unspecified: Secondary | ICD-10-CM | POA: Diagnosis not present

## 2019-09-10 DIAGNOSIS — G901 Familial dysautonomia [Riley-Day]: Secondary | ICD-10-CM

## 2019-09-10 DIAGNOSIS — G4710 Hypersomnia, unspecified: Secondary | ICD-10-CM

## 2019-09-10 MED ORDER — AMPHETAMINE-DEXTROAMPHET ER 30 MG PO CP24: 30.0000 mg | ORAL_CAPSULE | ORAL | 0 refills | Status: DC

## 2019-09-10 MED ORDER — AMPHETAMINE-DEXTROAMPHET ER 30 MG PO CP24: 30.0000 mg | ORAL_CAPSULE | Freq: Every day | ORAL | 0 refills | Status: DC

## 2019-09-10 MED FILL — ADDERALL XR 30 MG CAP SA: 30 | 30 days supply | Qty: 30 | Fill #0

## 2019-09-10 MED FILL — ATENOLOL 25 MG TABS: 25 | 90 days supply | Qty: 90 | Fill #0

## 2019-09-10 MED FILL — MODAFINIL 100 MG TABLET: 100 | 30 days supply | Qty: 60 | Fill #1

## 2019-09-10 NOTE — Progress Notes (Signed)
Following up for Adderall RX.   Looks like last written 06/27/2019 #30 and RX available to fill 09/30/2019? States she needs refills.   No other issues.

## 2019-09-13 ENCOUNTER — Encounter: Payer: Self-pay | Admitting: Physician Assistant

## 2019-09-13 NOTE — Progress Notes (Signed)
Patient ID: Teresa Garcia, female   DOB: [birth year], 39 y.o.   MRN: [id number] .Marland KitchenVirtual Visit via Video Note  I connected with Teresa Garcia on 09/10/2019 at  9:30 AM EDT by a video enabled telemedicine application and verified that I am speaking with the correct person using two identifiers.  Location: Patient: home Provider: clinic   I discussed the limitations of evaluation and management by telemedicine and the availability of in person appointments. The patient expressed understanding and agreed to proceed.  History of Present Illness: Pt is a 39 yo female with chronic fatigue and hypersomnolence who calls in for adderall refills.   She continues to see neurology and cardiology. No recent changes. She is doing well. No problems with increase anxiety or problems sleeping. Work and life doing well.   .. Active Ambulatory Problems    Diagnosis Date Noted  . Myalgia and myositis 02/20/2016  . Decreased iron stores 02/21/2016  . Cervical paraspinal muscle spasm 03/29/2016  . Abnormal weight gain 06/06/2016  . Tonsil stone 08/05/2016  . Ecchymosis 01/20/2017  . Depression, recurrent ([place]) 03/26/2017  . Cubital tunnel syndrome on right 05/02/2017  . Mixed irritable bowel syndrome 05/29/2017  . Dysphagia 08/04/2017  . Chronic bilateral low back pain without sciatica 08/04/2017  . RLS (restless legs syndrome) 08/04/2017  . Paresthesia 08/04/2017  . Raynaud's disease without gangrene 08/04/2017  . Chronic fatigue syndrome 11/06/2017  . Non-restorative sleep 11/07/2017  . Hypersomnolence 11/07/2017  . Carpal tunnel syndrome 12/31/2017  . De Quervain's tenosynovitis, right 12/31/2017  . Menorrhagia 03/13/2018  . Papanicolaou smear of cervix with low risk human papillomavirus (HPV) DNA test positive 04/15/2018  . Tibialis posterior tendinitis, right 04/21/2018  . Fibromyalgia syndrome 04/24/2018  . Dysautonomia ([place]) 04/27/2019   Resolved Ambulatory Problems    Diagnosis Date  Noted  . Bilateral leg pain 02/20/2016  . No energy 02/20/2016  . Right knee pain 02/20/2016  . Inattention 07/15/2016  . Anxiety 09/25/2016  . Forgetfulness 09/25/2016  . Mood changes 03/27/2017  . Fullness of neck 08/04/2017  . Numbness and tingling of both lower extremities 08/15/2017   No Additional Past Medical History   Reviewed med, allergies, problem lis.t     Observations/Objective: No acute distress.  Normal mood and appearance.   .. Today's Vitals   09/10/19 0857  BP: 98/69  Pulse: 87  Weight: 136 lb (61.7 kg)  Height: 5\' 4"  (1.626 m)   Body mass index is 23.34 kg/m.   Assessment and Plan: Marland KitchenMarland KitchenDiagnoses and all orders for this visit:  Hypersomnolence -     amphetamine-dextroamphetamine (ADDERALL XR) 30 MG 24 hr capsule; Take 1 capsule (30 mg total) by mouth every morning. -     amphetamine-dextroamphetamine (ADDERALL XR) 30 MG 24 hr capsule; Take 1 capsule (30 mg total) by mouth daily. -     amphetamine-dextroamphetamine (ADDERALL XR) 30 MG 24 hr capsule; Take 1 capsule (30 mg total) by mouth daily.  Chronic fatigue syndrome -     amphetamine-dextroamphetamine (ADDERALL XR) 30 MG 24 hr capsule; Take 1 capsule (30 mg total) by mouth every morning. -     amphetamine-dextroamphetamine (ADDERALL XR) 30 MG 24 hr capsule; Take 1 capsule (30 mg total) by mouth daily. -     amphetamine-dextroamphetamine (ADDERALL XR) 30 MG 24 hr capsule; Take 1 capsule (30 mg total) by mouth daily.  Dysautonomia ([place])   Refilled for 3 months.    Follow Up Instructions:    I discussed the assessment and treatment  plan with the patient. The patient was provided an opportunity to ask questions and all were answered. The patient agreed with the plan and demonstrated an understanding of the instructions.   The patient was advised to call back or seek an in-person evaluation if the symptoms worsen or if the condition fails to improve as anticipated.  I provided 10 minutes of  non-face-to-face time during this encounter.   Tandy Gaw, PA-C

## 2019-10-11 MED FILL — ADDERALL XR 30 MG CAP SA: 30 | 30 days supply | Qty: 30 | Fill #0

## 2019-11-17 MED FILL — ADDERALL XR 30 MG CAP SA: 30 | 30 days supply | Qty: 30 | Fill #0

## 2019-12-15 ENCOUNTER — Encounter: Payer: No Typology Code available for payment source | Admitting: Physician Assistant

## 2019-12-17 ENCOUNTER — Other Ambulatory Visit: Payer: Self-pay

## 2019-12-17 ENCOUNTER — Ambulatory Visit (INDEPENDENT_AMBULATORY_CARE_PROVIDER_SITE_OTHER): Payer: No Typology Code available for payment source | Admitting: Physician Assistant

## 2019-12-17 VITALS — BP 125/76 | HR 122 | Ht 64.0 in | Wt 140.0 lb

## 2019-12-17 DIAGNOSIS — N926 Irregular menstruation, unspecified: Secondary | ICD-10-CM

## 2019-12-17 DIAGNOSIS — R5382 Chronic fatigue, unspecified: Secondary | ICD-10-CM

## 2019-12-17 DIAGNOSIS — Z23 Encounter for immunization: Secondary | ICD-10-CM | POA: Diagnosis not present

## 2019-12-17 DIAGNOSIS — Z0000 Encounter for general adult medical examination without abnormal findings: Secondary | ICD-10-CM

## 2019-12-17 DIAGNOSIS — G901 Familial dysautonomia [Riley-Day]: Secondary | ICD-10-CM

## 2019-12-17 DIAGNOSIS — G4710 Hypersomnia, unspecified: Secondary | ICD-10-CM | POA: Diagnosis not present

## 2019-12-17 DIAGNOSIS — G9332 Myalgic encephalomyelitis/chronic fatigue syndrome: Secondary | ICD-10-CM

## 2019-12-17 DIAGNOSIS — Z131 Encounter for screening for diabetes mellitus: Secondary | ICD-10-CM

## 2019-12-17 DIAGNOSIS — Z13220 Encounter for screening for lipoid disorders: Secondary | ICD-10-CM

## 2019-12-17 MED ORDER — AMPHETAMINE-DEXTROAMPHET ER 30 MG PO CP24: 30.0000 mg | ORAL_CAPSULE | ORAL | 0 refills | Status: DC

## 2019-12-17 MED ORDER — AMPHETAMINE-DEXTROAMPHET ER 30 MG PO CP24: 30.0000 mg | ORAL_CAPSULE | Freq: Every day | ORAL | 0 refills | Status: DC

## 2019-12-17 MED FILL — ADDERALL XR 30 MG CAP SA: 30 | 30 days supply | Qty: 30 | Fill #0

## 2019-12-17 NOTE — Progress Notes (Signed)
Subjective:     Teresa Garcia is a 39 y.o. female and is here for a comprehensive physical exam. The patient reports problems - continues to struggle with hypersomolence but seeing sleep medicine and on stimulant. stable at this time. she is having some irregular periods. her mom went through early menopause. she wants hormones checked. .  Social History   Socioeconomic History  . Marital status: Significant Other    Spouse name: Not on file  . Number of children: Not on file  . Years of education: Not on file  . Highest education level: Not on file  Occupational History  . Not on file  Tobacco Use  . Smoking status: Never Smoker  . Smokeless tobacco: Never Used  Vaping Use  . Vaping Use: Every day  . Devices: does daily  Substance and Sexual Activity  . Alcohol use: Yes    Comment: Socially  . Drug use: No  . Sexual activity: Yes    Birth control/protection: Surgical  Other Topics Concern  . Not on file  Social History Narrative  . Not on file   Social Determinants of Health   Financial Resource Strain:   . Difficulty of Paying Living Expenses:   Food Insecurity:   . Worried About Programme researcher, broadcasting/film/video in the Last Year:   . Barista in the Last Year:   Transportation Needs:   . Freight forwarder (Medical):   Marland Kitchen Lack of Transportation (Non-Medical):   Physical Activity:   . Days of Exercise per Week:   . Minutes of Exercise per Session:   Stress:   . Feeling of Stress :   Social Connections:   . Frequency of Communication with Friends and Family:   . Frequency of Social Gatherings with Friends and Family:   . Attends Religious Services:   . Active Member of Clubs or Organizations:   . Attends Banker Meetings:   Marland Kitchen Marital Status:   Intimate Partner Violence:   . Fear of Current or Ex-Partner:   . Emotionally Abused:   Marland Kitchen Physically Abused:   . Sexually Abused:    Health Maintenance  Topic Date Due  . INFLUENZA VACCINE  01/16/2020  .  PAP SMEAR-Modifier  03/13/2021  . TETANUS/TDAP  12/16/2029  . Hepatitis C Screening  Completed  . HIV Screening  Completed    The following portions of the patient's history were reviewed and updated as appropriate: allergies, current medications, past family history, past medical history, past social history, past surgical history and problem list.  Review of Systems A comprehensive review of systems was negative.   Objective:    BP 125/76   Pulse (!) 122   Ht 5\' 4"  (1.626 m)   Wt 140 lb (63.5 kg)   SpO2 100%   BMI 24.03 kg/m  General appearance: alert, cooperative and appears stated age Head: Normocephalic, without obvious abnormality, atraumatic Eyes: conjunctivae/corneas clear. PERRL, EOM's intact. Fundi benign. Ears: normal TM's and external ear canals both ears Nose: Nares normal. Septum midline. Mucosa normal. No drainage or sinus tenderness. Throat: lips, mucosa, and tongue normal; teeth and gums normal Neck: no adenopathy, no carotid bruit, no JVD, supple, symmetrical, trachea midline and thyroid not enlarged, symmetric, no tenderness/mass/nodules Back: symmetric, no curvature. ROM normal. No CVA tenderness. Lungs: clear to auscultation bilaterally Heart: regular rate and rhythm, S1, S2 normal, no murmur, click, rub or gallop Abdomen: soft, non-tender; bowel sounds normal; no masses,  no organomegaly Extremities: extremities  normal, atraumatic, no cyanosis or edema Pulses: 2+ and symmetric Skin: Skin color, texture, turgor normal. No rashes or lesions Lymph nodes: Cervical, supraclavicular, and axillary nodes normal. Neurologic: Alert and oriented X 3, normal strength and tone. Normal symmetric reflexes. Normal coordination and gait   .Marland Kitchen Depression screen [facility] 2/9 12/17/2019 03/26/2017 09/25/2016  Decreased Interest 0 2 0  Down, Depressed, Hopeless 0 2 0  PHQ - 2 Score 0 4 0  Altered sleeping 1 1 0  Tired, decreased energy 2 3 3   Change in appetite 0 1 0  Feeling bad  or failure about yourself  0 1 0  Trouble concentrating 0 3 1  Moving slowly or fidgety/restless 0 1 0  Suicidal thoughts 0 0 0  PHQ-9 Score 3 14 4   Difficult doing work/chores Not difficult at all - -    Assessment:    Healthy female exam.      Plan:     Shamyah was seen today for annual exam.  Diagnoses and all orders for this visit:  Routine physical examination -     Lipid Panel w/reflex Direct LDL -     COMPLETE METABOLIC PANEL WITH GFR -     CBC with Differential/Platelet -     Estradiol -     Progesterone -     FSH/LH -     Testosterone -     TSH  Need for Tdap vaccination -     Tdap vaccine greater than or equal to 7yo IM  Hypersomnolence -     amphetamine-dextroamphetamine (ADDERALL XR) 30 MG 24 hr capsule; Take 1 capsule (30 mg total) by mouth every morning. -     amphetamine-dextroamphetamine (ADDERALL XR) 30 MG 24 hr capsule; Take 1 capsule (30 mg total) by mouth daily. -     amphetamine-dextroamphetamine (ADDERALL XR) 30 MG 24 hr capsule; Take 1 capsule (30 mg total) by mouth daily. -     Ambulatory referral to Neurology  Chronic fatigue syndrome -     amphetamine-dextroamphetamine (ADDERALL XR) 30 MG 24 hr capsule; Take 1 capsule (30 mg total) by mouth every morning. -     amphetamine-dextroamphetamine (ADDERALL XR) 30 MG 24 hr capsule; Take 1 capsule (30 mg total) by mouth daily. -     amphetamine-dextroamphetamine (ADDERALL XR) 30 MG 24 hr capsule; Take 1 capsule (30 mg total) by mouth daily. -     Ambulatory referral to Neurology  Screening for lipid disorders -     Lipid Panel w/reflex Direct LDL  Screening for diabetes mellitus -     COMPLETE METABOLIC PANEL WITH GFR  Irregular periods -     Estradiol -     Progesterone -     FSH/LH -     Testosterone -     TSH   .Marland Kitchen Discussed 150 minutes of exercise a week.  Encouraged vitamin D 1000 units and Calcium 1300mg  or 4 servings of dairy a day.  Fasting labs ordered.  Pap UTD.  Vaccines UTd.    adderall refilled. Continue to see sleep medicine.   Continue to see cardiology for dysautonomia. Consider asking about midodrine to replace beta blocker.   Hormones added to evaluate irregular periods.  See After Visit Summary for Counseling Recommendations

## 2019-12-17 NOTE — Patient Instructions (Signed)
Midodrine for dysautonomy.     Health Maintenance, Female Adopting a healthy lifestyle and getting preventive care are important in promoting health and wellness. Ask your health care provider about:  The right schedule for you to have regular tests and exams.  Things you can do on your own to prevent diseases and keep yourself healthy. What should I know about diet, weight, and exercise? Eat a healthy diet   Eat a diet that includes plenty of vegetables, fruits, low-fat dairy products, and lean protein.  Do not eat a lot of foods that are high in solid fats, added sugars, or sodium. Maintain a healthy weight Body mass index (BMI) is used to identify weight problems. It estimates body fat based on height and weight. Your health care provider can help determine your BMI and help you achieve or maintain a healthy weight. Get regular exercise Get regular exercise. This is one of the most important things you can do for your health. Most adults should:  Exercise for at least 150 minutes each week. The exercise should increase your heart rate and make you sweat (moderate-intensity exercise).  Do strengthening exercises at least twice a week. This is in addition to the moderate-intensity exercise.  Spend less time sitting. Even light physical activity can be beneficial. Watch cholesterol and blood lipids Have your blood tested for lipids and cholesterol at 38 years of age, then have this test every 5 years. Have your cholesterol levels checked more often if:  Your lipid or cholesterol levels are high.  You are older than 38 years of age.  You are at high risk for heart disease. What should I know about cancer screening? Depending on your health history and family history, you may need to have cancer screening at various ages. This may include screening for:  Breast cancer.  Cervical cancer.  Colorectal cancer.  Skin cancer.  Lung cancer. What should I know about heart  disease, diabetes, and high blood pressure? Blood pressure and heart disease  High blood pressure causes heart disease and increases the risk of stroke. This is more likely to develop in people who have high blood pressure readings, are of African descent, or are overweight.  Have your blood pressure checked: ? Every 3-5 years if you are 8-21 years of age. ? Every year if you are 54 years old or older. Diabetes Have regular diabetes screenings. This checks your fasting blood sugar level. Have the screening done:  Once every three years after age 56 if you are at a normal weight and have a low risk for diabetes.  More often and at a younger age if you are overweight or have a high risk for diabetes. What should I know about preventing infection? Hepatitis B If you have a higher risk for hepatitis B, you should be screened for this virus. Talk with your health care provider to find out if you are at risk for hepatitis B infection. Hepatitis C Testing is recommended for:  Everyone born from [birth year] through 1964.  Anyone with known risk factors for hepatitis C. Sexually transmitted infections (STIs)  Get screened for STIs, including gonorrhea and chlamydia, if: ? You are sexually active and are younger than 38 years of age. ? You are older than 38 years of age and your health care provider tells you that you are at risk for this type of infection. ? Your sexual activity has changed since you were last screened, and you are at increased risk for chlamydia or  gonorrhea. Ask your health care provider if you are at risk.  Ask your health care provider about whether you are at high risk for HIV. Your health care provider may recommend a prescription medicine to help prevent HIV infection. If you choose to take medicine to prevent HIV, you should first get tested for HIV. You should then be tested every 3 months for as long as you are taking the medicine. Pregnancy  If you are about to stop  having your period (premenopausal) and you may become pregnant, seek counseling before you get pregnant.  Take 400 to 800 micrograms (mcg) of folic acid every day if you become pregnant.  Ask for birth control (contraception) if you want to prevent pregnancy. Osteoporosis and menopause Osteoporosis is a disease in which the bones lose minerals and strength with aging. This can result in bone fractures. If you are 9 years old or older, or if you are at risk for osteoporosis and fractures, ask your health care provider if you should:  Be screened for bone loss.  Take a calcium or vitamin D supplement to lower your risk of fractures.  Be given hormone replacement therapy (HRT) to treat symptoms of menopause. Follow these instructions at home: Lifestyle  Do not use any products that contain nicotine or tobacco, such as cigarettes, e-cigarettes, and chewing tobacco. If you need help quitting, ask your health care provider.  Do not use street drugs.  Do not share needles.  Ask your health care provider for help if you need support or information about quitting drugs. Alcohol use  Do not drink alcohol if: ? Your health care provider tells you not to drink. ? You are pregnant, may be pregnant, or are planning to become pregnant.  If you drink alcohol: ? Limit how much you use to 0-1 drink a day. ? Limit intake if you are breastfeeding.  Be aware of how much alcohol is in your drink. In the [place] one drink equals one 12 oz bottle of beer (355 mL), one 5 oz glass of wine (148 mL), or one 1 oz glass of hard liquor (44 mL). General instructions  Schedule regular health, dental, and eye exams.  Stay current with your vaccines.  Tell your health care provider if: ? You often feel depressed. ? You have ever been abused or do not feel safe at home. Summary  Adopting a healthy lifestyle and getting preventive care are important in promoting health and wellness.  Follow your health  care provider's instructions about healthy diet, exercising, and getting tested or screened for diseases.  Follow your health care provider's instructions on monitoring your cholesterol and blood pressure. This information is not intended to replace advice given to you by your health care provider. Make sure you discuss any questions you have with your health care provider. Document Revised: 05/27/2018 Document Reviewed: 05/27/2018 Elsevier Patient Education  2020 [employer].

## 2019-12-21 ENCOUNTER — Encounter: Payer: Self-pay | Admitting: Physician Assistant

## 2019-12-23 ENCOUNTER — Ambulatory Visit (INDEPENDENT_AMBULATORY_CARE_PROVIDER_SITE_OTHER): Payer: No Typology Code available for payment source | Admitting: Family Medicine

## 2019-12-23 ENCOUNTER — Encounter: Payer: Self-pay | Admitting: Family Medicine

## 2019-12-23 ENCOUNTER — Other Ambulatory Visit: Payer: Self-pay

## 2019-12-23 VITALS — BP 113/82 | HR 112 | Ht 64.0 in | Wt 144.2 lb

## 2019-12-23 DIAGNOSIS — G4711 Idiopathic hypersomnia with long sleep time: Secondary | ICD-10-CM

## 2019-12-23 NOTE — Patient Instructions (Signed)
We will continue Provigil 170m twice daily as needed. Continue Adderall per PCP recommendations   Stay well hydrated. Well balanced diet and regular exercise encouraged.   Follow up in 6 months   Fatigue If you have fatigue, you feel tired all the time and have a lack of energy or a lack of motivation. Fatigue may make it difficult to start or complete tasks because of exhaustion. In general, occasional or mild fatigue is often a normal response to activity or life. However, long-lasting (chronic) or extreme fatigue may be a symptom of a medical condition. Follow these instructions at home: General instructions  Watch your fatigue for any changes.  Go to bed and get up at the same time every day.  Avoid fatigue by pacing yourself during the day and getting enough sleep at night.  Maintain a healthy weight. Medicines  Take over-the-counter and prescription medicines only as told by your health care provider.  Take a multivitamin, if told by your health care provider.  Do not use herbal or dietary supplements unless they are approved by your health care provider. Activity   Exercise regularly, as told by your health care provider.  Use or practice techniques to help you relax, such as yoga, tai chi, meditation, or massage therapy. Eating and drinking   Avoid heavy meals in the evening.  Eat a well-balanced diet, which includes lean proteins, whole grains, plenty of fruits and vegetables, and low-fat dairy products.  Avoid consuming too much caffeine.  Avoid the use of alcohol.  Drink enough fluid to keep your urine pale yellow. Lifestyle  Change situations that cause you stress. Try to keep your work and personal schedule in balance.  Do not use any products that contain nicotine or tobacco, such as cigarettes and e-cigarettes. If you need help quitting, ask your health care provider.  Do not use drugs. Contact a health care provider if:  Your fatigue does not  get better.  You have a fever.  You suddenly lose or gain weight.  You have headaches.  You have trouble falling asleep or sleeping through the night.  You feel angry, guilty, anxious, or sad.  You are unable to have a bowel movement (constipation).  Your skin is dry.  You have swelling in your legs or another part of your body. Get help right away if:  You feel confused.  Your vision is blurry.  You feel faint or you pass out.  You have a severe headache.  You have severe pain in your abdomen, your back, or the area between your waist and hips (pelvis).  You have chest pain, shortness of breath, or an irregular or fast heartbeat.  You are unable to urinate, or you urinate less than normal.  You have abnormal bleeding, such as bleeding from the rectum, vagina, nose, lungs, or nipples.  You vomit blood.  You have thoughts about hurting yourself or others. If you ever feel like you may hurt yourself or others, or have thoughts about taking your own life, get help right away. You can go to your nearest emergency department or call:  Your local emergency services (911 in the [place]  A suicide crisis helpline, such as the [facility] 1308-800-0057 This is open 24 hours a day. Summary  If you have fatigue, you feel tired all the time and have a lack of energy or a lack of motivation.  Fatigue may make it difficult to start or complete tasks because of exhaustion.  Long-lasting (chronic) or extreme fatigue may be a symptom of a medical condition.  Exercise regularly, as told by your health care provider.  Change situations that cause you stress. Try to keep your work and personal schedule in balance. This information is not intended to replace advice given to you by your health care provider. Make sure you discuss any questions you have with your health care provider. Document Revised: 12/23/2018 Document Reviewed: 02/26/2017 Elsevier  Patient Education  2020 [employer].

## 2019-12-23 NOTE — Progress Notes (Addendum)
PATIENT: Teresa Garcia DOB: 1980/08/28  REASON FOR VISIT: follow up HISTORY FROM: patient  Chief Complaint  Patient presents with  . Follow-up    6 month f/u. States she has been stable since last visit. Medication is still working.   . room 1    alone     HISTORY OF PRESENT ILLNESS: Today 12/23/19 Teresa Garcia is a 39 y.o. female here today for follow up for idiopathic hypersomnolence. She continues Adderall 50m every day (prescribed by PCP). She uses Provigil 50-1075mBID as needed. Some days she doesn't take it at all. Recently, she has not needed to take it. She is now working from home and feels stress levels are much better. She has stayed well hydrated. She eats a fairly healthy diet. She does not exercise due to feeling so tired all the time.   HISTORY: (copied from Dr AtGuadelupe Sabinote on 06/24/2019)  [name] a [age]ear old right-handed woman with an underlying medical history of orthostasis and dysautonomia, low back pain, irritable bowel syndrome, Raynaud's disease, and restless leg syndrome, who presents for follow-up consultation of her sleep disorder, in particular, idiopathic hypersomnolence. The patient is unaccompanied today. I last saw her on 12/21/2018, at which time she reported that her Adderall was not as effective as it used to be, she was taking it once daily in the morning, 30 mg strength long-acting.  She was advised to add Provigil.  We started with the 100 mg strength once daily to up to twice daily.   Today, 06/24/2019: She reports doing okay, she does not always take her Provigil.  She has noticed that it sometimes keeps her up at night even if she only took 1 pill.  She also has noted more vivid dreams on the nights where she took the Provigil.  She has not tried just half a pill yet.  She continues to take Adderall long-acting 30 mg once daily through her primary care PA.  She was given 3 different prescriptions for a beta-blocker to try for her POTS through  cardiology but has only filled the atenolol but has not tried it quite yet.  She has somewhat reluctant secondary to side effect concerns including low blood pressure.  She is trying to find the right time to try to take 1 of these medications.  She has a virtual follow-up pending with cardiology.  The patient's allergies, current medications, family history, past medical history, past social history, past surgical history and problem list were reviewed and updated as appropriate.  Previously (copied from previous notes for reference):    I saw her in virtual visit on 09/16/2018 at which time we talked about her sleep study results. She was on symptomatic treatment with Adderall long-acting 20 mg once daily and doing fairly well. She was advised to continue with her medication regimen. We talked about potential adjunct medications or alternatives for future use.    I first met her at the request of Dr. CoGeorgina Snellat which time she reported a several year history of daytime somnolence, dating back to even teenage years. I suggested we proceed with extended sleep testing to rule out an underlying hypersomnolence disorder with narcolepsy within the possibilities. She had an interim baseline sleep study, followed by a next a nap study. I went over her test results with her in detail today. Her baseline sleep study from 08/31/2018 showed a sleep latency of 18 minutes, REM latency 71 minutes, sleep efficiency higher at 91.9% for a sleep  study situation. She had minimal to mild sleep fragmentation, she had an increased percentage of slow-wave sleep and REM sleep was normal at 22.9%. She had no significant sleep disordered breathing with an AHI of 0.1 per hour, average oxygen saturation of 95%, nadir of 93%. She had no significant PLMS. She had a next a MSLT on 09/01/2018 with a mean sleep latency of 5.1 minutes for 5 and one sleep onset REM period noted during nap #1.   07/28/2018: (She) reports a  several year history of excessive daytime somnolence, dating back to her teenage years. She does remember falling asleep in class at times. She has previously been labeled as depressed and treated for depression but feels like she is not depressed. She does have a history of anxiety. She is currently not taking the Vistaril. She has been labeled as chronic fatigue. She has been late to work a few months ago and this prompted her to seek more serious medical attention for her sleepiness. She was started on Adderall long-acting 20 mg once daily in May 2019. This was increased to 30 mg in September 2019. She has taken longer, up to 3 hours at a time. Therefore, she avoids taking a nap because she cannot take a short nap typically. She reports being able to recall dreams. She has some vivid dreams but no nightmares. She has been noted to have talk in her sleep. She denies any sleep paralysis but has had hypnagogic hallucinations or dream like sequences that she can see before drifting off to sleep. She may have had cataplexy type weakness in the context of feeling severely tired. She feels like her legs could give out or she will fall. She has had presyncopal and syncopal spells especially in hot environments or with sustained standing. She had workup from a cardiac standpoint including echocardiogram in December 2019 and a recent Holter monitor in January 2020. I reviewed your office note from 05/29/2018. Her Epworth sleepiness score is 16 out of 24 today, fatigue score is 58 out of 63. She lives with her fianc and her 2 sons, ages 59 and 22, and 1 daughter, age 5. She has a 83 year old stepson. She works full-time in [employer]. She is currently on Adderall 30 mg daily. She quit smoking cigarettes about a year and a half ago. She uses nicotine vapor occasionally. She does not drink alcohol currently, has had none in over one year, caffeine daily, usually one serving, rarely up to 3 servings per day. She is in bed typically  around 10 or 10:30, rise time is 6:15. They have 1 dog in the household, not upstairs in their bedroom typically. She has no family history of sleep apnea or narcolepsy. She was recently given a prescription for nadolol but has not started it. She occasionally takes meloxicam but has not taken the hydroxyzine.   REVIEW OF SYSTEMS: Out of a complete 14 system review of symptoms, the patient complains only of the following symptoms, fatigue, anxiety  and all other reviewed systems are negative.  ESS: 8 FSS: 58  ALLERGIES: Allergies  Allergen Reactions  . Cymbalta [Duloxetine Hcl]     Made exhaustion worse.   Rennis Harding [Vortioxetine]     Increasing IBS symptoms.   . Wellbutrin [Bupropion] Other (See Comments)    Worsening of symptoms    HOME MEDICATIONS: Outpatient Medications Prior to Visit  Medication Sig Dispense Refill  . [START ON 02/17/2020] amphetamine-dextroamphetamine (ADDERALL XR) 30 MG 24 hr capsule Take 1 capsule (30  mg total) by mouth every morning. 30 capsule 0  . atenolol (TENORMIN) 25 MG tablet Take 1 tablet (25 mg total) by mouth daily. 90 tablet 2  . meloxicam (MOBIC) 15 MG tablet One tab PO qAM with breakfast for 2 weeks, then daily prn pain. 30 tablet 3  . modafinil (PROVIGIL) 100 MG tablet Take 1 tablet (100 mg total) by mouth 2 (two) times daily as needed (take at 10 AM and second dose at 1-2 PM no later than 3 PM.). 60 tablet 5  . orphenadrine (NORFLEX) 100 MG tablet Take 100 mg by mouth 2 (two) times daily.    Derrill Memo ON 01/17/2020] amphetamine-dextroamphetamine (ADDERALL XR) 30 MG 24 hr capsule Take 1 capsule (30 mg total) by mouth daily. 30 capsule 0  . amphetamine-dextroamphetamine (ADDERALL XR) 30 MG 24 hr capsule Take 1 capsule (30 mg total) by mouth daily. 30 capsule 0   No facility-administered medications prior to visit.    PAST MEDICAL HISTORY: Past Medical History:  Diagnosis Date  . Chronic bilateral low back pain without sciatica 08/04/2017  .  Cubital tunnel syndrome on right 05/02/2017  . Depression, recurrent ([place]) 03/26/2017   04/08/2017 PHQ9 = 17, GAD7 = 18 05/29/2017 PHQ 9 = 2, GAD 7 = 4  . Mixed irritable bowel syndrome 05/29/2017  . Raynaud's disease without gangrene 08/04/2017  . RLS (restless legs syndrome) 08/04/2017    PAST SURGICAL HISTORY: Past Surgical History:  Procedure Laterality Date  . TUBAL LIGATION      FAMILY HISTORY: Family History  Problem Relation Age of Onset  . Diabetes Paternal Grandmother   . Diabetes Maternal Grandmother     SOCIAL HISTORY: Social History   Socioeconomic History  . Marital status: Significant Other    Spouse name: Not on file  . Number of children: Not on file  . Years of education: Not on file  . Highest education level: Not on file  Occupational History  . Not on file  Tobacco Use  . Smoking status: Never Smoker  . Smokeless tobacco: Never Used  Vaping Use  . Vaping Use: Every day  . Devices: does daily  Substance and Sexual Activity  . Alcohol use: Yes    Comment: Socially  . Drug use: No  . Sexual activity: Yes    Birth control/protection: Surgical  Other Topics Concern  . Not on file  Social History Narrative  . Not on file   Social Determinants of Health   Financial Resource Strain:   . Difficulty of Paying Living Expenses:   Food Insecurity:   . Worried About Charity fundraiser in the Last Year:   . Arboriculturist in the Last Year:   Transportation Needs:   . Film/video editor (Medical):   Marland Kitchen Lack of Transportation (Non-Medical):   Physical Activity:   . Days of Exercise per Week:   . Minutes of Exercise per Session:   Stress:   . Feeling of Stress :   Social Connections:   . Frequency of Communication with Friends and Family:   . Frequency of Social Gatherings with Friends and Family:   . Attends Religious Services:   . Active Member of Clubs or Organizations:   . Attends Archivist Meetings:   Marland Kitchen Marital Status:    Intimate Partner Violence:   . Fear of Current or Ex-Partner:   . Emotionally Abused:   Marland Kitchen Physically Abused:   . Sexually Abused:  PHYSICAL EXAM  Vitals:   12/23/19 1521  BP: 113/82  Pulse: (!) 112  Weight: 144 lb 3.2 oz (65.4 kg)  Height: 5' 4"  (1.626 m)   Body mass index is 24.75 kg/m.  Generalized: Well developed, in no acute distress  Cardiology: normal rate and rhythm, no murmur noted Respiratory: clear to auscultation bilaterally  Neurological examination  Mentation: Alert oriented to time, place, history taking. Follows all commands speech and language fluent Cranial nerve II-XII: Pupils were equal round reactive to light. Extraocular movements were full, visual field were full  Motor: The motor testing reveals 5 over 5 strength of all 4 extremities. Good symmetric motor tone is noted throughout.  Gait and station: Gait is normal.   DIAGNOSTIC DATA (LABS, IMAGING, TESTING) - I reviewed patient records, labs, notes, testing and imaging myself where available.  No flowsheet data found.   Lab Results  Component Value Date   WBC 4.5 08/01/2017   HGB 12.6 03/20/2018   HCT 39.6 08/01/2017   MCV 90.4 08/01/2017   PLT 277 08/01/2017      Component Value Date/Time   NA 139 08/01/2017 0904   K 4.7 08/01/2017 0904   CL 106 08/01/2017 0904   CO2 26 08/01/2017 0904   GLUCOSE 87 08/01/2017 0904   BUN 16 08/01/2017 0904   CREATININE 0.91 08/01/2017 0904   CALCIUM 9.5 08/01/2017 0904   PROT 7.1 08/01/2017 0904   ALBUMIN 4.1 02/20/2016 1159   AST 15 08/01/2017 0904   ALT 13 08/01/2017 0904   ALKPHOS 38 02/20/2016 1159   BILITOT 0.5 08/01/2017 0904   GFRNONAA 82 08/01/2017 0904   GFRAA 95 08/01/2017 0904   Lab Results  Component Value Date   CHOL 173 08/01/2017   HDL 69 08/01/2017   LDLCALC 95 08/01/2017   TRIG 32 08/01/2017   CHOLHDL 2.5 08/01/2017   Lab Results  Component Value Date   HGBA1C 5.5 08/21/2017   Lab Results  Component Value Date    VITAMINB12 798 08/01/2017   Lab Results  Component Value Date   TSH 1.56 08/01/2017       ASSESSMENT AND PLAN 39 y.o. year old female  has a past medical history of Chronic bilateral low back pain without sciatica (08/04/2017), Cubital tunnel syndrome on right (05/02/2017), Depression, recurrent ([place]) (03/26/2017), Mixed irritable bowel syndrome (05/29/2017), Raynaud's disease without gangrene (08/04/2017), and RLS (restless legs syndrome) (08/04/2017). here with     ICD-10-CM   1. Idiopathic hypersomnolence  G47.11     Marlisa is doing fairly well today.  She continues Adderall 30 mg daily prescribed by her primary care provider.  She does use Provigil 50 to 100 mg tablets up to twice daily as needed for additional support with energy.  She denies obvious adverse effects.  Fortunately, she feels stress levels have decreased since working from home.  She will continue current treatment plan.  She will follow-up as recommended by her treatment team.  She will return to see [place] in 6 months, sooner if needed.  She verbalizes understanding and agreement with this plan.   No orders of the defined types were placed in this encounter.    No orders of the defined types were placed in this encounter.     I spent 15 minutes with the patient. 50% of this time was spent counseling and educating patient on plan of care and medications.    Debbora Presto, FNP-C 12/23/2019, 3:56 PM [facility] [address], [address]  [place], [place] [address] [phone]  I reviewed the above note and documentation by the Nurse Practitioner and agree with the history, exam, assessment and plan as outlined above. I was available for consultation. Star Age, MD, PhD [facility])

## 2020-01-20 MED FILL — ADDERALL XR 30 MG CAP SA: 30 | 30 days supply | Qty: 30 | Fill #0

## 2020-02-09 NOTE — Progress Notes (Signed)
Selby,   Cholesterol is fantastic.  Kidney, liver, glucose is wonderful.  No anemia. CBC looks great.  Estrogen levels look good.  Progesterone levels look good.  Not in menopause.  Thyroid normal range.

## 2020-02-10 LAB — COMPLETE METABOLIC PANEL WITH GFR
AG Ratio: 1.3 (calc) (ref 1.0–2.5)
ALT: 12 U/L (ref 6–29)
AST: 14 U/L (ref 10–30)
Albumin: 4 g/dL (ref 3.6–5.1)
Alkaline phosphatase (APISO): 45 U/L (ref 31–125)
BUN: 16 mg/dL (ref 7–25)
CO2: 23 mmol/L (ref 20–32)
Calcium: 9 mg/dL (ref 8.6–10.2)
Chloride: 105 mmol/L (ref 98–110)
Creat: 0.92 mg/dL (ref 0.50–1.10)
GFR, Est African American: 92 mL/min/{1.73_m2} (ref >=60)
GFR, Est Non African American: 79 mL/min/{1.73_m2} (ref >=60)
Globulin: 3.1 g/dL (calc) (ref 1.9–3.7)
Glucose, Bld: 95 mg/dL (ref 65–99)
Potassium: 4.2 mmol/L (ref 3.5–5.3)
Sodium: 135 mmol/L (ref 135–146)
Total Bilirubin: 0.3 mg/dL (ref 0.2–1.2)
Total Protein: 7.1 g/dL (ref 6.1–8.1)

## 2020-02-10 LAB — LIPID PANEL W/REFLEX DIRECT LDL
Cholesterol: 168 mg/dL (ref <200)
HDL: 75 mg/dL (ref >=50)
LDL Cholesterol (Calc): 79 mg/dL (calc)
Non-HDL Cholesterol (Calc): 93 mg/dL (calc) (ref <130)
Total CHOL/HDL Ratio: 2.2 (calc) (ref <5.0)
Triglycerides: 56 mg/dL (ref <150)

## 2020-02-10 LAB — CBC WITH DIFFERENTIAL/PLATELET
Absolute Monocytes: 388 cells/uL (ref 200–950)
Basophils Absolute: 61 cells/uL (ref 0–200)
Basophils Relative: 1.2 %
Eosinophils Absolute: 240 cells/uL (ref 15–500)
Eosinophils Relative: 4.7 %
HCT: 40.5 % (ref 35.0–45.0)
Hemoglobin: 13.1 g/dL (ref 11.7–15.5)
Lymphs Abs: 2055 cells/uL (ref 850–3900)
MCH: 29.2 pg (ref 27.0–33.0)
MCHC: 32.3 g/dL (ref 32.0–36.0)
MCV: 90.2 fL (ref 80.0–100.0)
MPV: 10.6 fL (ref 7.5–12.5)
Monocytes Relative: 7.6 %
Neutro Abs: 2356 cells/uL (ref 1500–7800)
Neutrophils Relative %: 46.2 %
Platelets: 245 10*3/uL (ref 140–400)
RBC: 4.49 10*6/uL (ref 3.80–5.10)
RDW: 12.3 % (ref 11.0–15.0)
Total Lymphocyte: 40.3 %
WBC: 5.1 10*3/uL (ref 3.8–10.8)

## 2020-02-10 LAB — FSH/LH
FSH: 5.6 m[IU]/mL
LH: 14.2 m[IU]/mL

## 2020-02-10 LAB — TESTOSTERONE, TOTAL, LC/MS/MS: Testosterone, Total, LC-MS-MS: 56 ng/dL — ABNORMAL HIGH (ref 2–45)

## 2020-02-10 LAB — PROGESTERONE: Progesterone: 12 ng/mL

## 2020-02-10 LAB — ESTRADIOL: Estradiol: 121 pg/mL

## 2020-02-10 LAB — TSH: TSH: 2.47 mIU/L

## 2020-02-11 ENCOUNTER — Encounter: Payer: Self-pay | Admitting: Physician Assistant

## 2020-02-11 NOTE — Progress Notes (Signed)
Deajah,   Your testosterone is actually a little high and certainly does not need supplementation.

## 2020-02-18 MED FILL — ADDERALL XR 30 MG CAP SA: 30 | 30 days supply | Qty: 30 | Fill #0

## 2020-03-01 MED FILL — ADDERALL XR 30 MG CAP SA: 30 | 30 days supply | Qty: 30 | Fill #0

## 2020-03-30 ENCOUNTER — Other Ambulatory Visit: Payer: Self-pay | Admitting: Physician Assistant

## 2020-03-30 DIAGNOSIS — R5382 Chronic fatigue, unspecified: Secondary | ICD-10-CM

## 2020-03-30 DIAGNOSIS — G4710 Hypersomnia, unspecified: Secondary | ICD-10-CM

## 2020-03-30 DIAGNOSIS — G9332 Myalgic encephalomyelitis/chronic fatigue syndrome: Secondary | ICD-10-CM

## 2020-03-31 NOTE — Telephone Encounter (Signed)
Last written 02/17/2020 #30 no refills Last appt 12/17/2019 [facility] patient

## 2020-04-03 ENCOUNTER — Other Ambulatory Visit: Payer: Self-pay | Admitting: Physician Assistant

## 2020-04-03 MED ORDER — AMPHETAMINE-DEXTROAMPHET ER 30 MG PO CP24
30.0000 mg | ORAL_CAPSULE | ORAL | 0 refills | Status: DC
Start: 2020-06-03 — End: 2020-07-06

## 2020-04-03 MED ORDER — AMPHETAMINE-DEXTROAMPHET ER 30 MG PO CP24
30.0000 mg | ORAL_CAPSULE | ORAL | 0 refills | Status: DC
Start: 2020-05-04 — End: 2020-07-06

## 2020-04-03 MED ORDER — AMPHETAMINE-DEXTROAMPHET ER 30 MG PO CP24: 30.0000 mg | ORAL_CAPSULE | ORAL | 0 refills | Status: DC

## 2020-04-03 MED FILL — ADDERALL XR 30 MG CAP SA: 30 | 30 days supply | Qty: 30 | Fill #0

## 2020-04-03 NOTE — Addendum Note (Signed)
Addended by: Jomarie Longs on: 04/03/2020 10:42 AM   Modules accepted: Orders

## 2020-05-04 MED FILL — ADDERALL XR 30 MG CAP SA: 30 | 30 days supply | Qty: 30 | Fill #0

## 2020-06-05 MED FILL — ADDERALL XR 30 MG CAP SA: 30 | 30 days supply | Qty: 30 | Fill #0

## 2020-07-07 ENCOUNTER — Other Ambulatory Visit: Payer: Self-pay | Admitting: Physician Assistant

## 2020-07-07 ENCOUNTER — Telehealth (INDEPENDENT_AMBULATORY_CARE_PROVIDER_SITE_OTHER): Payer: No Typology Code available for payment source | Admitting: Physician Assistant

## 2020-07-07 ENCOUNTER — Encounter: Payer: Self-pay | Admitting: Physician Assistant

## 2020-07-07 VITALS — BP 114/75 | HR 86 | Temp 98.4°F | Ht 64.0 in | Wt 142.0 lb

## 2020-07-07 DIAGNOSIS — G4710 Hypersomnia, unspecified: Secondary | ICD-10-CM | POA: Diagnosis not present

## 2020-07-07 DIAGNOSIS — R5382 Chronic fatigue, unspecified: Secondary | ICD-10-CM | POA: Diagnosis not present

## 2020-07-07 DIAGNOSIS — G9332 Myalgic encephalomyelitis/chronic fatigue syndrome: Secondary | ICD-10-CM

## 2020-07-07 MED ORDER — AMPHETAMINE-DEXTROAMPHET ER 30 MG PO CP24: 30.0000 mg | ORAL_CAPSULE | ORAL | 0 refills | Status: DC

## 2020-07-07 MED FILL — ADDERALL XR 30 MG CAP SA: 30 | 30 days supply | Qty: 30 | Fill #0

## 2020-07-07 NOTE — Progress Notes (Signed)
..Virtual Visit via Video Note  I connected with Teresa Garcia on 07/07/20 at  9:10 AM EST by a video enabled telemedicine application and verified that I am speaking with the correct person using two identifiers.  Location: Patient: home Provider: clinic  .Marland KitchenParticipating in visit:  Patient: Teresa Garcia Provider: Tandy Gaw PA-C   I discussed the limitations of evaluation and management by telemedicine and the availability of in person appointments. The patient expressed understanding and agreed to proceed.  History of Present Illness: Patient is a 40 year old female with ADHD and hypersomnolence who presents to the clinic for medication refill.  She is doing stable.  She still struggles with fatigue but it is better than it has been in years past.  She still occasionally will do research to see if she can find anything or any reason why she has this fatigue.  She sleeps amazing at night.  She does not wake up feeling rested.  She is also seeing neurology.  No other concerns or complaints.  Denies any suicidal thoughts or homicidal idealizations.  .. Active Ambulatory Problems    Diagnosis Date Noted  . Myalgia and myositis 02/20/2016  . Decreased iron stores 02/21/2016  . Cervical paraspinal muscle spasm 03/29/2016  . Abnormal weight gain 06/06/2016  . Tonsil stone 08/05/2016  . Ecchymosis 01/20/2017  . Depression, recurrent (HCC) 03/26/2017  . Cubital tunnel syndrome on right 05/02/2017  . Mixed irritable bowel syndrome 05/29/2017  . Dysphagia 08/04/2017  . Chronic bilateral low back pain without sciatica 08/04/2017  . RLS (restless legs syndrome) 08/04/2017  . Paresthesia 08/04/2017  . Raynaud's disease without gangrene 08/04/2017  . Chronic fatigue syndrome 11/06/2017  . Non-restorative sleep 11/07/2017  . Hypersomnolence 11/07/2017  . Carpal tunnel syndrome 12/31/2017  . De Quervain's tenosynovitis, right 12/31/2017  . Menorrhagia 03/13/2018  . Papanicolaou smear  of cervix with low risk human papillomavirus (HPV) DNA test positive 04/15/2018  . Tibialis posterior tendinitis, right 04/21/2018  . Fibromyalgia syndrome 04/24/2018  . Dysautonomia (HCC) 04/27/2019   Resolved Ambulatory Problems    Diagnosis Date Noted  . Bilateral leg pain 02/20/2016  . No energy 02/20/2016  . Right knee pain 02/20/2016  . Inattention 07/15/2016  . Anxiety 09/25/2016  . Forgetfulness 09/25/2016  . Mood changes 03/27/2017  . Fullness of neck 08/04/2017  . Numbness and tingling of both lower extremities 08/15/2017   No Additional Past Medical History   Reviewed med, allergy, problem list.     Observations/Objective: No acute distress Normal mood and appearance  .Marland Kitchen Today's Vitals   07/07/20 0912  BP: 114/75  Pulse: 86  Temp: 98.4 F (36.9 C)  TempSrc: Oral  Weight: 142 lb (64.4 kg)  Height: 5\' 4"  (1.626 m)   Body mass index is 24.37 kg/m.    Assessment and Plan: Marland KitchenChristiann was seen today for adhd.  Diagnoses and all orders for this visit:  Chronic fatigue syndrome -     amphetamine-dextroamphetamine (ADDERALL XR) 30 MG 24 hr capsule; Take 1 capsule (30 mg total) by mouth every morning. -     amphetamine-dextroamphetamine (ADDERALL XR) 30 MG 24 hr capsule; Take 1 capsule (30 mg total) by mouth every morning. -     amphetamine-dextroamphetamine (ADDERALL XR) 30 MG 24 hr capsule; Take 1 capsule (30 mg total) by mouth every morning.  Hypersomnolence -     amphetamine-dextroamphetamine (ADDERALL XR) 30 MG 24 hr capsule; Take 1 capsule (30 mg total) by mouth every morning. -     amphetamine-dextroamphetamine (  ADDERALL XR) 30 MG 24 hr capsule; Take 1 capsule (30 mg total) by mouth every morning. -     amphetamine-dextroamphetamine (ADDERALL XR) 30 MG 24 hr capsule; Take 1 capsule (30 mg total) by mouth every morning.   Patient is here for 72-month follow-up on chronic fatigue syndrome and hypersomnolence.  She also sees neurology regularly.  She is  on Adderall and modafinil.  She is stable.  Refills given for 6 months.   Follow Up Instructions:    I discussed the assessment and treatment plan with the patient. The patient was provided an opportunity to ask questions and all were answered. The patient agreed with the plan and demonstrated an understanding of the instructions.   The patient was advised to call back or seek an in-person evaluation if the symptoms worsen or if the condition fails to improve as anticipated.    Tandy Gaw, PA-C

## 2020-08-10 MED FILL — ADDERALL XR 30 MG CAP SA: 30 | 30 days supply | Qty: 30 | Fill #0

## 2020-09-14 MED FILL — ADDERALL XR 30 MG CAP SA: 30 | 30 days supply | Qty: 30 | Fill #0

## 2020-10-13 ENCOUNTER — Other Ambulatory Visit: Payer: Self-pay | Admitting: Physician Assistant

## 2020-10-13 ENCOUNTER — Other Ambulatory Visit (HOSPITAL_BASED_OUTPATIENT_CLINIC_OR_DEPARTMENT_OTHER): Payer: Self-pay

## 2020-10-13 DIAGNOSIS — G9332 Myalgic encephalomyelitis/chronic fatigue syndrome: Secondary | ICD-10-CM

## 2020-10-13 DIAGNOSIS — R5382 Chronic fatigue, unspecified: Secondary | ICD-10-CM

## 2020-10-13 DIAGNOSIS — G4710 Hypersomnia, unspecified: Secondary | ICD-10-CM

## 2020-10-13 MED ORDER — AMPHETAMINE-DEXTROAMPHET ER 30 MG PO CP24
30.0000 mg | ORAL_CAPSULE | Freq: Every morning | ORAL | 0 refills | Status: DC
  Filled 2020-12-14: qty 30, 30d supply, fill #0

## 2020-10-13 MED ORDER — AMPHETAMINE-DEXTROAMPHET ER 30 MG PO CP24
30.0000 mg | ORAL_CAPSULE | ORAL | 0 refills | Status: DC
  Filled 2020-11-14: qty 30, 30d supply, fill #0

## 2020-10-13 MED ORDER — AMPHETAMINE-DEXTROAMPHET ER 30 MG PO CP24
30.0000 mg | ORAL_CAPSULE | ORAL | 0 refills | Status: DC
  Filled 2020-10-13: qty 30, 30d supply, fill #0

## 2020-10-13 NOTE — Telephone Encounter (Signed)
Last written 09/01/2020 #30 no refills Last appt 07/07/2020

## 2020-10-16 ENCOUNTER — Other Ambulatory Visit (HOSPITAL_BASED_OUTPATIENT_CLINIC_OR_DEPARTMENT_OTHER): Payer: Self-pay

## 2020-11-10 ENCOUNTER — Other Ambulatory Visit (HOSPITAL_BASED_OUTPATIENT_CLINIC_OR_DEPARTMENT_OTHER): Payer: Self-pay

## 2020-11-14 ENCOUNTER — Other Ambulatory Visit (HOSPITAL_BASED_OUTPATIENT_CLINIC_OR_DEPARTMENT_OTHER): Payer: Self-pay

## 2020-12-12 ENCOUNTER — Encounter (INDEPENDENT_AMBULATORY_CARE_PROVIDER_SITE_OTHER): Payer: Self-pay

## 2020-12-14 ENCOUNTER — Other Ambulatory Visit (HOSPITAL_BASED_OUTPATIENT_CLINIC_OR_DEPARTMENT_OTHER): Payer: Self-pay

## 2021-01-09 ENCOUNTER — Other Ambulatory Visit (HOSPITAL_BASED_OUTPATIENT_CLINIC_OR_DEPARTMENT_OTHER): Payer: Self-pay

## 2021-01-09 ENCOUNTER — Other Ambulatory Visit: Payer: Self-pay | Admitting: Physician Assistant

## 2021-01-09 DIAGNOSIS — G4710 Hypersomnia, unspecified: Secondary | ICD-10-CM

## 2021-01-09 DIAGNOSIS — R5382 Chronic fatigue, unspecified: Secondary | ICD-10-CM

## 2021-01-09 DIAGNOSIS — G9332 Myalgic encephalomyelitis/chronic fatigue syndrome: Secondary | ICD-10-CM

## 2021-01-09 MED ORDER — AMPHETAMINE-DEXTROAMPHET ER 30 MG PO CP24
30.0000 mg | ORAL_CAPSULE | Freq: Every morning | ORAL | 0 refills | Status: DC
  Filled 2021-01-09: qty 30, 30d supply, fill #0

## 2021-01-09 NOTE — Telephone Encounter (Signed)
Please schedule appt with patient

## 2021-01-09 NOTE — Telephone Encounter (Signed)
Need appt every 6 months. I will sent refill. Please schedule appt.

## 2021-01-09 NOTE — Telephone Encounter (Signed)
Last appt 07/07/2020 Last written 12/13/2020

## 2021-01-10 ENCOUNTER — Ambulatory Visit (INDEPENDENT_AMBULATORY_CARE_PROVIDER_SITE_OTHER): Payer: No Typology Code available for payment source | Admitting: Sports Medicine

## 2021-01-10 ENCOUNTER — Other Ambulatory Visit: Payer: Self-pay

## 2021-01-10 ENCOUNTER — Other Ambulatory Visit (HOSPITAL_BASED_OUTPATIENT_CLINIC_OR_DEPARTMENT_OTHER): Payer: Self-pay

## 2021-01-10 ENCOUNTER — Ambulatory Visit (INDEPENDENT_AMBULATORY_CARE_PROVIDER_SITE_OTHER): Payer: No Typology Code available for payment source

## 2021-01-10 DIAGNOSIS — M2550 Pain in unspecified joint: Secondary | ICD-10-CM | POA: Diagnosis not present

## 2021-01-10 DIAGNOSIS — M357 Hypermobility syndrome: Secondary | ICD-10-CM

## 2021-01-10 DIAGNOSIS — G8929 Other chronic pain: Secondary | ICD-10-CM

## 2021-01-10 DIAGNOSIS — G901 Familial dysautonomia [Riley-Day]: Secondary | ICD-10-CM

## 2021-01-10 DIAGNOSIS — M545 Low back pain, unspecified: Secondary | ICD-10-CM | POA: Diagnosis not present

## 2021-01-10 DIAGNOSIS — M62838 Other muscle spasm: Secondary | ICD-10-CM

## 2021-01-10 MED ORDER — PREDNISONE 50 MG PO TABS
ORAL_TABLET | ORAL | 0 refills | Status: DC
  Filled 2021-01-10: qty 5, 5d supply, fill #0

## 2021-01-10 MED ORDER — CYCLOBENZAPRINE HCL 10 MG PO TABS
ORAL_TABLET | ORAL | 0 refills | Status: DC
  Filled 2021-01-10: qty 30, 10d supply, fill #0

## 2021-01-10 NOTE — Assessment & Plan Note (Signed)
In addition to the widespread aches and pains in the conservative treatment we are going to go ahead and pull the trigger for full rheumatoid work-up as well.

## 2021-01-10 NOTE — Assessment & Plan Note (Signed)
Beighton score = 6, no specific intervention needed here other than consistent strength training.

## 2021-01-10 NOTE — Assessment & Plan Note (Signed)
As below Teresa Garcia is having increasing low back pain, we will start conservatively. If no better in 4 to 6 weeks we will proceed with MRI.

## 2021-01-10 NOTE — Assessment & Plan Note (Signed)
Teresa Garcia does have some orthostatic tachycardia, blood pressure tends to run low, tachycardia into the 120s when standing, presyncope when taking warm showers. It sounds like she has seen a cardiologist, she has tried liberalizing salt intake without improvement. Ultimately I think she is going to need Florinef. I have recommended she talk to [facility] about this.

## 2021-01-10 NOTE — Progress Notes (Signed)
    Procedures performed today:    None.  Independent interpretation of notes and tests performed by another provider:   None.  Brief History, Exam, Impression, and Recommendations:    Benign joint hypermobility syndrome Beighton score = 6, no specific intervention needed here other than consistent strength training.  Cervical paraspinal muscle spasm Teresa Garcia returns, she is a pleasant 40 year old female, we have treated her over the years for neck spasm and neck pain, x-rays have been unremarkable. Unfortunately she continues to get on and off pain in her neck with radiation around the trapezius, medial to the shoulder blades. Historically Flexeril, prednisone, meloxicam have been helpful. Refilling all of these, at this juncture she does need aggressive formal physical therapy. If failure after 4 to 6 weeks we will proceed with MRI, if the MRI is normal we will proceed down more of a myofascial pathway.  Chronic bilateral low back pain without sciatica As below Teresa Garcia is having increasing low back pain, we will start conservatively. If no better in 4 to 6 weeks we will proceed with MRI.  Polyarthralgia In addition to the widespread aches and pains in the conservative treatment we are going to go ahead and pull the trigger for full rheumatoid work-up as well.   Dysautonomia (HCC) Teresa Garcia does have some orthostatic tachycardia, blood pressure tends to run low, tachycardia into the 120s when standing, presyncope when taking warm showers. It sounds like she has seen a cardiologist, she has tried liberalizing salt intake without improvement. Ultimately I think she is going to need Florinef. I have recommended she talk to [facility] about this.    ___________________________________________ Ihor Austin. Benjamin Stain, M.D., ABFM., CAQSM. Primary Care and Sports Medicine [facility]  Adjunct Instructor of Family Medicine  [employer]

## 2021-01-10 NOTE — Assessment & Plan Note (Signed)
Teresa Garcia returns, she is a pleasant 40 year old female, we have treated her over the years for neck spasm and neck pain, x-rays have been unremarkable. Unfortunately she continues to get on and off pain in her neck with radiation around the trapezius, medial to the shoulder blades. Historically Flexeril, prednisone, meloxicam have been helpful. Refilling all of these, at this juncture she does need aggressive formal physical therapy. If failure after 4 to 6 weeks we will proceed with MRI, if the MRI is normal we will proceed down more of a myofascial pathway.

## 2021-01-12 ENCOUNTER — Other Ambulatory Visit (HOSPITAL_BASED_OUTPATIENT_CLINIC_OR_DEPARTMENT_OTHER): Payer: Self-pay

## 2021-01-15 LAB — LUPUS(12) PANEL
Anti Nuclear Antibody (ANA): NEGATIVE
C3 Complement: 122 mg/dL (ref 83–193)
C4 Complement: 18 mg/dL (ref 15–57)
ENA SM Ab Ser-aCnc: <1 AI
Rheumatoid fact SerPl-aCnc: <14 IU/mL (ref <14)
Ribosomal P Protein Ab: <1 AI
SM/RNP: <1 AI
SSA (Ro) (ENA) Antibody, IgG: <1 AI
SSB (La) (ENA) Antibody, IgG: <1 AI
Scleroderma (Scl-70) (ENA) Antibody, IgG: <1 AI
Thyroperoxidase Ab SerPl-aCnc: 2 IU/mL (ref <9)
ds DNA Ab: 4 IU/mL

## 2021-01-15 LAB — CBC
HCT: 42.3 % (ref 35.0–45.0)
Hemoglobin: 13.5 g/dL (ref 11.7–15.5)
MCH: 29.7 pg (ref 27.0–33.0)
MCHC: 31.9 g/dL — ABNORMAL LOW (ref 32.0–36.0)
MCV: 93 fL (ref 80.0–100.0)
MPV: 10.1 fL (ref 7.5–12.5)
Platelets: 294 10*3/uL (ref 140–400)
RBC: 4.55 10*6/uL (ref 3.80–5.10)
RDW: 12.7 % (ref 11.0–15.0)
WBC: 5.9 10*3/uL (ref 3.8–10.8)

## 2021-01-15 LAB — COMPREHENSIVE METABOLIC PANEL
AG Ratio: 1.4 (calc) (ref 1.0–2.5)
ALT: 17 U/L (ref 6–29)
AST: 16 U/L (ref 10–30)
Albumin: 4.3 g/dL (ref 3.6–5.1)
Alkaline phosphatase (APISO): 47 U/L (ref 31–125)
BUN: 11 mg/dL (ref 7–25)
CO2: 23 mmol/L (ref 20–32)
Calcium: 9.3 mg/dL (ref 8.6–10.2)
Chloride: 105 mmol/L (ref 98–110)
Creat: 0.82 mg/dL (ref 0.50–0.97)
Globulin: 3.1 g/dL (calc) (ref 1.9–3.7)
Glucose, Bld: 95 mg/dL (ref 65–99)
Potassium: 4.5 mmol/L (ref 3.5–5.3)
Sodium: 137 mmol/L (ref 135–146)
Total Bilirubin: 0.8 mg/dL (ref 0.2–1.2)
Total Protein: 7.4 g/dL (ref 6.1–8.1)

## 2021-01-15 LAB — RHEUMATOID FACTOR (IGA, IGG, IGM)
Rheumatoid Factor (IgA): <5 U (ref <=6)
Rheumatoid Factor (IgG): 5 U (ref <=6)
Rheumatoid Factor (IgM): 6 U (ref <=6)

## 2021-01-15 LAB — HLA-B27 ANTIGEN: HLA-B27 Antigen: NEGATIVE

## 2021-01-15 LAB — CYCLIC CITRUL PEPTIDE ANTIBODY, IGG: Cyclic Citrullin Peptide Ab: <16 UNITS

## 2021-01-15 LAB — SEDIMENTATION RATE: Sed Rate: 6 mm/h (ref 0–20)

## 2021-01-15 LAB — CK: Total CK: 39 U/L (ref 29–143)

## 2021-01-15 LAB — URIC ACID: Uric Acid, Serum: 3.1 mg/dL (ref 2.5–7.0)

## 2021-01-16 ENCOUNTER — Other Ambulatory Visit: Payer: Self-pay

## 2021-01-16 ENCOUNTER — Ambulatory Visit: Payer: No Typology Code available for payment source | Admitting: Physical Therapy

## 2021-01-16 ENCOUNTER — Encounter: Payer: Self-pay | Admitting: Physical Therapy

## 2021-01-16 DIAGNOSIS — M6281 Muscle weakness (generalized): Secondary | ICD-10-CM | POA: Diagnosis not present

## 2021-01-16 DIAGNOSIS — R293 Abnormal posture: Secondary | ICD-10-CM | POA: Diagnosis not present

## 2021-01-16 DIAGNOSIS — M542 Cervicalgia: Secondary | ICD-10-CM | POA: Diagnosis not present

## 2021-01-16 NOTE — Patient Instructions (Signed)
Access Code: [email or web address] URL: [email or web address] Date: 01/16/2021 Prepared by: Reggy Eye  Exercises Doorway Pec Stretch at 60 Elevation - 1 x daily - 7 x weekly - 3 sets - 1 reps - 20-30 sec hold Standing Bilateral Low Shoulder Row with Anchored Resistance - 1 x daily - 7 x weekly - 3 sets - 10 reps Shoulder extension with resistance - Neutral - 1 x daily - 7 x weekly - 3 sets - 10 reps Supine Chin Tuck - 1 x daily - 7 x weekly - 1 sets - 10 reps - 3-5 sec hold Supine Shoulder Horizontal Abduction with Resistance - 1 x daily - 7 x weekly - 3 sets - 10 reps

## 2021-01-16 NOTE — Therapy (Signed)
[facility] [place] [address] [place] [address] [place], [place], [address] Phone: [phone]   Fax:  [phone]  Physical Therapy Evaluation  Patient Details  Name: Teresa Garcia MRN: [id number] Date of Birth: Feb 05, 1981 Referring Provider (PT): thekkekandam   Encounter Date: 01/16/2021   PT End of Session - 01/16/21 0838     Visit Number 1    Number of Visits 12    Date for PT Re-Evaluation 02/27/21    PT Start Time 0802    PT Stop Time 0834    PT Time Calculation (min) 32 min    Activity Tolerance Patient tolerated treatment well    Behavior During Therapy [facility] for tasks assessed/performed             Past Medical History:  Diagnosis Date   Chronic bilateral low back pain without sciatica 08/04/2017   Cubital tunnel syndrome on right 05/02/2017   Depression, recurrent (HCC) 03/26/2017   04/08/2017 PHQ9 = 17, GAD7 = 18 05/29/2017 PHQ 9 = 2, GAD 7 = 4   Mixed irritable bowel syndrome 05/29/2017   Raynaud's disease without gangrene 08/04/2017   RLS (restless legs syndrome) 08/04/2017    Past Surgical History:  Procedure Laterality Date   TUBAL LIGATION      There were no vitals filed for this visit.    Subjective Assessment - 01/16/21 0804     Subjective Pt has had long standing neck pain for decades. In the past year it has become more frequent and pt has pain in neck and shoulders every 2-3 days. She states she feels her head is "heavy" and occasional has shooting pain from her neck into her head.  Pain is insiduous onset with no known triggers. Pain decreases with supine rest and meds.    Pertinent History Rt ulnar nerve entrapment    Limitations House hold activities;Lifting    Patient Stated Goals decrease frequency of pain    Currently in Pain? No/denies                [facility] PT Assessment - 01/16/21 0001       Assessment   Medical Diagnosis cervical paraspinal muscle spasm    Referring Provider (PT)  [email or web address]      Balance Screen   Has the patient fallen in the past 6 months No      Prior Function   Level of Independence Independent      Observation/Other Assessments   Focus on Therapeutic Outcomes (FOTO)  49 functional status measure      ROM / Strength   AROM / PROM / Strength AROM;Strength      AROM   AROM Assessment Site Cervical    Cervical Flexion 53    Cervical Extension 34    Cervical - Right Side Bend 50    Cervical - Left Side Bend 50    Cervical - Right Rotation 65    Cervical - Left Rotation 63      Strength   Overall Strength Comments shoulder strength grossly 4/5 bilat UE      Palpation   Spinal mobility hypomoble with PAs and lateral glides c spine    Palpation comment increased mm spasm bilat upper traps, levator and suboccipitals                        Objective measurements completed on examination: See above findings.       [facility] Adult PT Treatment/Exercise - 01/16/21 0001  Exercises   Exercises Neck      Neck Exercises: Theraband   Shoulder Extension 10 reps    Shoulder Extension Limitations red TB    Rows 10 reps    Rows Limitations red TB    Horizontal ABduction 10 reps    Horizontal ABduction Limitations red TB      Neck Exercises: Supine   Neck Retraction 5 reps;5 secs      Neck Exercises: Stretches   Other Neck Stretches doorway stretch 2 x 20 sec                    PT Education - 01/16/21 0838     Education Details PT POC, goals, HEP    Person(s) Educated Patient    Methods Explanation;Demonstration;Handout    Comprehension Returned demonstration;Verbalized understanding                 PT Long Term Goals - 01/16/21 0842       PT LONG TERM GOAL #1   Title Pt will be independent in HEP    Time 6    Period Weeks    Status New    Target Date 02/27/21      PT LONG TERM GOAL #2   Title Pt will improve FOTO to >= 61 to demo improved functional mobility    Time 6    Period  Weeks    Status New    Target Date 02/27/21      PT LONG TERM GOAL #3   Title Pt will report <= 3 instances of "flare up" of neck pain per month    Time 6    Period Weeks    Status New    Target Date 02/27/21                    Plan - 01/16/21 0839     Clinical Impression Statement Pt is a 40 y/o female referred for cervical paraspinal mm spasm. Pt presents with insiduous onset neck pain, increased mm spasticity, decreased postural strength and endurance, decreased activity tolerance. Pt will benefit from skilled PT to address deficits and improve functional mobility    Personal Factors and Comorbidities Time since onset of injury/illness/exacerbation;Fitness;Comorbidity 1    Examination-Activity Limitations Lift    Examination-Participation Restrictions Cleaning;Yard Work;Occupation;Community Activity    Stability/Clinical Decision Making Stable/Uncomplicated    Clinical Decision Making Low    Rehab Potential Good    PT Frequency 2x / week    PT Duration 6 weeks    PT Treatment/Interventions Cryotherapy;Iontophoresis 4mg /ml Dexamethasone;Electrical Stimulation;Moist Heat;Therapeutic activities;Therapeutic exercise;Neuromuscular re-education;Patient/family education;Manual techniques;Taping;Dry needling;Passive range of motion    PT Next Visit Plan assess HEP, progress postural strength and endurance    PT Home Exercise Plan [email or web address]    Consulted and Agree with Plan of Care Patient             Patient will benefit from skilled therapeutic intervention in order to improve the following deficits and impairments:  Decreased endurance, Hypermobility, Pain, Postural dysfunction, Increased muscle spasms  Visit Diagnosis: Abnormal posture - Plan: PT plan of care cert/re-cert  Muscle weakness (generalized) - Plan: PT plan of care cert/re-cert  Cervicalgia - Plan: PT plan of care cert/re-cert     Problem List Patient Active Problem List   Diagnosis Date Noted    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   Polyarthralgia 01/10/2021   Dysautonomia (HCC) 04/27/2019   Fibromyalgia syndrome 04/24/2018   Tibialis posterior tendinitis, right  04/21/2018   Papanicolaou smear of cervix with low risk human papillomavirus (HPV) DNA test positive 04/15/2018   Menorrhagia 03/13/2018   Carpal tunnel syndrome 12/31/2017   De Quervain's tenosynovitis, right 12/31/2017   Non-restorative sleep 11/07/2017   Hypersomnolence 11/07/2017   Chronic fatigue syndrome 11/06/2017   Dysphagia 08/04/2017   Chronic bilateral low back pain without sciatica 08/04/2017   RLS (restless legs syndrome) 08/04/2017   Paresthesia 08/04/2017   Raynaud's disease without gangrene 08/04/2017   Mixed irritable bowel syndrome 05/29/2017   Cubital tunnel syndrome on right 05/02/2017   Depression, recurrent (HCC) 03/26/2017   Ecchymosis 01/20/2017   Tonsil stone 08/05/2016   Abnormal weight gain 06/06/2016   Cervical paraspinal muscle spasm 03/29/2016   Decreased iron stores 02/21/2016   Myalgia and myositis 02/20/2016   Jannell Franta, PT  Colin Ellers 01/16/2021, 8:47 AM  [facility] [address] [place] [address] [place], [place], [address] Phone: [phone]   Fax:  [phone]  Name: Teresa Garcia MRN: [id number] Date of Birth: 04/05/1981

## 2021-01-18 ENCOUNTER — Encounter: Payer: Self-pay | Admitting: Physical Therapy

## 2021-01-18 ENCOUNTER — Ambulatory Visit (INDEPENDENT_AMBULATORY_CARE_PROVIDER_SITE_OTHER): Payer: No Typology Code available for payment source | Admitting: Physical Therapy

## 2021-01-18 ENCOUNTER — Other Ambulatory Visit: Payer: Self-pay

## 2021-01-18 DIAGNOSIS — M6281 Muscle weakness (generalized): Secondary | ICD-10-CM

## 2021-01-18 DIAGNOSIS — R293 Abnormal posture: Secondary | ICD-10-CM | POA: Diagnosis not present

## 2021-01-18 DIAGNOSIS — M542 Cervicalgia: Secondary | ICD-10-CM

## 2021-01-18 NOTE — Therapy (Signed)
[facility] [place] [address] [place] [address] [place], [place], [address] Phone: [phone]   Fax:  [phone]  Physical Therapy Treatment  Patient Details  Name: Teresa Garcia MRN: [id number] Date of Birth: 1980-11-23 Referring Provider (PT): thekkekandam   Encounter Date: 01/18/2021   PT End of Session - 01/18/21 0849     Visit Number 2    Number of Visits 12    Date for PT Re-Evaluation 02/27/21    PT Start Time 0849    PT Stop Time 0931    PT Time Calculation (min) 42 min    Activity Tolerance Patient tolerated treatment well    Behavior During Therapy [facility] for tasks assessed/performed             Past Medical History:  Diagnosis Date   Chronic bilateral low back pain without sciatica 08/04/2017   Cubital tunnel syndrome on right 05/02/2017   Depression, recurrent (HCC) 03/26/2017   04/08/2017 PHQ9 = 17, GAD7 = 18 05/29/2017 PHQ 9 = 2, GAD 7 = 4   Mixed irritable bowel syndrome 05/29/2017   Raynaud's disease without gangrene 08/04/2017   RLS (restless legs syndrome) 08/04/2017    Past Surgical History:  Procedure Laterality Date   TUBAL LIGATION      There were no vitals filed for this visit.   Subjective Assessment - 01/18/21 0849     Subjective I'm feeling really good now.    Pertinent History Rt ulnar nerve entrapment    Limitations House hold activities;Lifting    Patient Stated Goals decrease frequency of pain    Currently in Pain? No/denies                               [facility] Adult PT Treatment/Exercise - 01/18/21 0001       Self-Care   Self-Care Other Self-Care Comments    Other Self-Care Comments  pain neuroscience and PT encouraged her to start using imagery of when she used to do Zumba and walking with min pain to help re-program her neural pathways. Also discussed diet/sleep.      Neck Exercises: Theraband   Shoulder Extension 10 reps;20 reps    Shoulder Extension Limitations red  TB    Rows 20 reps    Rows Limitations red TB    Shoulder External Rotation 20 reps;Red    Shoulder External Rotation Limitations on noodle with scapular retraction    Horizontal ABduction 10 reps   supine   Horizontal ABduction Limitations red TB      Neck Exercises: Supine   Neck Retraction 10 reps;5 secs      Neck Exercises: Prone   Plank 5 x max hold; side plank 1 ea max hold    Other Prone Exercise T with 1#, Y no wt, goal post no wt  x 10 ea      Neck Exercises: Stabilization   Stabilization prone on elbows: neck retraction x 5, with arm reaches x 5 ea (instability in lower spine); cervical isometrics 10 sec x 5 ea      Neck Exercises: Stretches   Other Neck Stretches doorway stretch 2 x 20 sec encouraging breathing                         PT Long Term Goals - 01/16/21 0093       PT LONG TERM GOAL #1   Title Pt will be  independent in HEP    Time 6    Period Weeks    Status New    Target Date 02/27/21      PT LONG TERM GOAL #2   Title Pt will improve FOTO to >= 61 to demo improved functional mobility    Time 6    Period Weeks    Status New    Target Date 02/27/21      PT LONG TERM GOAL #3   Title Pt will report <= 3 instances of "flare up" of neck pain per month    Time 6    Period Weeks    Status New    Target Date 02/27/21                   Plan - 01/18/21 1239     Clinical Impression Statement Patient reporting no pain today and feeling better since last visit. We worked on postural strengthening and general stabilization as she is very hypermobile. She reports all over pain and chronic fatigue. She says she used to do Zumba all the time but now her body hurts too much to do it. She does not walk or do any other regular exercise either due to fear of pain. We discussed pain neuroscience and PT encouraged her to start using imagery of when she used to do Zumba and walking with min pain to help re-program her neural pathways. Also  discussed diet/sleep. She will benefit from continued focus on PNE. She did well with therex today with no reports of increased pain.    PT Treatment/Interventions Cryotherapy;Iontophoresis 4mg /ml Dexamethasone;Electrical Stimulation;Moist Heat;Therapeutic activities;Therapeutic exercise;Neuromuscular re-education;Patient/family education;Manual techniques;Taping;Dry needling;Passive range of motion    PT Next Visit Plan assess HEP, progress postural strength and endurance, continue with PNE    PT Home Exercise Plan [email or web address]             Patient will benefit from skilled therapeutic intervention in order to improve the following deficits and impairments:  Decreased endurance, Hypermobility, Pain, Postural dysfunction, Increased muscle spasms  Visit Diagnosis: Abnormal posture  Muscle weakness (generalized)  Cervicalgia     Problem List Patient Active Problem List   Diagnosis Date Noted   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   Polyarthralgia 01/10/2021   Dysautonomia (HCC) 04/27/2019   Fibromyalgia syndrome 04/24/2018   Tibialis posterior tendinitis, right 04/21/2018   Papanicolaou smear of cervix with low risk human papillomavirus (HPV) DNA test positive 04/15/2018   Menorrhagia 03/13/2018   Carpal tunnel syndrome 12/31/2017   De Quervain's tenosynovitis, right 12/31/2017   Non-restorative sleep 11/07/2017   Hypersomnolence 11/07/2017   Chronic fatigue syndrome 11/06/2017   Dysphagia 08/04/2017   Chronic bilateral low back pain without sciatica 08/04/2017   RLS (restless legs syndrome) 08/04/2017   Paresthesia 08/04/2017   Raynaud's disease without gangrene 08/04/2017   Mixed irritable bowel syndrome 05/29/2017   Cubital tunnel syndrome on right 05/02/2017   Depression, recurrent (HCC) 03/26/2017   Ecchymosis 01/20/2017   Tonsil stone 08/05/2016   Abnormal weight gain 06/06/2016   Cervical paraspinal muscle spasm 03/29/2016   Decreased iron stores 02/21/2016    Myalgia and myositis 02/20/2016    04/21/2016 PT 01/18/2021, 12:52 PM  [facility] [place] [address] [place], [place], [place] Phone: [phone]   Fax:  [phone]  Name: Pocahontas Cohenour MRN: [name] Date of Birth: 07-02-1980

## 2021-01-23 ENCOUNTER — Other Ambulatory Visit: Payer: Self-pay

## 2021-01-23 ENCOUNTER — Ambulatory Visit: Payer: No Typology Code available for payment source | Admitting: Physical Therapy

## 2021-01-23 DIAGNOSIS — R293 Abnormal posture: Secondary | ICD-10-CM | POA: Diagnosis not present

## 2021-01-23 DIAGNOSIS — M6281 Muscle weakness (generalized): Secondary | ICD-10-CM | POA: Diagnosis not present

## 2021-01-23 DIAGNOSIS — M542 Cervicalgia: Secondary | ICD-10-CM

## 2021-01-23 NOTE — Patient Instructions (Signed)
Patient Education Trigger Point Dry Needling 

## 2021-01-23 NOTE — Therapy (Signed)
[facility] [place] [address] [place] [address] [place], [place], [address] Phone: [phone]   Fax:  [phone]  Physical Therapy Treatment  Patient Details  Name: Teresa Garcia MRN: [id number] Date of Birth: June 13, 1981 Referring Provider (PT): thekkekandam   Encounter Date: 01/23/2021   PT End of Session - 01/23/21 1138     Visit Number 3    Number of Visits 12    Date for PT Re-Evaluation 02/27/21    PT Start Time 1100    PT Stop Time 1139    PT Time Calculation (min) 39 min    Activity Tolerance Patient tolerated treatment well    Behavior During Therapy [facility] for tasks assessed/performed             Past Medical History:  Diagnosis Date   Chronic bilateral low back pain without sciatica 08/04/2017   Cubital tunnel syndrome on right 05/02/2017   Depression, recurrent (HCC) 03/26/2017   04/08/2017 PHQ9 = 17, GAD7 = 18 05/29/2017 PHQ 9 = 2, GAD 7 = 4   Mixed irritable bowel syndrome 05/29/2017   Raynaud's disease without gangrene 08/04/2017   RLS (restless legs syndrome) 08/04/2017    Past Surgical History:  Procedure Laterality Date   TUBAL LIGATION      There were no vitals filed for this visit.   Subjective Assessment - 01/23/21 1103     Subjective Pt states she is done with the prednisone now and had some pain on Sunday night. Today her neck only hurts when she moves in "certain ways"    Patient Stated Goals decrease frequency of pain    Currently in Pain? No/denies                               [facility] Adult PT Treatment/Exercise - 01/23/21 0001       Neck Exercises: Machines for Strengthening   UBE (Upper Arm Bike) L2 x 4 min alt fwd/bkwd      Neck Exercises: Theraband   Shoulder Extension 20 reps    Shoulder Extension Limitations red TB    Rows 20 reps    Rows Limitations red TB    Shoulder External Rotation 20 reps;Red    Shoulder External Rotation Limitations on noodle with scapular  retraction    Other Theraband Exercises bow and arrow red TB x 10 bilat      Neck Exercises: Prone   Other Prone Exercise T, Y each 1 UE at a time, no weight      Neck Exercises: Stretches   Other Neck Stretches doorway stretch 2 x 20 sec encouraging breathing      Manual Therapy   Manual Therapy Soft tissue mobilization    Manual therapy comments skilled palpation to assess effects of dry needling    Soft tissue mobilization Lt rhomoboid and thoracic paraspinals T3-T6, upper traps              Trigger Point Dry Needling - 01/23/21 0001     Consent Given? Yes    Education Handout Provided Yes    Muscles Treated Head and Neck Upper trapezius;Levator scapulae    Muscles Treated Upper Quadrant Rhomboids;Middle trapezius    Upper Trapezius Response Twitch reponse elicited    Levator Scapulae Response Twitch response elicited    Rhomboids Response Twitch response elicited    Middle trapezius Response Twitch response elicited  PT Education - 01/23/21 1136     Education Details dry needling    Person(s) Educated Patient    Methods Explanation;Demonstration;Handout    Comprehension Verbalized understanding                 PT Long Term Goals - 01/16/21 0842       PT LONG TERM GOAL #1   Title Pt will be independent in HEP    Time 6    Period Weeks    Status New    Target Date 02/27/21      PT LONG TERM GOAL #2   Title Pt will improve FOTO to >= 61 to demo improved functional mobility    Time 6    Period Weeks    Status New    Target Date 02/27/21      PT LONG TERM GOAL #3   Title Pt will report <= 3 instances of "flare up" of neck pain per month    Time 6    Period Weeks    Status New    Target Date 02/27/21                   Plan - 01/23/21 1139     Clinical Impression Statement Pt with increased mm spasticity Lt rhomboids, thoracic paraspinals, upper traps which all responded well to dry needling. Pt with good  tolerance to progression of postural exercises    PT Next Visit Plan assess dry needling, postural strength and endurance, serratus strength    PT Home Exercise Plan [email or web address]    Consulted and Agree with Plan of Care Patient             Patient will benefit from skilled therapeutic intervention in order to improve the following deficits and impairments:     Visit Diagnosis: Abnormal posture  Muscle weakness (generalized)  Cervicalgia     Problem List Patient Active Problem List   Diagnosis Date Noted   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   Polyarthralgia 01/10/2021   Dysautonomia (HCC) 04/27/2019   Fibromyalgia syndrome 04/24/2018   Tibialis posterior tendinitis, right 04/21/2018   Papanicolaou smear of cervix with low risk human papillomavirus (HPV) DNA test positive 04/15/2018   Menorrhagia 03/13/2018   Carpal tunnel syndrome 12/31/2017   De Quervain's tenosynovitis, right 12/31/2017   Non-restorative sleep 11/07/2017   Hypersomnolence 11/07/2017   Chronic fatigue syndrome 11/06/2017   Dysphagia 08/04/2017   Chronic bilateral low back pain without sciatica 08/04/2017   RLS (restless legs syndrome) 08/04/2017   Paresthesia 08/04/2017   Raynaud's disease without gangrene 08/04/2017   Mixed irritable bowel syndrome 05/29/2017   Cubital tunnel syndrome on right 05/02/2017   Depression, recurrent (HCC) 03/26/2017   Ecchymosis 01/20/2017   Tonsil stone 08/05/2016   Abnormal weight gain 06/06/2016   Cervical paraspinal muscle spasm 03/29/2016   Decreased iron stores 02/21/2016   Myalgia and myositis 02/20/2016   Shaneya Taketa, PT  Darline Faith 01/23/2021, 11:41 AM  [facility] [address] [place] [address] [place], [place], [address] Phone: [phone]   Fax:  [phone]  Name: Ahri Olson MRN: [id number] Date of Birth: 01/19/1981

## 2021-01-26 ENCOUNTER — Encounter: Payer: No Typology Code available for payment source | Admitting: Physical Therapy

## 2021-01-30 ENCOUNTER — Other Ambulatory Visit: Payer: Self-pay

## 2021-01-30 ENCOUNTER — Ambulatory Visit: Payer: No Typology Code available for payment source | Admitting: Physical Therapy

## 2021-01-30 DIAGNOSIS — M6281 Muscle weakness (generalized): Secondary | ICD-10-CM

## 2021-01-30 DIAGNOSIS — M542 Cervicalgia: Secondary | ICD-10-CM

## 2021-01-30 DIAGNOSIS — R293 Abnormal posture: Secondary | ICD-10-CM | POA: Diagnosis not present

## 2021-01-30 NOTE — Patient Instructions (Signed)
Access Code: [email or web address] URL: [email or web address] Date: 01/30/2021 Prepared by: Reggy Eye  Exercises Doorway Pec Stretch at 60 Elevation - 1 x daily - 7 x weekly - 3 sets - 1 reps - 20-30 sec hold Standing Bilateral Low Shoulder Row with Anchored Resistance - 1 x daily - 7 x weekly - 3 sets - 10 reps Shoulder extension with resistance - Neutral - 1 x daily - 7 x weekly - 3 sets - 10 reps Standing Shoulder Single Arm PNF D2 Flexion with Resistance - 1 x daily - 7 x weekly - 2 sets - 10 reps Prone Single Arm Shoulder Y with Dumbbell - 1 x daily - 7 x weekly - 2 sets - 10 reps Prone Single Arm Shoulder Horizontal Abduction with Dumbbell - 1 x daily - 7 x weekly - 2 sets - 10 reps

## 2021-01-30 NOTE — Therapy (Signed)
[facility] [place] [address] [employer] [address] [place], [place], [address] Phone: [phone]   Fax:  [phone]  Physical Therapy Treatment  Patient Details  Name: Teresa Garcia MRN: [id number] Date of Birth: 05/10/1981 Referring Provider (PT): thekkekandam   Encounter Date: 01/30/2021   PT End of Session - 01/30/21 1135     Visit Number 4    Number of Visits 12    Date for PT Re-Evaluation 02/27/21    PT Start Time 1100    PT Stop Time 1139    PT Time Calculation (min) 39 min    Activity Tolerance Patient tolerated treatment well    Behavior During Therapy [facility] for tasks assessed/performed             Past Medical History:  Diagnosis Date   Chronic bilateral low back pain without sciatica 08/04/2017   Cubital tunnel syndrome on right 05/02/2017   Depression, recurrent (HCC) 03/26/2017   04/08/2017 PHQ9 = 17, GAD7 = 18 05/29/2017 PHQ 9 = 2, GAD 7 = 4   Mixed irritable bowel syndrome 05/29/2017   Raynaud's disease without gangrene 08/04/2017   RLS (restless legs syndrome) 08/04/2017    Past Surgical History:  Procedure Laterality Date   TUBAL LIGATION      There were no vitals filed for this visit.   Subjective Assessment - 01/30/21 1102     Subjective Pt states she felt great after dry needling last visit. States her neck is "a little sore because I slept funny"    Currently in Pain? Yes    Pain Score 1     Pain Location Neck    Pain Orientation Posterior;Right    Pain Descriptors / Indicators Sore                [facility] PT Assessment - 01/30/21 0001       Assessment   Medical Diagnosis cervical paraspinal muscle spasm    Referring Provider (PT) thekkekandam      AROM   Cervical - Right Rotation 70    Cervical - Left Rotation 69                           [facility] Adult PT Treatment/Exercise - 01/30/21 0001       Neck Exercises: Machines for Strengthening   UBE (Upper Arm Bike) L3 x 4 min  alt fwd/bkwd      Neck Exercises: Theraband   Shoulder Extension 20 reps    Shoulder Extension Limitations green TB    Rows 20 reps    Rows Limitations green TB    Shoulder External Rotation 20 reps;Red    Shoulder External Rotation Limitations on noodle with scapular retraction    Other Theraband Exercises bow and arrow green TB x 10 bilat      Neck Exercises: Standing   Upper Extremity D2 Extension;10 reps    Theraband Level (UE D2) Level 2 (Red)    Other Standing Exercises ball on wall with CW/CCW x 1 min bilat      Neck Exercises: Prone   Other Prone Exercise T, Y x 15 each, 1 UE at a time with 1#      Manual Therapy   Soft tissue mobilization TPR suboccipital release Rt > Lt                    PT Education - 01/30/21 1135     Education Details updated HEP  Person(s) Educated Patient    Methods Explanation;Demonstration;Handout    Comprehension Returned demonstration;Verbalized understanding                 PT Long Term Goals - 01/16/21 0842       PT LONG TERM GOAL #1   Title Pt will be independent in HEP    Time 6    Period Weeks    Status New    Target Date 02/27/21      PT LONG TERM GOAL #2   Title Pt will improve FOTO to >= 61 to demo improved functional mobility    Time 6    Period Weeks    Status New    Target Date 02/27/21      PT LONG TERM GOAL #3   Title Pt will report <= 3 instances of "flare up" of neck pain per month    Time 6    Period Weeks    Status New    Target Date 02/27/21                   Plan - 01/30/21 1139     Clinical Impression Statement Pt with improved cervical ROM and decreased mm spasticity noted today. Some decreased postural endurance noted but overall improving with all mobility    PT Next Visit Plan check goals, progress to d/c to HEP, postural strength and endurance    PT Home Exercise Plan [email or web address]    Consulted and Agree with Plan of Care Patient             Patient will benefit  from skilled therapeutic intervention in order to improve the following deficits and impairments:     Visit Diagnosis: Abnormal posture  Muscle weakness (generalized)  Cervicalgia     Problem List Patient Active Problem List   Diagnosis Date Noted   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   Polyarthralgia 01/10/2021   Dysautonomia (HCC) 04/27/2019   Fibromyalgia syndrome 04/24/2018   Tibialis posterior tendinitis, right 04/21/2018   Papanicolaou smear of cervix with low risk human papillomavirus (HPV) DNA test positive 04/15/2018   Menorrhagia 03/13/2018   Carpal tunnel syndrome 12/31/2017   De Quervain's tenosynovitis, right 12/31/2017   Non-restorative sleep 11/07/2017   Hypersomnolence 11/07/2017   Chronic fatigue syndrome 11/06/2017   Dysphagia 08/04/2017   Chronic bilateral low back pain without sciatica 08/04/2017   RLS (restless legs syndrome) 08/04/2017   Paresthesia 08/04/2017   Raynaud's disease without gangrene 08/04/2017   Mixed irritable bowel syndrome 05/29/2017   Cubital tunnel syndrome on right 05/02/2017   Depression, recurrent (HCC) 03/26/2017   Ecchymosis 01/20/2017   Tonsil stone 08/05/2016   Abnormal weight gain 06/06/2016   Cervical paraspinal muscle spasm 03/29/2016   Decreased iron stores 02/21/2016   Myalgia and myositis 02/20/2016   Teresa Garcia, PT  Teresa Garcia 01/30/2021, 11:42 AM  [facility] [address] [place] [address] [place], [place], [address] Phone: [phone]   Fax:  [phone]  Name: Teresa Garcia MRN: [id number] Date of Birth: 1980-12-14

## 2021-02-02 ENCOUNTER — Other Ambulatory Visit: Payer: Self-pay

## 2021-02-02 ENCOUNTER — Ambulatory Visit: Payer: No Typology Code available for payment source | Admitting: Physical Therapy

## 2021-02-02 DIAGNOSIS — R293 Abnormal posture: Secondary | ICD-10-CM

## 2021-02-02 DIAGNOSIS — M6281 Muscle weakness (generalized): Secondary | ICD-10-CM

## 2021-02-02 DIAGNOSIS — M542 Cervicalgia: Secondary | ICD-10-CM

## 2021-02-02 NOTE — Therapy (Signed)
[facility] [place] [address] [place] [address] [place], [place], [address] Phone: [phone]   Fax:  [phone]  Physical Therapy Treatment  Patient Details  Name: Teresa Garcia MRN: [id number] Date of Birth: December 19, 1980 Referring Provider (PT): thekkekandam   Encounter Date: 02/02/2021   PT End of Session - 02/02/21 1002     Visit Number 5    Number of Visits 12    Date for PT Re-Evaluation 02/27/21    PT Start Time 0930    PT Stop Time 1006    PT Time Calculation (min) 36 min    Activity Tolerance Patient tolerated treatment well    Behavior During Therapy [facility] for tasks assessed/performed             Past Medical History:  Diagnosis Date   Chronic bilateral low back pain without sciatica 08/04/2017   Cubital tunnel syndrome on right 05/02/2017   Depression, recurrent (HCC) 03/26/2017   04/08/2017 PHQ9 = 17, GAD7 = 18 05/29/2017 PHQ 9 = 2, GAD 7 = 4   Mixed irritable bowel syndrome 05/29/2017   Raynaud's disease without gangrene 08/04/2017   RLS (restless legs syndrome) 08/04/2017    Past Surgical History:  Procedure Laterality Date   TUBAL LIGATION      There were no vitals filed for this visit.   Subjective Assessment - 02/02/21 0934     Subjective Pt states she is "feeling good today"    Patient Stated Goals decrease frequency of pain    Currently in Pain? No/denies                [facility] PT Assessment - 02/02/21 0001       Assessment   Medical Diagnosis cervical paraspinal muscle spasm    Referring Provider (PT) thekkekandam                           [facility] Adult PT Treatment/Exercise - 02/02/21 0001       Neck Exercises: Machines for Strengthening   UBE (Upper Arm Bike) L3 x 4 min alt fwd/bkwd      Neck Exercises: Theraband   Shoulder Extension 20 reps    Shoulder Extension Limitations green TB    Rows 20 reps    Rows Limitations green TB    Other Theraband Exercises bow and arrow  green TB x 10 bilat    Other Theraband Exercises serratus wall clock red TB x 10 bilat      Neck Exercises: Standing   Upper Extremity D2 Extension;10 reps    Theraband Level (UE D2) Level 2 (Red)    Other Standing Exercises reverse wall push up x 10      Neck Exercises: Prone   Other Prone Exercise T, Y x 15 each, 1 UE at a time with 1#      Manual Therapy   Soft tissue mobilization TPR suboccipital release, STM bilat upper traps                         PT Long Term Goals - 01/16/21 4097       PT LONG TERM GOAL #1   Title Pt will be independent in HEP    Time 6    Period Weeks    Status New    Target Date 02/27/21      PT LONG TERM GOAL #2   Title Pt will improve FOTO to >= 61  to demo improved functional mobility    Time 6    Period Weeks    Status New    Target Date 02/27/21      PT LONG TERM GOAL #3   Title Pt will report <= 3 instances of "flare up" of neck pain per month    Time 6    Period Weeks    Status New    Target Date 02/27/21                   Plan - 02/02/21 1007     Clinical Impression Statement Pt with decreased mm spasticity in cervical and shoulder mm. Good tolerance to progression of scapular stabilization and postural exercises    PT Next Visit Plan note for MD, check goals, d/c, update HEP    PT Home Exercise Plan [email or web address]    Consulted and Agree with Plan of Care Patient             Patient will benefit from skilled therapeutic intervention in order to improve the following deficits and impairments:     Visit Diagnosis: Abnormal posture  Muscle weakness (generalized)  Cervicalgia     Problem List Patient Active Problem List   Diagnosis Date Noted   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   Polyarthralgia 01/10/2021   Dysautonomia (HCC) 04/27/2019   Fibromyalgia syndrome 04/24/2018   Tibialis posterior tendinitis, right 04/21/2018   Papanicolaou smear of cervix with low risk human  papillomavirus (HPV) DNA test positive 04/15/2018   Menorrhagia 03/13/2018   Carpal tunnel syndrome 12/31/2017   De Quervain's tenosynovitis, right 12/31/2017   Non-restorative sleep 11/07/2017   Hypersomnolence 11/07/2017   Chronic fatigue syndrome 11/06/2017   Dysphagia 08/04/2017   Chronic bilateral low back pain without sciatica 08/04/2017   RLS (restless legs syndrome) 08/04/2017   Paresthesia 08/04/2017   Raynaud's disease without gangrene 08/04/2017   Mixed irritable bowel syndrome 05/29/2017   Cubital tunnel syndrome on right 05/02/2017   Depression, recurrent (HCC) 03/26/2017   Ecchymosis 01/20/2017   Tonsil stone 08/05/2016   Abnormal weight gain 06/06/2016   Cervical paraspinal muscle spasm 03/29/2016   Decreased iron stores 02/21/2016   Myalgia and myositis 02/20/2016   Aanyah Loa, PT  Halli Equihua 02/02/2021, 10:09 AM  [facility] [address] [place] [address] [place], [place], [address] Phone: [phone]   Fax:  [phone]  Name: Teresa Garcia MRN: [id number] Date of Birth: [birth year]

## 2021-02-06 ENCOUNTER — Ambulatory Visit: Payer: No Typology Code available for payment source | Admitting: Physical Therapy

## 2021-02-06 ENCOUNTER — Other Ambulatory Visit: Payer: Self-pay

## 2021-02-06 DIAGNOSIS — M542 Cervicalgia: Secondary | ICD-10-CM

## 2021-02-06 DIAGNOSIS — R293 Abnormal posture: Secondary | ICD-10-CM

## 2021-02-06 DIAGNOSIS — M6281 Muscle weakness (generalized): Secondary | ICD-10-CM

## 2021-02-06 NOTE — Therapy (Signed)
[place], [place], [address] Phone: [phone]   Fax:  [phone]  Physical Therapy Treatment and Discharge  Patient Details  Name: Teresa Garcia MRN: [id number] Date of Birth: February 01, 1981 Referring Provider (PT): thekkekandam   Encounter Date: 02/06/2021   PT End of Session - 02/06/21 1139     Visit Number 6    Number of Visits 12    Date for PT Re-Evaluation 02/27/21    PT Start Time 1100    PT Stop Time 1138    PT Time Calculation (min) 38 min    Activity Tolerance Patient tolerated treatment well    Behavior During Therapy [facility] for tasks assessed/performed             Past Medical History:  Diagnosis Date   Chronic bilateral low back pain without sciatica 08/04/2017   Cubital tunnel syndrome on right 05/02/2017   Depression, recurrent ([place]) 03/26/2017   04/08/2017 PHQ9 = 17, GAD7 = 18 05/29/2017 PHQ 9 = 2, GAD 7 = 4   Mixed irritable bowel syndrome 05/29/2017   Raynaud's disease without gangrene 08/04/2017   RLS (restless legs syndrome) 08/04/2017    Past Surgical History:  Procedure Laterality Date   TUBAL LIGATION      There were no vitals filed for this visit.   Subjective Assessment - 02/06/21 1104     Subjective Pt states it is "much better than it was". states she had a cramp in the Left side of her neck yesterday but that "It is manageable"    Patient Stated Goals decrease frequency of pain    Currently in Pain? Yes    Pain Score 2     Pain Location Neck    Pain Orientation Left                [facility] PT Assessment - 02/06/21 0001       Assessment   Medical Diagnosis cervical paraspinal muscle spasm    Referring Provider (PT) thekkekandam      Observation/Other Assessments   Focus on Therapeutic Outcomes (FOTO)  72      AROM   Cervical - Right Rotation 75    Cervical - Left Rotation 70      Strength   Overall Strength Comments shoulder flexion 4+/5,  shoulder abduction 4/5 bilat                           [facility] Adult PT Treatment/Exercise - 02/06/21 0001       Neck Exercises: Machines for Strengthening   UBE (Upper Arm Bike) L3 x 4 min alt fwd/bkwd      Neck Exercises: Theraband   Shoulder Extension 20 reps    Shoulder Extension Limitations green TB    Rows 20 reps    Rows Limitations green TB    Other Theraband Exercises bow and arrow green TB x 10 bilat    Other Theraband Exercises serratus wall clock red TB x 10 bilat      Neck Exercises: Standing   Upper Extremity D2 Extension;10 reps    Theraband Level (UE D2) Level 2 (Red)    Other Standing Exercises reverse wall push up x 10      Neck Exercises: Prone   Other Prone Exercise T, Y x 15 each, 1 UE at a time with 1#      Manual Therapy   Soft tissue mobilization TPR subocciptal release  PT Education - 02/06/21 1139     Education Details plan for d/c                 PT Long Term Goals - 02/06/21 1112       PT LONG TERM GOAL #1   Title Pt will be independent in HEP    Status Achieved      PT LONG TERM GOAL #2   Title Pt will improve FOTO to >= 61 to demo improved functional mobility    Baseline 72    Status Achieved      PT LONG TERM GOAL #3   Title Pt will report <= 3 instances of "flare up" of neck pain per month    Status Achieved                   Plan - 02/06/21 1140     Clinical Impression Statement Pt has improved ROM and funcitonal mobility and has met all goals. Pt is ready to d/c to HEP    PT Next Visit Plan d/c    PT Home Exercise Plan [email or web address]    Consulted and Agree with Plan of Care Patient             Patient will benefit from skilled therapeutic intervention in order to improve the following deficits and impairments:     Visit Diagnosis: Abnormal posture  Muscle weakness (generalized)  Cervicalgia     Problem List Patient Active Problem List   Diagnosis Date  Noted   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   Polyarthralgia 01/10/2021   Dysautonomia ([place]) 04/27/2019   Fibromyalgia syndrome 04/24/2018   Tibialis posterior tendinitis, right 04/21/2018   Papanicolaou smear of cervix with low risk human papillomavirus (HPV) DNA test positive 04/15/2018   Menorrhagia 03/13/2018   Carpal tunnel syndrome 12/31/2017   De Quervain's tenosynovitis, right 12/31/2017   Non-restorative sleep 11/07/2017   Hypersomnolence 11/07/2017   Chronic fatigue syndrome 11/06/2017   Dysphagia 08/04/2017   Chronic bilateral low back pain without sciatica 08/04/2017   RLS (restless legs syndrome) 08/04/2017   Paresthesia 08/04/2017   Raynaud's disease without gangrene 08/04/2017   Mixed irritable bowel syndrome 05/29/2017   Cubital tunnel syndrome on right 05/02/2017   Depression, recurrent ([place]) 03/26/2017   Ecchymosis 01/20/2017   Tonsil stone 08/05/2016   Abnormal weight gain 06/06/2016   Cervical paraspinal muscle spasm 03/29/2016   Decreased iron stores 02/21/2016   Myalgia and myositis 02/20/2016   PHYSICAL THERAPY DISCHARGE SUMMARY  Visits from Start of Care: 6  Current functional level related to goals / functional outcomes: Improved functional mobility and ROM, decreased pain   Remaining deficits: See above   Education / Equipment: HEP   Patient agrees to discharge. Patient goals were met. Patient is being discharged due to meeting the stated rehab goals.  [name], PT  [facility] 02/06/2021, 11:42 AM  [facility] [place], [place], [address] Phone: [phone]   Fax:  [phone]  Name: Teresa Garcia MRN: [id number] Date of Birth: 13-Sep-1980

## 2021-02-06 NOTE — Patient Instructions (Signed)
Access Code: [email or web address] URL: [email or web address] Date: 02/06/2021 Prepared by: Reggy Eye  Exercises Doorway Pec Stretch at 60 Elevation - 1 x daily - 7 x weekly - 3 sets - 1 reps - 20-30 sec hold Standing Bilateral Low Shoulder Row with Anchored Resistance - 1 x daily - 7 x weekly - 3 sets - 10 reps Shoulder extension with resistance - Neutral - 1 x daily - 7 x weekly - 3 sets - 10 reps Standing Shoulder Single Arm PNF D2 Flexion with Resistance - 1 x daily - 7 x weekly - 2 sets - 10 reps Prone Single Arm Shoulder Y with Dumbbell - 1 x daily - 7 x weekly - 2 sets - 10 reps Prone Single Arm Shoulder Horizontal Abduction with Dumbbell - 1 x daily - 7 x weekly - 2 sets - 10 reps Wall Clock with Theraband - 1 x daily - 7 x weekly - 2 sets - 10 reps

## 2021-02-07 ENCOUNTER — Ambulatory Visit (INDEPENDENT_AMBULATORY_CARE_PROVIDER_SITE_OTHER): Payer: No Typology Code available for payment source | Admitting: Physician Assistant

## 2021-02-07 ENCOUNTER — Ambulatory Visit (INDEPENDENT_AMBULATORY_CARE_PROVIDER_SITE_OTHER): Payer: No Typology Code available for payment source | Admitting: Sports Medicine

## 2021-02-07 ENCOUNTER — Other Ambulatory Visit (HOSPITAL_BASED_OUTPATIENT_CLINIC_OR_DEPARTMENT_OTHER): Payer: Self-pay

## 2021-02-07 ENCOUNTER — Encounter: Payer: Self-pay | Admitting: Physician Assistant

## 2021-02-07 ENCOUNTER — Other Ambulatory Visit: Payer: Self-pay

## 2021-02-07 VITALS — BP 106/85 | HR 82 | Ht 64.0 in

## 2021-02-07 DIAGNOSIS — M797 Fibromyalgia: Secondary | ICD-10-CM

## 2021-02-07 DIAGNOSIS — G9332 Myalgic encephalomyelitis/chronic fatigue syndrome: Secondary | ICD-10-CM

## 2021-02-07 DIAGNOSIS — R5382 Chronic fatigue, unspecified: Secondary | ICD-10-CM

## 2021-02-07 DIAGNOSIS — M62838 Other muscle spasm: Secondary | ICD-10-CM

## 2021-02-07 DIAGNOSIS — G901 Familial dysautonomia [Riley-Day]: Secondary | ICD-10-CM | POA: Diagnosis not present

## 2021-02-07 DIAGNOSIS — G4710 Hypersomnia, unspecified: Secondary | ICD-10-CM | POA: Diagnosis not present

## 2021-02-07 MED ORDER — AMPHETAMINE-DEXTROAMPHET ER 30 MG PO CP24
30.0000 mg | ORAL_CAPSULE | Freq: Every morning | ORAL | 0 refills | Status: DC
Start: 2021-02-07 — End: 2021-05-16
  Filled 2021-02-07: qty 30, 30d supply, fill #0

## 2021-02-07 MED ORDER — AMPHETAMINE-DEXTROAMPHET ER 30 MG PO CP24
30.0000 mg | ORAL_CAPSULE | ORAL | 0 refills | Status: DC
Start: 2021-04-06 — End: 2021-05-15
  Filled 2021-04-13: qty 30, 30d supply, fill #0

## 2021-02-07 MED ORDER — AMPHETAMINE-DEXTROAMPHET ER 30 MG PO CP24
30.0000 mg | ORAL_CAPSULE | ORAL | 0 refills | Status: DC
  Filled 2021-03-15: qty 30, 30d supply, fill #0

## 2021-02-07 NOTE — Assessment & Plan Note (Signed)
Widespread aches and pains, good news is that her autoimmune work-up was negative. I do suspect there is some degree of fibromyalgia syndrome, we will continue with intermittent therapy, dry needling, and we can certainly try Savella versus Cymbalta at a follow-up visit if needed.

## 2021-02-07 NOTE — Progress Notes (Signed)
Subjective:    Patient ID: Teresa Garcia, female    DOB: Feb 03, 1981, 40 y.o.   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   Pt continues to be followed by cardiology and neurology. Adderall does help her not want to sleep all day. She certainly can tell if she does not take it. No insomnia, palpitations, headaches, vision changes, or dizziness.   She is happy right now with work and home life.   No concerns.   .. Active Ambulatory Problems    Diagnosis Date Noted   Myalgia and myositis 02/20/2016   Decreased iron stores 02/21/2016   Cervical paraspinal muscle spasm 03/29/2016   Abnormal weight gain 06/06/2016   Tonsil stone 08/05/2016   Ecchymosis 01/20/2017   Depression, recurrent (HCC) 03/26/2017   Cubital tunnel syndrome on right 05/02/2017   Mixed irritable bowel syndrome 05/29/2017   Dysphagia 08/04/2017   Chronic bilateral low back pain without sciatica 08/04/2017   RLS (restless legs syndrome) 08/04/2017   Paresthesia 08/04/2017   Raynaud's disease without gangrene 08/04/2017   Chronic fatigue syndrome 11/06/2017   Non-restorative sleep 11/07/2017   Hypersomnolence 11/07/2017   Carpal tunnel syndrome 12/31/2017   De Quervain's tenosynovitis, right 12/31/2017   Menorrhagia 03/13/2018   Papanicolaou smear of cervix with low risk human papillomavirus (HPV) DNA test positive 04/15/2018   Tibialis posterior tendinitis, right 04/21/2018   Fibromyalgia syndrome 04/24/2018   Dysautonomia (HCC) 04/27/2019   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   Resolved Ambulatory Problems    Diagnosis Date Noted   Bilateral leg pain 02/20/2016   No energy 02/20/2016   Right knee pain 02/20/2016   Inattention 07/15/2016   Anxiety 09/25/2016   Forgetfulness 09/25/2016   Mood changes 03/27/2017   Fullness of neck 08/04/2017   Numbness and tingling of both lower extremities  08/15/2017   Polyarthralgia 01/10/2021   No Additional Past Medical History    Review of Systems See HPI.     Objective:   Physical Exam Vitals reviewed.  Constitutional:      Appearance: Normal appearance.  HENT:     Head: Normocephalic.  Neck:     Vascular: No carotid bruit.  Cardiovascular:     Rate and Rhythm: Normal rate and regular rhythm.     Pulses: Normal pulses.     Heart sounds: Normal heart sounds.  Pulmonary:     Effort: Pulmonary effort is normal.     Breath sounds: Normal breath sounds.  Neurological:     General: No focal deficit present.     Mental Status: She is alert and oriented to person, place, and time.  Psychiatric:        Mood and Affect: Mood normal.          Assessment & Plan:  Marland KitchenMarland KitchenJoanell was seen today for fatigue.  Diagnoses and all orders for this visit:  Chronic fatigue syndrome -     amphetamine-dextroamphetamine (ADDERALL XR) 30 MG 24 hr capsule; Take 1 capsule (30 mg total) by mouth every morning. -     amphetamine-dextroamphetamine (ADDERALL XR) 30 MG 24 hr capsule; Take 1 capsule (30 mg total) by mouth every morning. -     amphetamine-dextroamphetamine (ADDERALL XR) 30 MG 24 hr capsule; Take 1 capsule (30 mg total) by mouth every morning.  Hypersomnolence -     amphetamine-dextroamphetamine (ADDERALL XR) 30 MG 24 hr capsule; Take 1 capsule (30 mg total) by  mouth every morning. -     amphetamine-dextroamphetamine (ADDERALL XR) 30 MG 24 hr capsule; Take 1 capsule (30 mg total) by mouth every morning. -     amphetamine-dextroamphetamine (ADDERALL XR) 30 MG 24 hr capsule; Take 1 capsule (30 mg total) by mouth every morning.  Dysautonomia (HCC)  Continue to work with neurology to treat hypersomolence and CFS.  Refilled Adderall. She is on provigil from neurology.  Labs UTD.  Follow up in 6 months.

## 2021-02-07 NOTE — Assessment & Plan Note (Signed)
Teresa Garcia returns, she is a pleasant 40 year old female, she has had neck pain, spasm intermittently over the past several years, more recently she had an increase in pain, we treated her with the typical conservative regimen including aggressive physical therapy and dry needling, the above has resulted in dramatic improvements. She was particularly interested in dry needling which we will certainly use next time if needed. X-rays at the last visit did show evidence of paraspinal spasm with loss of the normal lordosis. At this juncture she can come back to see me as needed.

## 2021-02-07 NOTE — Progress Notes (Signed)
    Procedures performed today:    None.  Independent interpretation of notes and tests performed by another provider:   None.  Brief History, Exam, Impression, and Recommendations:    Cervical paraspinal muscle spasm Teresa Garcia returns, she is a pleasant 40 year old female, she has had neck pain, spasm intermittently over the past several years, more recently she had an increase in pain, we treated her with the typical conservative regimen including aggressive physical therapy and dry needling, the above has resulted in dramatic improvements. She was particularly interested in dry needling which we will certainly use next time if needed. X-rays at the last visit did show evidence of paraspinal spasm with loss of the normal lordosis. At this juncture she can come back to see me as needed.  Fibromyalgia syndrome Widespread aches and pains, good news is that her autoimmune work-up was negative. I do suspect there is some degree of fibromyalgia syndrome, we will continue with intermittent therapy, dry needling, and we can certainly try Savella versus Cymbalta at a follow-up visit if needed.    ___________________________________________ Ihor Austin. Benjamin Stain, M.D., ABFM., CAQSM. Primary Care and Sports Medicine [facility]  Adjunct Instructor of Family Medicine  [employer]

## 2021-02-09 ENCOUNTER — Encounter: Payer: No Typology Code available for payment source | Admitting: Physical Therapy

## 2021-02-12 ENCOUNTER — Other Ambulatory Visit (HOSPITAL_BASED_OUTPATIENT_CLINIC_OR_DEPARTMENT_OTHER): Payer: Self-pay

## 2021-03-15 ENCOUNTER — Other Ambulatory Visit (HOSPITAL_BASED_OUTPATIENT_CLINIC_OR_DEPARTMENT_OTHER): Payer: Self-pay

## 2021-04-13 ENCOUNTER — Other Ambulatory Visit (HOSPITAL_BASED_OUTPATIENT_CLINIC_OR_DEPARTMENT_OTHER): Payer: Self-pay

## 2021-05-15 ENCOUNTER — Other Ambulatory Visit: Payer: Self-pay | Admitting: Physician Assistant

## 2021-05-15 ENCOUNTER — Other Ambulatory Visit (HOSPITAL_BASED_OUTPATIENT_CLINIC_OR_DEPARTMENT_OTHER): Payer: Self-pay

## 2021-05-15 DIAGNOSIS — G4710 Hypersomnia, unspecified: Secondary | ICD-10-CM

## 2021-05-15 DIAGNOSIS — G9332 Myalgic encephalomyelitis/chronic fatigue syndrome: Secondary | ICD-10-CM

## 2021-05-16 ENCOUNTER — Other Ambulatory Visit (HOSPITAL_BASED_OUTPATIENT_CLINIC_OR_DEPARTMENT_OTHER): Payer: Self-pay

## 2021-05-16 MED ORDER — AMPHETAMINE-DEXTROAMPHET ER 30 MG PO CP24
30.0000 mg | ORAL_CAPSULE | Freq: Every morning | ORAL | 0 refills | Status: DC
Start: 2021-06-15 — End: 2021-08-09
  Filled 2021-06-15: qty 30, 30d supply, fill #0

## 2021-05-16 MED ORDER — AMPHETAMINE-DEXTROAMPHET ER 30 MG PO CP24
30.0000 mg | ORAL_CAPSULE | ORAL | 0 refills | Status: DC
Start: 2021-07-16 — End: 2021-08-09
  Filled 2021-07-17: qty 30, 30d supply, fill #0

## 2021-05-16 MED ORDER — AMPHETAMINE-DEXTROAMPHET ER 30 MG PO CP24
30.0000 mg | ORAL_CAPSULE | ORAL | 0 refills | Status: DC
  Filled 2021-05-16: qty 30, 30d supply, fill #0

## 2021-06-15 ENCOUNTER — Other Ambulatory Visit (HOSPITAL_BASED_OUTPATIENT_CLINIC_OR_DEPARTMENT_OTHER): Payer: Self-pay

## 2021-07-17 ENCOUNTER — Other Ambulatory Visit (HOSPITAL_BASED_OUTPATIENT_CLINIC_OR_DEPARTMENT_OTHER): Payer: Self-pay

## 2021-08-10 ENCOUNTER — Ambulatory Visit: Payer: 59 | Admitting: Physician Assistant

## 2021-08-10 ENCOUNTER — Other Ambulatory Visit (HOSPITAL_BASED_OUTPATIENT_CLINIC_OR_DEPARTMENT_OTHER): Payer: Self-pay

## 2021-08-10 ENCOUNTER — Encounter: Payer: Self-pay | Admitting: Physician Assistant

## 2021-08-10 ENCOUNTER — Other Ambulatory Visit: Payer: Self-pay

## 2021-08-10 VITALS — BP 117/79 | HR 92 | Ht 64.0 in | Wt 152.0 lb

## 2021-08-10 DIAGNOSIS — M357 Hypermobility syndrome: Secondary | ICD-10-CM | POA: Diagnosis not present

## 2021-08-10 DIAGNOSIS — G9332 Myalgic encephalomyelitis/chronic fatigue syndrome: Secondary | ICD-10-CM

## 2021-08-10 DIAGNOSIS — G4710 Hypersomnia, unspecified: Secondary | ICD-10-CM | POA: Diagnosis not present

## 2021-08-10 MED ORDER — AMPHETAMINE-DEXTROAMPHET ER 30 MG PO CP24
30.0000 mg | ORAL_CAPSULE | ORAL | 0 refills | Status: DC
  Filled 2021-10-19: qty 30, 30d supply, fill #0

## 2021-08-10 MED ORDER — AMPHETAMINE-DEXTROAMPHET ER 30 MG PO CP24
30.0000 mg | ORAL_CAPSULE | Freq: Every morning | ORAL | 0 refills | Status: DC
Start: 2021-08-10 — End: 2021-11-20
  Filled 2021-08-10 – 2021-08-15 (×2): qty 30, 30d supply, fill #0

## 2021-08-10 MED ORDER — AMPHETAMINE-DEXTROAMPHET ER 30 MG PO CP24
30.0000 mg | ORAL_CAPSULE | ORAL | 0 refills | Status: DC
  Filled 2021-09-20: qty 30, 30d supply, fill #0

## 2021-08-10 NOTE — Patient Instructions (Signed)
LDN therapy for EDS/autoimmune disorder  Naltrexone tablets What is this medication? NALTREXONE (nal TREX one) helps you to remain free of your dependence on opiate drugs or alcohol. It blocks the 'high' that these substances can give you. This medicine is combined with counseling and support groups. This medicine may be used for other purposes; ask your health care provider or pharmacist if you have questions. COMMON BRAND NAME(S): Depade, ReVia What should I tell my care team before I take this medication? They need to know if you have any of these conditions: if you have used drugs or alcohol within 7 to 10 days kidney disease liver disease, including hepatitis an unusual or allergic reaction to naltrexone, other medicines, foods, dyes, or preservatives pregnant or trying to get pregnant breast-feeding How should I use this medication? Take this medicine by mouth with a full glass of water. Follow the directions on the prescription label. Do not take this medicine within 7 to 10 days of taking any opioid drugs. Take your medicine at regular intervals. Do not take your medicine more often than directed. Do not stop taking except on your doctor's advice. Talk to your pediatrician regarding the use of this medicine in children. Special care may be needed. Overdosage: If you think you have taken too much of this medicine contact a poison control center or emergency room at once. NOTE: This medicine is only for you. Do not share this medicine with others. What if I miss a dose? If you miss a dose and remember on the same day, take the missed dose. If you do not remember until the next day, ask your doctor or health care professional about rescheduling your doses. Do not take double or extra doses. What may interact with this medication? Do not take this medicine with any of the following medications: any prescription or street opioid drug like codiene, heroin, methadone This medicine may also  interact with the following medications: disulfiram thioridazine This list may not describe all possible interactions. Give your health care provider a list of all the medicines, herbs, non-prescription drugs, or dietary supplements you use. Also tell them if you smoke, drink alcohol, or use illegal drugs. Some items may interact with your medicine. What should I watch for while using this medication? Your condition will be monitored carefully while you are receiving this medicine. Visit your doctor or health care professional regularly. For this medicine to be most effective you should attend any counseling or support groups that your doctor or health care professional recommends. Do not try to overcome the effects of the medicine by taking large amounts of narcotics or by drinking large amounts of alcohol. This can cause severe problems including death. Also, you may be more sensitive to lower doses of narcotics after you stop taking this medicine. If you are going to have surgery, tell your doctor or health care professional that you are taking this medicine. Do not treat yourself for coughs, colds, pain, or diarrhea. Ask your doctor or health care professional for advice. Some of the ingredients may interact with this medicine and cause side effects. Wear a medical ID bracelet or chain, and carry a card that describes your disease and details of your medicine and dosage times. You may get drowsy or dizzy. Do not drive, use machinery, or do anything that needs mental alertness until you know how this medicine affects you. Do not stand or sit up quickly, especially if you are an older patient. This reduces the risk  of dizzy or fainting spells. Alcohol may interfere with the effect of this medicine. Avoid alcoholic drinks. What side effects may I notice from receiving this medication? Side effects that you should report to your doctor or health care professional as soon as possible: allergic reactions  like skin rash, itching or hives, swelling of the face, lips, or tongue breathing problems changes in vision, hearing confusion dark urine depressed mood diarrhea fast or irregular heart beat hallucination, loss of contact with reality light-colored stools right upper belly pain suicidal thoughts or other mood changes unusually weak or tired vomiting yellowing of the eyes or skin Side effects that usually do not require medical attention (report to your doctor or health care professional if they continue or are bothersome): aches, pains change in sex drive or performance feeling anxious headache loss of appetite, nausea runny nose, sinus problems, sneezing stomach pain trouble sleeping This list may not describe all possible side effects. Call your doctor for medical advice about side effects. You may report side effects to FDA at 1-800-FDA-1088. Where should I keep my medication? Keep out of the reach of children. Store at room temperature between 20 and 25 degrees C (68 and 77 degrees F). Throw away any unused medicine after the expiration date. NOTE: This sheet is a summary. It may not cover all possible information. If you have questions about this medicine, talk to your doctor, pharmacist, or health care provider.  2022 Elsevier/Gold Standard (2012-03-28 00:00:00)

## 2021-08-10 NOTE — Progress Notes (Signed)
Subjective:    Patient ID: Teresa Garcia, female    DOB: 12-26-1980, 41 y.o.   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 Dysautonomia, Idiopathic hypersomnolence who presents to the clinic for medication refills.   She is doing ok on medications. She continues to struggle with energy, chronic pain, intermittent dizziness. 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   .. Active Ambulatory Problems    Diagnosis Date Noted   Myalgia and myositis 02/20/2016   Decreased iron stores 02/21/2016   Cervical paraspinal muscle spasm 03/29/2016   Abnormal weight gain 06/06/2016   Tonsil stone 08/05/2016   Ecchymosis 01/20/2017   Depression, recurrent ([place]) 03/26/2017   Cubital tunnel syndrome on right 05/02/2017   Mixed irritable bowel syndrome 05/29/2017   Dysphagia 08/04/2017   Chronic bilateral low back pain without sciatica 08/04/2017   RLS (restless legs syndrome) 08/04/2017   Paresthesia 08/04/2017   Raynaud's disease without gangrene 08/04/2017   Chronic fatigue syndrome 11/06/2017   Non-restorative sleep 11/07/2017   Hypersomnolence 11/07/2017   Carpal tunnel syndrome 12/31/2017   De Quervain's tenosynovitis, right 12/31/2017   Menorrhagia 03/13/2018   Papanicolaou smear of cervix with low risk human papillomavirus (HPV) DNA test positive 04/15/2018   Tibialis posterior tendinitis, right 04/21/2018   Fibromyalgia syndrome 04/24/2018   Dysautonomia ([place]) 04/27/2019   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     Objective:   Physical Exam Vitals reviewed.  Constitutional:      Appearance: Normal appearance.  HENT:     Head: Normocephalic.  Cardiovascular:     Rate and Rhythm: Normal rate and regular rhythm.     Pulses: Normal pulses.     Heart sounds: Normal heart sounds.  Pulmonary:     Effort: Pulmonary effort is normal.     Breath sounds: Normal breath sounds.  Musculoskeletal:     Right lower leg: No edema.     Left lower leg: No edema.  Neurological:     General: No focal deficit present.     Mental Status: She is alert and oriented to person, place, and time.  Psychiatric:        Mood and Affect: Mood normal.   .. Depression screen [facility] 2/9 12/17/2019 03/26/2017 09/25/2016  Decreased Interest 0 2 0  Down, Depressed, Hopeless 0 2 0  PHQ - 2 Score 0 4 0  Altered sleeping 1 1 0  Tired, decreased energy 2 3 3   Change in appetite 0 1 0  Feeling bad or failure about yourself  0 1 0  Trouble concentrating 0 3 1  Moving slowly or fidgety/restless 0 1 0  Suicidal thoughts 0 0 0  PHQ-9 Score 3 14 4   Difficult doing work/chores Not difficult at all - -         Assessment & Plan:  Marland KitchenMarland KitchenGurjot was seen today for adhd.  Diagnoses and all orders for this  visit:  Hypersomnolence -     amphetamine-dextroamphetamine (ADDERALL XR) 30 MG 24 hr capsule; Take 1 capsule (30 mg total) by mouth every morning. -     amphetamine-dextroamphetamine (ADDERALL XR) 30 MG 24 hr capsule; Take 1 capsule (30 mg total) by mouth every morning. -     amphetamine-dextroamphetamine (ADDERALL XR) 30 MG 24 hr capsule; Take 1 capsule (30 mg total) by mouth every morning.  Chronic fatigue syndrome -     amphetamine-dextroamphetamine (ADDERALL XR) 30 MG 24 hr capsule; Take 1 capsule (30 mg total) by mouth every morning. -     amphetamine-dextroamphetamine (ADDERALL XR) 30 MG 24 hr capsule; Take 1 capsule (30 mg total) by mouth every  morning. -     amphetamine-dextroamphetamine (ADDERALL XR) 30 MG 24 hr capsule; Take 1 capsule (30 mg total) by mouth every morning.  Benign joint hypermobility syndrome   Refilled adderall for 3 months.  Teresa Garcia for next 3 month refills.  Follow up in 6 months.   Discussed trial of LDN therapy. Pt will research and see if she would like to try this.

## 2021-08-15 ENCOUNTER — Other Ambulatory Visit (HOSPITAL_BASED_OUTPATIENT_CLINIC_OR_DEPARTMENT_OTHER): Payer: Self-pay

## 2021-09-20 ENCOUNTER — Other Ambulatory Visit (HOSPITAL_BASED_OUTPATIENT_CLINIC_OR_DEPARTMENT_OTHER): Payer: Self-pay

## 2021-10-19 ENCOUNTER — Other Ambulatory Visit (HOSPITAL_BASED_OUTPATIENT_CLINIC_OR_DEPARTMENT_OTHER): Payer: Self-pay

## 2021-11-19 ENCOUNTER — Other Ambulatory Visit (HOSPITAL_BASED_OUTPATIENT_CLINIC_OR_DEPARTMENT_OTHER): Payer: Self-pay

## 2021-11-19 ENCOUNTER — Other Ambulatory Visit: Payer: Self-pay | Admitting: Physician Assistant

## 2021-11-19 DIAGNOSIS — G4710 Hypersomnia, unspecified: Secondary | ICD-10-CM

## 2021-11-19 DIAGNOSIS — G9332 Myalgic encephalomyelitis/chronic fatigue syndrome: Secondary | ICD-10-CM

## 2021-11-20 ENCOUNTER — Other Ambulatory Visit (HOSPITAL_BASED_OUTPATIENT_CLINIC_OR_DEPARTMENT_OTHER): Payer: Self-pay

## 2021-11-20 MED ORDER — AMPHETAMINE-DEXTROAMPHET ER 30 MG PO CP24
30.0000 mg | ORAL_CAPSULE | Freq: Every morning | ORAL | 0 refills | Status: DC
  Filled 2021-12-20: qty 30, 30d supply, fill #0

## 2021-11-20 MED ORDER — AMPHETAMINE-DEXTROAMPHET ER 30 MG PO CP24
30.0000 mg | ORAL_CAPSULE | ORAL | 0 refills | Status: DC
  Filled 2022-01-21: qty 30, 30d supply, fill #0

## 2021-11-20 MED ORDER — AMPHETAMINE-DEXTROAMPHET ER 30 MG PO CP24
30.0000 mg | ORAL_CAPSULE | ORAL | 0 refills | Status: DC
  Filled 2021-11-20: qty 30, 30d supply, fill #0

## 2021-12-20 ENCOUNTER — Other Ambulatory Visit (HOSPITAL_BASED_OUTPATIENT_CLINIC_OR_DEPARTMENT_OTHER): Payer: Self-pay

## 2022-01-17 ENCOUNTER — Other Ambulatory Visit (HOSPITAL_BASED_OUTPATIENT_CLINIC_OR_DEPARTMENT_OTHER): Payer: Self-pay

## 2022-01-18 ENCOUNTER — Other Ambulatory Visit (HOSPITAL_BASED_OUTPATIENT_CLINIC_OR_DEPARTMENT_OTHER): Payer: Self-pay

## 2022-01-21 ENCOUNTER — Other Ambulatory Visit (HOSPITAL_BASED_OUTPATIENT_CLINIC_OR_DEPARTMENT_OTHER): Payer: Self-pay

## 2022-01-30 ENCOUNTER — Encounter: Payer: Self-pay | Admitting: Physician Assistant

## 2022-01-30 ENCOUNTER — Other Ambulatory Visit (HOSPITAL_BASED_OUTPATIENT_CLINIC_OR_DEPARTMENT_OTHER): Payer: Self-pay

## 2022-01-30 ENCOUNTER — Ambulatory Visit (INDEPENDENT_AMBULATORY_CARE_PROVIDER_SITE_OTHER): Payer: 59 | Admitting: Physician Assistant

## 2022-01-30 VITALS — BP 118/68 | HR 116 | Ht 64.0 in | Wt 154.0 lb

## 2022-01-30 DIAGNOSIS — G4710 Hypersomnia, unspecified: Secondary | ICD-10-CM | POA: Diagnosis not present

## 2022-01-30 DIAGNOSIS — Z0000 Encounter for general adult medical examination without abnormal findings: Secondary | ICD-10-CM

## 2022-01-30 DIAGNOSIS — G9332 Myalgic encephalomyelitis/chronic fatigue syndrome: Secondary | ICD-10-CM

## 2022-01-30 DIAGNOSIS — Z1329 Encounter for screening for other suspected endocrine disorder: Secondary | ICD-10-CM

## 2022-01-30 DIAGNOSIS — Z13220 Encounter for screening for lipoid disorders: Secondary | ICD-10-CM | POA: Diagnosis not present

## 2022-01-30 DIAGNOSIS — Z131 Encounter for screening for diabetes mellitus: Secondary | ICD-10-CM | POA: Diagnosis not present

## 2022-01-30 MED ORDER — AMPHETAMINE-DEXTROAMPHET ER 30 MG PO CP24
30.0000 mg | ORAL_CAPSULE | ORAL | 0 refills | Status: DC
  Filled 2022-01-30 – 2022-02-21 (×2): qty 30, 30d supply, fill #0

## 2022-01-30 MED ORDER — AMPHETAMINE-DEXTROAMPHET ER 30 MG PO CP24
30.0000 mg | ORAL_CAPSULE | ORAL | 0 refills | Status: DC
  Filled 2022-03-22: qty 30, 30d supply, fill #0

## 2022-01-30 MED ORDER — AMPHETAMINE-DEXTROAMPHET ER 30 MG PO CP24
30.0000 mg | ORAL_CAPSULE | Freq: Every morning | ORAL | 0 refills | Status: DC
  Filled 2022-04-22: qty 30, 30d supply, fill #0

## 2022-01-30 NOTE — Progress Notes (Signed)
Complete physical exam  Patient: Teresa Garcia   DOB: 1981-05-25   41 y.o. Female  MRN: [id number]  Subjective:    Chief Complaint  Patient presents with   Annual Exam    Teresa Garcia is a 41 y.o. female who presents today for a complete physical exam. She reports consuming a general diet. The patient does not participate in regular exercise at present. She generally feels fairly well. She reports sleeping fairly well. She does not have additional problems to discuss today.    Most recent fall risk assessment:    01/30/2022    8:31 AM  Fall Risk   Falls in the past year? 0  Number falls in past yr: 0  Injury with Fall? 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   Dysautonomia ([facility]) 04/27/2019   Fibromyalgia syndrome 04/24/2018   Tibialis posterior tendinitis, right 04/21/2018   Papanicolaou smear of cervix with low risk human papillomavirus (HPV) DNA test positive 04/15/2018   Menorrhagia 03/13/2018   Carpal tunnel syndrome 12/31/2017   De Quervain's tenosynovitis, right 12/31/2017   Non-restorative sleep 11/07/2017   Hypersomnolence 11/07/2017   Chronic fatigue syndrome 11/06/2017   Dysphagia 08/04/2017   Chronic bilateral low back pain without sciatica 08/04/2017   RLS (restless legs syndrome) 08/04/2017   Paresthesia 08/04/2017   Raynaud's disease without gangrene 08/04/2017   Mixed irritable bowel syndrome 05/29/2017   Cubital tunnel syndrome on right 05/02/2017   Depression, recurrent ([facility]) 03/26/2017   Ecchymosis 01/20/2017   Tonsil stone 08/05/2016   Abnormal weight gain 06/06/2016    Cervical paraspinal muscle spasm 03/29/2016   Decreased iron stores 02/21/2016   Myalgia and myositis 02/20/2016   Acne vulgaris 11/21/2015   Past Medical History:  Diagnosis Date   Chronic bilateral low back pain without sciatica 08/04/2017   Cubital tunnel syndrome on right 05/02/2017   Depression, recurrent ([facility]) 03/26/2017   04/08/2017 PHQ9 = 17, GAD7 = 18 05/29/2017 PHQ 9 = 2, GAD 7 = 4   Mixed irritable bowel syndrome 05/29/2017   Raynaud's disease without gangrene 08/04/2017   RLS (restless legs syndrome) 08/04/2017   Family History  Problem Relation Age of Onset   Diabetes Paternal Grandmother    Diabetes Maternal Grandmother    Allergies  Allergen Reactions   Cymbalta [Duloxetine Hcl]     Made exhaustion worse.    Trintellix [Vortioxetine]     Increasing IBS symptoms.    Wellbutrin [Bupropion] Other (See Comments)    Worsening of symptoms      Patient Care Team: Nolene Ebbs as PCP - General (Family Medicine)   Outpatient Medications Prior to Visit  Medication Sig   atenolol (TENORMIN) 25 MG tablet Take 1 tablet (25 mg total) by mouth daily.   cyclobenzaprine (FLEXERIL) 10 MG tablet Take 1/2 to 1 tablet by mouth at bedtime. May increase gradually to one tab three times daily.   meloxicam (MOBIC) 15 MG tablet One tab PO qAM with breakfast for 2  weeks, then daily prn pain.   modafinil (PROVIGIL) 100 MG tablet Take 1 tablet (100 mg total) by mouth 2 (two) times daily as needed (take at 10 AM and second dose at 1-2 PM no later than 3 PM.).   [DISCONTINUED] amphetamine-dextroamphetamine (ADDERALL XR) 30 MG 24 hr capsule Take 1 capsule (30 mg total) by mouth every morning.   [DISCONTINUED] amphetamine-dextroamphetamine (ADDERALL XR) 30 MG 24 hr capsule Take 1 capsule (30 mg total) by mouth every morning.   [DISCONTINUED] amphetamine-dextroamphetamine (ADDERALL XR) 30 MG 24 hr capsule Take 1 capsule (30 mg total) by mouth every morning.   No  facility-administered medications prior to visit.    Review of Systems  All other systems reviewed and are negative.         Objective:     BP 118/68   Pulse (!) 116   Ht 5\' 4"  (1.626 m)   Wt 154 lb (69.9 kg)   SpO2 100%   BMI 26.43 kg/m  BP Readings from Last 3 Encounters:  01/30/22 118/68  08/10/21 117/79  02/07/21 106/85   Wt Readings from Last 3 Encounters:  01/30/22 154 lb (69.9 kg)  08/10/21 152 lb (68.9 kg)  07/07/20 142 lb (64.4 kg)      Physical Exam  BP 118/68   Pulse (!) 116   Ht 5\' 4"  (1.626 m)   Wt 154 lb (69.9 kg)   SpO2 100%   BMI 26.43 kg/m   General Appearance:    Alert, cooperative, no distress, appears stated age  Head:    Normocephalic, without obvious abnormality, atraumatic  Eyes:    PERRL, conjunctiva/corneas clear, EOM's intact, fundi    benign, both eyes  Ears:    Normal TM's and external ear canals, both ears  Nose:   Nares normal, septum midline, mucosa normal, no drainage    or sinus tenderness  Throat:   Lips, mucosa, and tongue normal; teeth and gums normal  Neck:   Supple, symmetrical, trachea midline, no adenopathy;    thyroid:  no enlargement/tenderness/nodules; no carotid   bruit or JVD  Back:     Symmetric, no curvature, ROM normal, no CVA tenderness  Lungs:     Clear to auscultation bilaterally, respirations unlabored  Chest Wall:    No tenderness or deformity   Heart:    Regular rate and rhythm, S1 and S2 normal, no murmur, rub   or gallop     Abdomen:     Soft, non-tender, bowel sounds active all four quadrants,    no masses, no organomegaly        Extremities:   Extremities normal, atraumatic, no cyanosis or edema  Pulses:   2+ and symmetric all extremities  Skin:   Skin color, texture, turgor normal, no rashes or lesions  Lymph nodes:   Cervical, supraclavicular, and axillary nodes normal  Neurologic:   CNII-XII intact, normal strength, sensation and reflexes    throughout      Assessment & Plan:    Routine  Health Maintenance and Physical Exam  Immunization History  Administered Date(s) Administered   Influenza,inj,Quad PF,6+ Mos 03/29/2019   Influenza-Unspecified 03/15/2017, 03/31/2018, 04/05/2020, 04/14/2021   Tdap 12/17/2019    Health Maintenance  Topic Date Due   PAP SMEAR-Modifier  03/13/2021   INFLUENZA VACCINE  01/15/2022   TETANUS/TDAP  12/16/2029   Hepatitis C Screening  Completed   HIV Screening  Completed   HPV VACCINES  Aged Out    Discussed health benefits of  physical activity, and encouraged her to engage in regular exercise appropriate for her age and condition.  Marland KitchenSharyn Lull was seen today for annual exam.  Diagnoses and all orders for this visit:  Routine physical examination -     TSH -     Lipid Panel w/reflex Direct LDL -     COMPLETE METABOLIC PANEL WITH GFR -     CBC with Differential/Platelet  Lipid screening -     Lipid Panel w/reflex Direct LDL  Thyroid disorder screen -     TSH  Diabetes mellitus screening -     COMPLETE METABOLIC PANEL WITH GFR  Chronic fatigue syndrome -     amphetamine-dextroamphetamine (ADDERALL XR) 30 MG 24 hr capsule; Take 1 capsule (30 mg total) by mouth every morning. -     amphetamine-dextroamphetamine (ADDERALL XR) 30 MG 24 hr capsule; Take 1 capsule (30 mg total) by mouth every morning. -     amphetamine-dextroamphetamine (ADDERALL XR) 30 MG 24 hr capsule; Take 1 capsule (30 mg total) by mouth every morning.  Hypersomnolence -     amphetamine-dextroamphetamine (ADDERALL XR) 30 MG 24 hr capsule; Take 1 capsule (30 mg total) by mouth every morning. -     amphetamine-dextroamphetamine (ADDERALL XR) 30 MG 24 hr capsule; Take 1 capsule (30 mg total) by mouth every morning. -     amphetamine-dextroamphetamine (ADDERALL XR) 30 MG 24 hr capsule; Take 1 capsule (30 mg total) by mouth every morning.   .. Discussed 150 minutes of exercise a week.  Encouraged vitamin D 1000 units and Calcium 1300mg  or 4 servings of dairy a day.   Fasting labs ordered PHQ/GAD stable, no concerns Refilled medications Vitals look great Pap and mammogram to be done at GYN next week Encouraged flu shot Return in about 1 year (around 01/31/2023), or if symptoms worsen or fail to improve, for CPE.     Iran Planas, PA-C

## 2022-01-30 NOTE — Patient Instructions (Signed)

## 2022-01-31 LAB — COMPLETE METABOLIC PANEL WITH GFR
AG Ratio: 1.3 (calc) (ref 1.0–2.5)
ALT: 13 U/L (ref 6–29)
AST: 13 U/L (ref 10–30)
Albumin: 4.2 g/dL (ref 3.6–5.1)
Alkaline phosphatase (APISO): 51 U/L (ref 31–125)
BUN/Creatinine Ratio: 15 (calc) (ref 6–22)
BUN: 15 mg/dL (ref 7–25)
CO2: 23 mmol/L (ref 20–32)
Calcium: 9 mg/dL (ref 8.6–10.2)
Chloride: 106 mmol/L (ref 98–110)
Creat: 1.03 mg/dL — ABNORMAL HIGH (ref 0.50–0.99)
Globulin: 3.3 g/dL (calc) (ref 1.9–3.7)
Glucose, Bld: 97 mg/dL (ref 65–99)
Potassium: 4.5 mmol/L (ref 3.5–5.3)
Sodium: 138 mmol/L (ref 135–146)
Total Bilirubin: 0.4 mg/dL (ref 0.2–1.2)
Total Protein: 7.5 g/dL (ref 6.1–8.1)
eGFR: 70 mL/min/{1.73_m2} (ref >=60)

## 2022-01-31 LAB — LIPID PANEL W/REFLEX DIRECT LDL
Cholesterol: 205 mg/dL — ABNORMAL HIGH (ref <200)
HDL: 81 mg/dL (ref >=50)
LDL Cholesterol (Calc): 110 mg/dL (calc) — ABNORMAL HIGH
Non-HDL Cholesterol (Calc): 124 mg/dL (calc) (ref <130)
Total CHOL/HDL Ratio: 2.5 (calc) (ref <5.0)
Triglycerides: 56 mg/dL (ref <150)

## 2022-01-31 LAB — CBC WITH DIFFERENTIAL/PLATELET
Absolute Monocytes: 436 cells/uL (ref 200–950)
Basophils Absolute: 74 cells/uL (ref 0–200)
Basophils Relative: 1.1 %
Eosinophils Absolute: 295 cells/uL (ref 15–500)
Eosinophils Relative: 4.4 %
HCT: 42.1 % (ref 35.0–45.0)
Hemoglobin: 13.6 g/dL (ref 11.7–15.5)
Lymphs Abs: 2084 cells/uL (ref 850–3900)
MCH: 28.9 pg (ref 27.0–33.0)
MCHC: 32.3 g/dL (ref 32.0–36.0)
MCV: 89.4 fL (ref 80.0–100.0)
MPV: 10.3 fL (ref 7.5–12.5)
Monocytes Relative: 6.5 %
Neutro Abs: 3812 cells/uL (ref 1500–7800)
Neutrophils Relative %: 56.9 %
Platelets: 309 10*3/uL (ref 140–400)
RBC: 4.71 10*6/uL (ref 3.80–5.10)
RDW: 13 % (ref 11.0–15.0)
Total Lymphocyte: 31.1 %
WBC: 6.7 10*3/uL (ref 3.8–10.8)

## 2022-01-31 LAB — TSH: TSH: 2.94 mIU/L

## 2022-01-31 NOTE — Progress Notes (Signed)
Vonda,   HDL is GREAT! LDL up a little from last year but still looks good.  Thyroid looks great.  Hemoglobin and WBC are normal.  Liver and glucose normal.  Kidney function did drop some from last year.  Avoid and NSAIds and stay hydrated check in 4-6 weeks.

## 2022-02-01 ENCOUNTER — Other Ambulatory Visit: Payer: Self-pay | Admitting: Neurology

## 2022-02-01 DIAGNOSIS — N289 Disorder of kidney and ureter, unspecified: Secondary | ICD-10-CM

## 2022-02-05 ENCOUNTER — Encounter: Payer: Self-pay | Admitting: Certified Nurse Midwife

## 2022-02-05 ENCOUNTER — Ambulatory Visit (INDEPENDENT_AMBULATORY_CARE_PROVIDER_SITE_OTHER): Payer: 59 | Admitting: Certified Nurse Midwife

## 2022-02-05 ENCOUNTER — Other Ambulatory Visit (HOSPITAL_COMMUNITY)
Admission: RE | Admit: 2022-02-05 | Discharge: 2022-02-05 | Disposition: A | Discharge status: Home / Self Care | Payer: 59 | Source: Ambulatory Visit | Attending: Certified Nurse Midwife | Admitting: Certified Nurse Midwife

## 2022-02-05 VITALS — BP 133/91 | HR 114 | Ht 66.0 in | Wt 156.0 lb

## 2022-02-05 DIAGNOSIS — Z01419 Encounter for gynecological examination (general) (routine) without abnormal findings: Secondary | ICD-10-CM | POA: Diagnosis not present

## 2022-02-05 DIAGNOSIS — Z1231 Encounter for screening mammogram for malignant neoplasm of breast: Secondary | ICD-10-CM

## 2022-02-05 DIAGNOSIS — N943 Premenstrual tension syndrome: Secondary | ICD-10-CM | POA: Diagnosis not present

## 2022-02-06 ENCOUNTER — Encounter: Payer: Self-pay | Admitting: Certified Nurse Midwife

## 2022-02-06 ENCOUNTER — Other Ambulatory Visit (HOSPITAL_BASED_OUTPATIENT_CLINIC_OR_DEPARTMENT_OTHER): Payer: Self-pay

## 2022-02-06 MED ORDER — NORETHIN-ETH ESTRAD-FE BIPHAS 1 MG-10 MCG / 10 MCG PO TABS
1.0000 | ORAL_TABLET | Freq: Every day | ORAL | 3 refills | Status: DC
Start: 2022-02-06 — End: 2022-06-19
  Filled 2022-02-06: qty 84, 84d supply, fill #0
  Filled 2022-04-22: qty 84, 84d supply, fill #1

## 2022-02-06 NOTE — Progress Notes (Signed)
Gynecology Annual Exam   History of Present Illness: Teresa Garcia is a 41 y.o. single female presenting for an annual exam. Reports worsening MSK pain 1 week prior to each menses. Reports hx of MSK hypermobility. Pain is located mid to upper back, shoulders, and neck. Pain prohibits her from getting out of bed at times. She uses NSAIDS when at its worst. She has used dry needling in the past with good results. She is sexually active but doesn't have much desire d/t pain and fatigue. She denies dyspareunia. She does perform self breast exams. There is no notable family history of breast or ovarian cancer in her family.   Past Medical History:  Past Medical History:  Diagnosis Date   Chronic bilateral low back pain without sciatica 08/04/2017   Cubital tunnel syndrome on right 05/02/2017   Depression, recurrent (HCC) 03/26/2017   04/08/2017 PHQ9 = 17, GAD7 = 18 05/29/2017 PHQ 9 = 2, GAD 7 = 4   Mixed irritable bowel syndrome 05/29/2017   Raynaud's disease without gangrene 08/04/2017   RLS (restless legs syndrome) 08/04/2017   Past Surgical History:  Past Surgical History:  Procedure Laterality Date   TUBAL LIGATION     Gynecologic History:  LMP: Patient's last menstrual period was 01/08/2022 (approximate). Average Interval: regular Heavy Menses: no Clots: no Intermenstrual Bleeding: no Postcoital Bleeding: occasionally Dysmenorrhea: no Contraception: tubal ligation Last Pap: completed on 03/13/18 ; result was: NIL and HR HPV+  Mammogram: never   Obstetric History: [id number]  Family History:  Family History  Problem Relation Age of Onset   Diabetes Paternal Grandmother    Diabetes Maternal Grandmother     Social History:  Social History   Socioeconomic History   Marital status: Significant Other    Spouse name: Not on file   Number of children: Not on file   Years of education: Not on file   Highest education level: Not on file  Occupational History   Not on file  Tobacco  Use   Smoking status: Never   Smokeless tobacco: Never  Vaping Use   Vaping Use: Every day   Devices: does daily  Substance and Sexual Activity   Alcohol use: Yes    Comment: Socially   Drug use: No   Sexual activity: Yes    Birth control/protection: Surgical  Other Topics Concern   Not on file  Social History Narrative   Not on file   Social Determinants of Health   Financial Resource Strain: Not on file  Food Insecurity: Not on file  Transportation Needs: Not on file  Physical Activity: Not on file  Stress: Not on file  Social Connections: Not on file  Intimate Partner Violence: Not on file    Allergies:  Allergies  Allergen Reactions   Cymbalta [Duloxetine Hcl]     Made exhaustion worse.    Trintellix [Vortioxetine]     Increasing IBS symptoms.    Wellbutrin [Bupropion] Other (See Comments)    Worsening of symptoms    Medications: Prior to Admission medications   Medication Sig Start Date End Date Taking? Authorizing Provider  amphetamine-dextroamphetamine (ADDERALL XR) 30 MG 24 hr capsule Take 1 capsule (30 mg total) by mouth every morning. 01/30/22  Yes Breeback, Jade L, PA-C  amphetamine-dextroamphetamine (ADDERALL XR) 30 MG 24 hr capsule Take 1 capsule (30 mg total) by mouth every morning. 03/01/22  Yes Breeback, Jade L, PA-C  amphetamine-dextroamphetamine (ADDERALL XR) 30 MG 24 hr capsule Take 1 capsule (30 mg total) by  mouth every morning. 03/30/22  Yes Breeback, Jade L, PA-C  atenolol (TENORMIN) 25 MG tablet Take 1 tablet (25 mg total) by mouth daily. 08/30/19  Yes Duke Salvia, MD  cyclobenzaprine (FLEXERIL) 10 MG tablet Take 1/2 to 1 tablet by mouth at bedtime. May increase gradually to one tab three times daily. 01/10/21  Yes Monica Becton, MD  meloxicam (MOBIC) 15 MG tablet One tab PO qAM with breakfast for 2 weeks, then daily prn pain. 12/04/18  Yes Monica Becton, MD  modafinil (PROVIGIL) 100 MG tablet Take 1 tablet (100 mg total) by  mouth 2 (two) times daily as needed (take at 10 AM and second dose at 1-2 PM no later than 3 PM.). 06/24/19  Yes Huston Foley, MD    Review of Systems: negative except noted in HPI  Physical Exam Vitals: BP (!) 133/91   Pulse (!) 114   Ht 5\' 6"  (1.676 m)   Wt 156 lb (70.8 kg)   LMP 01/08/2022 (Approximate)   BMI 25.18 kg/m  General: NAD HEENT: normocephalic, atraumatic Thyroid: no enlargement, no palpable nodules Pulmonary: Normal rate and effort, CTAB Cardiovascular: RRR Breast: Breast symmetrical, no tenderness, no palpable nodules or masses, no skin or nipple retraction present, no nipple discharge. No axillary or supraclavicular lymphadenopathy. Abdomen: soft, non-tender, non-distended. No hepatomegaly, splenomegaly or masses palpable. No evidence of hernia  Genitourinary:  External: Normal external female genitalia. Normal urethral meatus  Vagina: Normal vaginal mucosa, no evidence of prolapse   Cervix: Grossly normal in appearance, no bleeding  Uterus: Non-enlarged, mobile, normal contour. No CMT  Adnexa: non-enlarged, no masses  Rectal: deferred Extremities: no edema, erythema, or tenderness Neurologic: Grossly intact Psychiatric: mood appropriate, affect full Female chaperone present for pelvic and breast portions of the physical exam  No results found for this or any previous visit (from the past 24 hour(s)).  Assessment:  1. Well woman exam with routine gynecological exam   2. Encounter for screening mammogram for malignant neoplasm of breast   3. Premenstrual tension syndrome    Plan: Papsmear today Screening mammogram ordered Offered a trial of low dose continuous OCP to help MSK pain, pt accepts Rx Loloestrin Follow up with GYN in 3 mos Follow up with PCP as planned  01/10/2022, CNM 02/05/22

## 2022-02-08 ENCOUNTER — Ambulatory Visit: Payer: 59 | Admitting: Physician Assistant

## 2022-02-12 LAB — CYTOLOGY - PAP
Chlamydia: NEGATIVE
Comment: NEGATIVE
Comment: NEGATIVE
Comment: NORMAL
Diagnosis: HIGH — AB
High risk HPV: POSITIVE — AB
Neisseria Gonorrhea: NEGATIVE

## 2022-02-13 ENCOUNTER — Encounter: Payer: Self-pay | Admitting: Certified Nurse Midwife

## 2022-02-14 ENCOUNTER — Ambulatory Visit (INDEPENDENT_AMBULATORY_CARE_PROVIDER_SITE_OTHER): Payer: 59

## 2022-02-14 DIAGNOSIS — Z1231 Encounter for screening mammogram for malignant neoplasm of breast: Secondary | ICD-10-CM | POA: Diagnosis not present

## 2022-02-14 DIAGNOSIS — Z01419 Encounter for gynecological examination (general) (routine) without abnormal findings: Secondary | ICD-10-CM

## 2022-02-19 ENCOUNTER — Other Ambulatory Visit: Payer: Self-pay | Admitting: Certified Nurse Midwife

## 2022-02-19 DIAGNOSIS — R928 Other abnormal and inconclusive findings on diagnostic imaging of breast: Secondary | ICD-10-CM

## 2022-02-21 ENCOUNTER — Other Ambulatory Visit (HOSPITAL_BASED_OUTPATIENT_CLINIC_OR_DEPARTMENT_OTHER): Payer: Self-pay

## 2022-03-22 ENCOUNTER — Other Ambulatory Visit (HOSPITAL_BASED_OUTPATIENT_CLINIC_OR_DEPARTMENT_OTHER): Payer: Self-pay

## 2022-03-22 MED ORDER — FLUARIX QUADRIVALENT 0.5 ML IM SUSY
PREFILLED_SYRINGE | INTRAMUSCULAR | 0 refills | Status: DC
  Filled 2022-03-22: qty 0.5, 1d supply, fill #0

## 2022-04-04 ENCOUNTER — Other Ambulatory Visit (HOSPITAL_COMMUNITY)
Admission: RE | Admit: 2022-04-04 | Discharge: 2022-04-04 | Disposition: A | Discharge status: Home / Self Care | Payer: 59 | Source: Ambulatory Visit | Attending: Obstetrics and Gynecology | Admitting: Obstetrics and Gynecology

## 2022-04-04 ENCOUNTER — Ambulatory Visit: Payer: 59 | Admitting: Obstetrics and Gynecology

## 2022-04-04 ENCOUNTER — Encounter: Payer: Self-pay | Admitting: Obstetrics and Gynecology

## 2022-04-04 VITALS — BP 127/87 | HR 111 | Resp 16 | Ht 66.0 in | Wt 156.0 lb

## 2022-04-04 DIAGNOSIS — R87613 High grade squamous intraepithelial lesion on cytologic smear of cervix (HGSIL): Secondary | ICD-10-CM | POA: Diagnosis not present

## 2022-04-04 DIAGNOSIS — N72 Inflammatory disease of cervix uteri: Secondary | ICD-10-CM | POA: Diagnosis not present

## 2022-04-04 DIAGNOSIS — Z01812 Encounter for preprocedural laboratory examination: Secondary | ICD-10-CM

## 2022-04-04 DIAGNOSIS — D060 Carcinoma in situ of endocervix: Secondary | ICD-10-CM | POA: Diagnosis not present

## 2022-04-04 DIAGNOSIS — B977 Papillomavirus as the cause of diseases classified elsewhere: Secondary | ICD-10-CM | POA: Diagnosis not present

## 2022-04-04 LAB — POCT URINE PREGNANCY: Preg Test, Ur: NEGATIVE

## 2022-04-04 NOTE — Progress Notes (Signed)
Patient with HGSIL +HRHPV on 01/2022 pap smear here for colposcopy  Patient given informed consent, signed copy in the chart, time out was performed.  Placed in lithotomy position. Cervix viewed with speculum and colposcope after application of acetic acid.   Colposcopy adequate?  yes Acetowhite lesions?8 and 10-12 o'clock Punctation?no Mosaicism?  Around 10-12 o'clock Abnormal vasculature?  no Biopsies?8, 10 and 12 o'clock ECC?yes  COMMENTS: Patient was given post procedure instructions.  She will return in 2 weeks for results.  Mora Bellman, MD

## 2022-04-08 LAB — SURGICAL PATHOLOGY

## 2022-04-22 ENCOUNTER — Other Ambulatory Visit (HOSPITAL_BASED_OUTPATIENT_CLINIC_OR_DEPARTMENT_OTHER): Payer: Self-pay

## 2022-05-02 ENCOUNTER — Encounter: Payer: 59 | Admitting: Obstetrics and Gynecology

## 2022-05-14 NOTE — Progress Notes (Unsigned)
   GYNECOLOGY OFFICE PROCEDURE NOTE  Teresa Garcia is a 41 y.o. [phone] here for LEEP. No GYN concerns. Pap smear and colposcopy history reviewed.    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   Other pap history: 02/2018: Pap wnl, HPV pos 04/2016: Pap wnl, HPV pos  Risks, benefits, alternatives, and limitations of procedure explained to patient, including pain, bleeding, infection, failure to remove abnormal tissue and failure to cure dysplasia, need for repeat procedures, damage to pelvic organs, cervical incompetence.  Role of HPV,cervical dysplasia and need for close followup was empasized. Informed written consent was obtained. All questions were answered. Time out performed. Urine pregnancy test was {Blank single:19197::"Negative","Not indicated"}.  ??Procedure: The patient was placed in lithotomy position and the bivalved coated speculum was placed in the patient's vagina. A grounding pad placed on the patient. Lugol's solution was applied to the cervix and areas of decreased uptake were noted around the transformation zone.   Local anesthesia was administered via an intracervical block using 10 ml of 2% Lidocaine with epinephrine. The suction was turned on and the {Desc; extra large/large/medium/small/extra small:30013} 1X Fisher Cone Biopsy Excisor on 11 Watts of blended current was used to excise the entire transformation zone. Excellent hemostasis was achieved using roller ball coagulation set at 60 Watts coagulation current. Monsel's solution was then applied and the speculum was removed from the vagina. Specimens were sent to pathology.  ?The patient tolerated the procedure well. Post-operative instructions given to patient, including instruction to seek medical attention for persistent bright red bleeding, fever, abdominal/pelvic pain, dysuria, nausea or vomiting. She was also told about the possibility of having copious yellow to black tinged  discharge for weeks. She was counseled to avoid anything in the vagina (sex/douching/tampons) for 3 weeks. She has a 4 week post-operative check to assess wound healing, review results and discuss further management.    Milas Hock, MD, FACOG Obstetrician & Gynecologist, [facility] for [facility], [facility]

## 2022-05-16 ENCOUNTER — Other Ambulatory Visit (HOSPITAL_COMMUNITY)
Admission: RE | Admit: 2022-05-16 | Discharge: 2022-05-16 | Disposition: A | Discharge status: Home / Self Care | Payer: 59 | Source: Ambulatory Visit | Attending: Obstetrics and Gynecology | Admitting: Obstetrics and Gynecology

## 2022-05-16 ENCOUNTER — Ambulatory Visit (INDEPENDENT_AMBULATORY_CARE_PROVIDER_SITE_OTHER): Payer: 59 | Admitting: Obstetrics and Gynecology

## 2022-05-16 ENCOUNTER — Encounter: Payer: Self-pay | Admitting: Obstetrics and Gynecology

## 2022-05-16 VITALS — BP 134/95 | HR 123 | Resp 16 | Ht 66.0 in | Wt 155.0 lb

## 2022-05-16 DIAGNOSIS — R87810 Cervical high risk human papillomavirus (HPV) DNA test positive: Secondary | ICD-10-CM

## 2022-05-16 DIAGNOSIS — D069 Carcinoma in situ of cervix, unspecified: Secondary | ICD-10-CM | POA: Diagnosis not present

## 2022-05-16 DIAGNOSIS — Z23 Encounter for immunization: Secondary | ICD-10-CM

## 2022-05-16 DIAGNOSIS — R87613 High grade squamous intraepithelial lesion on cytologic smear of cervix (HGSIL): Secondary | ICD-10-CM | POA: Diagnosis not present

## 2022-05-16 DIAGNOSIS — Z01812 Encounter for preprocedural laboratory examination: Secondary | ICD-10-CM

## 2022-05-16 LAB — POCT URINE PREGNANCY: Preg Test, Ur: NEGATIVE

## 2022-05-16 NOTE — Addendum Note (Signed)
Addended by: Granville Lewis on: 05/16/2022 03:30 PM   Modules accepted: Orders

## 2022-05-20 LAB — SURGICAL PATHOLOGY

## 2022-05-22 ENCOUNTER — Other Ambulatory Visit: Payer: Self-pay | Admitting: Physician Assistant

## 2022-05-22 ENCOUNTER — Other Ambulatory Visit (HOSPITAL_BASED_OUTPATIENT_CLINIC_OR_DEPARTMENT_OTHER): Payer: Self-pay

## 2022-05-22 DIAGNOSIS — G9332 Myalgic encephalomyelitis/chronic fatigue syndrome: Secondary | ICD-10-CM

## 2022-05-22 DIAGNOSIS — G4710 Hypersomnia, unspecified: Secondary | ICD-10-CM

## 2022-05-22 MED ORDER — AMPHETAMINE-DEXTROAMPHET ER 30 MG PO CP24
30.0000 mg | ORAL_CAPSULE | Freq: Every morning | ORAL | 0 refills | Status: DC
  Filled 2022-05-22: qty 30, 30d supply, fill #0

## 2022-05-22 NOTE — Telephone Encounter (Signed)
Pt is due for f/u with pcp for refills on adderall. Please have her call to schedule an in person or virtual appt.

## 2022-05-22 NOTE — Telephone Encounter (Signed)
Pt is scheduled for 06/19/2022 @10 :30. Pt wanted to know if you could still send a Rx in to hold her over until her appointment. She stated that she can't function without it. She will sleep 12 hours if she doesn't have her medicine.

## 2022-05-22 NOTE — Telephone Encounter (Signed)
Medication refill to be sent.

## 2022-05-27 ENCOUNTER — Other Ambulatory Visit (HOSPITAL_BASED_OUTPATIENT_CLINIC_OR_DEPARTMENT_OTHER): Payer: Self-pay

## 2022-06-18 NOTE — Progress Notes (Unsigned)
GYNECOLOGY OFFICE VISIT NOTE  History:   Teresa Garcia is a 42 y.o. [phone] here today for postop check s/p LEEP procedure on 05/16/22.   04/2016: Pap wnl, HPV pos 02/2018: Pap wnl, HPV pos 04/2016: Pap wnl, HPV pos 03/2022: HSIL, HPV pos > Colpo ECC and Bx w/CIN3 04/2022: LEEP with pathology showing CIN3 approaching endocervical margin less than 0.46mm.    She does note significant BTB on Loloestrin. She has had it help with her neck and back spasms. Had tubal for birth control. She stopped the OCP and her bleeding stopped but then her spasms were severe. She would like to resume something similar.    Past Medical History:  Diagnosis Date   Chronic bilateral low back pain without sciatica 08/04/2017   Cubital tunnel syndrome on right 05/02/2017   Depression, recurrent ([place]) 03/26/2017   04/08/2017 PHQ9 = 17, GAD7 = 18 05/29/2017 PHQ 9 = 2, GAD 7 = 4   Mixed irritable bowel syndrome 05/29/2017   Raynaud's disease without gangrene 08/04/2017   RLS (restless legs syndrome) 08/04/2017    Past Surgical History:  Procedure Laterality Date   TUBAL LIGATION      The following portions of the patient's history were reviewed and updated as appropriate: allergies, current medications, past family history, past medical history, past social history, past surgical history and problem list.   Review of Systems:  Pertinent items noted in HPI and remainder of comprehensive ROS otherwise negative.  Physical Exam:  BP 131/82   Pulse (!) 114   Ht 5\' 6"  (1.676 m)   Wt 158 lb (71.7 kg)   BMI 25.50 kg/m  CONSTITUTIONAL: Well-developed, well-nourished female in no acute distress.  HEENT:  Normocephalic, atraumatic. External right and left ear normal. No scleral icterus.  NECK: Normal range of motion, supple, no masses noted on observation SKIN: No rash noted. Not diaphoretic. No erythema. No pallor. MUSCULOSKELETAL: Normal range of motion. No edema noted. NEUROLOGIC: Alert and oriented to  person, place, and time. Normal muscle tone coordination. No cranial nerve deficit noted. PSYCHIATRIC: Normal mood and affect. Normal behavior. Normal judgment and thought content.  CARDIOVASCULAR: Normal heart rate noted RESPIRATORY: Effort and breath sounds normal, no problems with respiration noted ABDOMEN: No masses noted. No other overt distention noted.    PELVIC: Normal appearing external genitalia; normal urethral meatus; normal appearing vaginal mucosa and cervix.  No abnormal discharge noted.  Normal uterine size, no other palpable masses, no uterine or adnexal tenderness. Performed in the presence of a chaperone  Labs and Imaging No results found for this or any previous visit (from the past 168 hour(s)). No results found.  Assessment and Plan:   1. Abnormal cervical Papanicolaou smear, unspecified abnormal pap finding Plan is for pap/ECC in 4 months although margins technically negative.  Gardasil #1 given on 05/16/22.   2. Postop check Well healed - no restrictions  3. BTB on OCP Will do Junel 1/20. If this doesn't work for BTB, we discussed Junel 1.5/30 and if that doesn't work then we would try Tilia. I would recommend ideally giving each dose 2 months to work.  -     norethindrone-ethinyl estradiol-FE (JUNEL FE 1/20) 1-20 MG-MCG tablet; Take 1 tablet by mouth daily.    Routine preventative health maintenance measures emphasized. Please refer to After Visit Summary for other counseling recommendations.   No follow-ups on file.  Radene Gunning, MD, [place] for [facility], [place]

## 2022-06-19 ENCOUNTER — Other Ambulatory Visit (HOSPITAL_BASED_OUTPATIENT_CLINIC_OR_DEPARTMENT_OTHER): Payer: Self-pay

## 2022-06-19 ENCOUNTER — Ambulatory Visit (INDEPENDENT_AMBULATORY_CARE_PROVIDER_SITE_OTHER): Payer: Commercial Managed Care - PPO | Admitting: Obstetrics and Gynecology

## 2022-06-19 ENCOUNTER — Encounter: Payer: Self-pay | Admitting: Physician Assistant

## 2022-06-19 ENCOUNTER — Ambulatory Visit (INDEPENDENT_AMBULATORY_CARE_PROVIDER_SITE_OTHER): Payer: Commercial Managed Care - PPO | Admitting: Physician Assistant

## 2022-06-19 ENCOUNTER — Encounter: Payer: Self-pay | Admitting: Obstetrics and Gynecology

## 2022-06-19 VITALS — BP 131/82 | HR 114 | Ht 66.0 in | Wt 158.0 lb

## 2022-06-19 DIAGNOSIS — G901 Familial dysautonomia [Riley-Day]: Secondary | ICD-10-CM

## 2022-06-19 DIAGNOSIS — G9332 Myalgic encephalomyelitis/chronic fatigue syndrome: Secondary | ICD-10-CM

## 2022-06-19 DIAGNOSIS — Z09 Encounter for follow-up examination after completed treatment for conditions other than malignant neoplasm: Secondary | ICD-10-CM

## 2022-06-19 DIAGNOSIS — M357 Hypermobility syndrome: Secondary | ICD-10-CM

## 2022-06-19 DIAGNOSIS — R87619 Unspecified abnormal cytological findings in specimens from cervix uteri: Secondary | ICD-10-CM | POA: Diagnosis not present

## 2022-06-19 DIAGNOSIS — G4710 Hypersomnia, unspecified: Secondary | ICD-10-CM | POA: Diagnosis not present

## 2022-06-19 DIAGNOSIS — N921 Excessive and frequent menstruation with irregular cycle: Secondary | ICD-10-CM | POA: Diagnosis not present

## 2022-06-19 MED ORDER — NORETHIN ACE-ETH ESTRAD-FE 1-20 MG-MCG PO TABS
1.0000 | ORAL_TABLET | Freq: Every day | ORAL | 11 refills | Status: DC
  Filled 2022-06-19: qty 28, 28d supply, fill #0
  Filled 2022-07-26: qty 84, 84d supply, fill #1
  Filled 2022-09-27: qty 84, 84d supply, fill #2
  Filled 2022-10-11: qty 28, 28d supply, fill #2
  Filled 2022-10-31: qty 28, 28d supply, fill #3

## 2022-06-19 MED ORDER — AMPHETAMINE-DEXTROAMPHET ER 30 MG PO CP24
30.0000 mg | ORAL_CAPSULE | Freq: Every morning | ORAL | 0 refills | Status: DC
  Filled 2022-06-19 – 2022-06-24 (×2): qty 30, 30d supply, fill #0

## 2022-06-19 MED ORDER — AMPHETAMINE-DEXTROAMPHET ER 30 MG PO CP24
30.0000 mg | ORAL_CAPSULE | ORAL | 0 refills | Status: DC
  Filled 2022-08-27: qty 30, 30d supply, fill #0

## 2022-06-19 MED ORDER — AMPHETAMINE-DEXTROAMPHET ER 30 MG PO CP24
30.0000 mg | ORAL_CAPSULE | ORAL | 0 refills | Status: DC
  Filled 2022-07-26: qty 30, 30d supply, fill #0

## 2022-06-19 NOTE — Progress Notes (Signed)
Pt had a heavy period that lasted 3 weeks- stopped taking OCP

## 2022-06-19 NOTE — Patient Instructions (Addendum)
Mydayis switch to consider switch for adderall

## 2022-06-19 NOTE — Progress Notes (Signed)
Established Patient Office Visit  Subjective   Patient ID: Teresa Garcia, female    DOB: 1980/08/25  Age: 42 y.o. 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   Pt is doing ok. She struggles with fatigue daily. Adderall just makes it manageable. She is interested in testing for EDS and LDN. She has done some research.   Denies any SI/Hc. Work is going well.   .. Active Ambulatory Problems    Diagnosis Date Noted   Myalgia and myositis 02/20/2016   Decreased iron stores 02/21/2016   Cervical paraspinal muscle spasm 03/29/2016   Abnormal weight gain 06/06/2016   Tonsil stone 08/05/2016   Ecchymosis 01/20/2017   Depression, recurrent ([place]) 03/26/2017   Cubital tunnel syndrome on right 05/02/2017   Mixed irritable bowel syndrome 05/29/2017   Dysphagia 08/04/2017   Chronic bilateral low back pain without sciatica 08/04/2017   RLS (restless legs syndrome) 08/04/2017   Paresthesia 08/04/2017   Raynaud's disease without gangrene 08/04/2017   Chronic fatigue syndrome 11/06/2017   Non-restorative sleep 11/07/2017   Hypersomnolence 11/07/2017   Carpal tunnel syndrome 12/31/2017   De Quervain's tenosynovitis, right 12/31/2017   Menorrhagia 03/13/2018   Abnormal Pap smear of cervix 04/15/2018   Tibialis posterior tendinitis, right 04/21/2018   Fibromyalgia syndrome 04/24/2018   Dysautonomia ([place]) 04/27/2019   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    Objective:     BP 131/82   Pulse (!) 114   Ht 5\' 6"  (1.676 m)   Wt 158 lb (71.7 kg)   SpO2 99%   BMI 25.50 kg/m  BP Readings from Last 3 Encounters:  06/19/22 131/82  06/19/22 131/82  05/16/22 (!) 134/95   Wt Readings from Last 3 Encounters:  06/19/22 158 lb (71.7 kg)  06/19/22 158 lb (71.7 kg)  05/16/22 155 lb (70.3 kg)   ..    06/21/2022    7:46 AM 01/30/2022    8:31 AM 12/17/2019    4:03 PM 03/26/2017   11:29 AM 09/25/2016    1:57 PM  Depression screen PHQ 2/9  Decreased Interest 0 0 0 2 0  Down, Depressed, Hopeless 0 0 0 2 0  PHQ - 2 Score 0 0 0 4 0  Altered sleeping 3  1 1  0  Tired, decreased energy 3  2 3 3   Change in appetite 0  0 1 0  Feeling bad or failure about yourself  0  0 1 0  Trouble concentrating   0 3 1  Moving slowly or fidgety/restless 0  0 1 0  Suicidal thoughts 0  0 0 0  PHQ-9 Score 6  3 14 4   Difficult doing work/chores Somewhat difficult  Not difficult at all     ..    06/21/2022    7:46 AM 12/17/2019  4:04 PM 09/25/2016    1:57 PM  GAD 7 : Generalized Anxiety Score  Nervous, Anxious, on Edge 0 1 2  Control/stop worrying 0 0 0  Worry too much - different things 0 0 0  Trouble relaxing 0 1 1  Restless 0 0 1  Easily annoyed or irritable 0 0 1  Afraid - awful might happen 0 0 0  Total GAD 7 Score 0 2 5  Anxiety Difficulty Not difficult at all Not difficult at all         Physical Exam Constitutional:      Appearance: Normal appearance.  HENT:     Head: Normocephalic.  Cardiovascular:     Rate and Rhythm: Normal rate and regular rhythm.  Pulmonary:     Effort: Pulmonary effort is normal.     Breath sounds: Normal breath sounds.  Neurological:     General: No focal deficit present.     Mental Status: She is alert and oriented to person, place, and time.  Psychiatric:        Mood and Affect: Mood normal.        Assessment  & Plan:  Marland KitchenMarland KitchenAdler was seen today for adhd and follow-up.  Diagnoses and all orders for this visit:  Chronic fatigue syndrome -     amphetamine-dextroamphetamine (ADDERALL XR) 30 MG 24 hr capsule; Take 1 capsule (30 mg total) by mouth every morning. -     amphetamine-dextroamphetamine (ADDERALL XR) 30 MG 24 hr capsule; Take 1 capsule (30 mg total) by mouth every morning. -     amphetamine-dextroamphetamine (ADDERALL XR) 30 MG 24 hr capsule; Take 1 capsule (30 mg total) by mouth every morning. -     AMBULATORY NON FORMULARY MEDICATION; Naltrexone .5mg  at bedtime daily.  Hypersomnolence -     amphetamine-dextroamphetamine (ADDERALL XR) 30 MG 24 hr capsule; Take 1 capsule (30 mg total) by mouth every morning. -     amphetamine-dextroamphetamine (ADDERALL XR) 30 MG 24 hr capsule; Take 1 capsule (30 mg total) by mouth every morning. -     amphetamine-dextroamphetamine (ADDERALL XR) 30 MG 24 hr capsule; Take 1 capsule (30 mg total) by mouth every morning. -     AMBULATORY NON FORMULARY MEDICATION; Naltrexone .5mg  at bedtime daily.  Dysautonomia ([place]) -     AMBULATORY NON FORMULARY MEDICATION; Naltrexone .5mg  at bedtime daily.  Benign joint hypermobility syndrome -     AMBULATORY NON FORMULARY MEDICATION; Naltrexone .5mg  at bedtime daily.    Discussed maybe changing adderall to mydayis Pt will consider  Discussed genetic testing for EDS Will get kit sent in the mail She has risk factors for EDS  Discussed LDN for symptoms Start .5mg    Follow up in 3 months  Iran Planas, PA-C

## 2022-06-21 ENCOUNTER — Encounter: Payer: Self-pay | Admitting: Physician Assistant

## 2022-06-21 MED ORDER — AMBULATORY NON FORMULARY MEDICATION: 0 refills | Status: DC

## 2022-06-21 NOTE — Telephone Encounter (Signed)
Prescription faxed to [facility] with confirmation received.

## 2022-06-24 ENCOUNTER — Other Ambulatory Visit (HOSPITAL_BASED_OUTPATIENT_CLINIC_OR_DEPARTMENT_OTHER): Payer: Self-pay

## 2022-06-27 ENCOUNTER — Telehealth: Payer: Self-pay | Admitting: Neurology

## 2022-06-27 NOTE — Telephone Encounter (Signed)
EDS lab ordered through [facility].   ORDER #[id number] Teresa Garcia Date of birth: Mar 10, 1981 ORDER DATE 06-27-2022 ACCOUNT [id number] ORDERING PROVIDER Beatrice Lecher Placed by Beatrice Lecher Additional Reporting Providers (ARP) Lindel Marcell [facility] Order Activity NEW UPDATES Sample Kit sent 06/27/2022, 9:04 AM 06/27/2022, 9:03 AM No Previous Updates Testing Sample Kit Order Date 06-27-2022 Sample Type Buccal Kit Quantity 1 Would you like to request another sample collection kit, or have one sent directly to your patient? C127 - Heritable Disorders of Connective Tissue Order Date 06-27-2022 Lab Status Ordered Estimated Report Date 4 weeks from test start TAT Actions - Prior Authorization - Last Updated - Order Summary IMPORTANT ORDER DOCUMENTS What is included in this file?  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 Yes Attaching Clinical Information I will attach clinical notes Clinical Diagnosis Chronic Fatigue, Hypermobility syndrome, dysautomnia Clinical notes - Has the patient had previous genetic testing? No Clinical symptoms No Age of first symptom onset Unknown Sample Details Blood Transfusion Involving White Blood Cells Within the Last 4 Weeks No Sample Obtained No GeneDx to Send the Patient a Sample Kit Yes Sample type Buccal Kit Sample Kit Requested Buccal Kit

## 2022-07-16 ENCOUNTER — Ambulatory Visit (INDEPENDENT_AMBULATORY_CARE_PROVIDER_SITE_OTHER): Payer: Commercial Managed Care - PPO | Admitting: *Deleted

## 2022-07-16 DIAGNOSIS — Z23 Encounter for immunization: Secondary | ICD-10-CM | POA: Diagnosis not present

## 2022-07-16 NOTE — Progress Notes (Signed)
2nd Gardasil vaccine given Lt deltoid IM

## 2022-07-26 ENCOUNTER — Other Ambulatory Visit: Payer: Self-pay

## 2022-07-26 ENCOUNTER — Other Ambulatory Visit (HOSPITAL_BASED_OUTPATIENT_CLINIC_OR_DEPARTMENT_OTHER): Payer: Self-pay

## 2022-08-06 ENCOUNTER — Telehealth: Payer: Self-pay | Admitting: *Deleted

## 2022-08-06 NOTE — Telephone Encounter (Signed)
Left patient a message to call and schedule 4 month pap and ECC with Dr. Mikel Cella or other MD for around 09/14/2022 or after.

## 2022-08-27 ENCOUNTER — Other Ambulatory Visit (HOSPITAL_BASED_OUTPATIENT_CLINIC_OR_DEPARTMENT_OTHER): Payer: Self-pay

## 2022-09-05 ENCOUNTER — Telehealth: Payer: Self-pay | Admitting: Neurology

## 2022-09-05 NOTE — Telephone Encounter (Signed)
GeneDX testing completed. 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 Patient made aware. She did receive a copy of this information. She will think about Genetic counseling and call us back if she wants a referral.

## 2022-09-27 ENCOUNTER — Other Ambulatory Visit: Payer: Self-pay

## 2022-09-27 ENCOUNTER — Other Ambulatory Visit (HOSPITAL_BASED_OUTPATIENT_CLINIC_OR_DEPARTMENT_OTHER): Payer: Self-pay

## 2022-09-27 ENCOUNTER — Other Ambulatory Visit: Payer: Self-pay | Admitting: Physician Assistant

## 2022-09-27 DIAGNOSIS — G4710 Hypersomnia, unspecified: Secondary | ICD-10-CM

## 2022-09-27 DIAGNOSIS — G9332 Myalgic encephalomyelitis/chronic fatigue syndrome: Secondary | ICD-10-CM

## 2022-09-30 ENCOUNTER — Other Ambulatory Visit (HOSPITAL_BASED_OUTPATIENT_CLINIC_OR_DEPARTMENT_OTHER): Payer: Self-pay

## 2022-09-30 MED ORDER — AMPHETAMINE-DEXTROAMPHET ER 30 MG PO CP24: 30.0000 mg | ORAL_CAPSULE | ORAL | 0 refills | Status: DC

## 2022-09-30 MED ORDER — AMPHETAMINE-DEXTROAMPHET ER 30 MG PO CP24
30.0000 mg | ORAL_CAPSULE | ORAL | 0 refills | Status: DC
Start: 2022-10-30 — End: 2023-02-04
  Filled 2022-10-31: qty 30, 30d supply, fill #0

## 2022-09-30 MED ORDER — AMPHETAMINE-DEXTROAMPHET ER 30 MG PO CP24
30.0000 mg | ORAL_CAPSULE | Freq: Every morning | ORAL | 0 refills | Status: DC
Start: 2022-09-30 — End: 2022-11-22
  Filled 2022-09-30: qty 30, 30d supply, fill #0

## 2022-09-30 NOTE — Telephone Encounter (Signed)
Last OV: 06/19/22 Next OV: No appt scheduled Last RF: 08/16/22

## 2022-10-01 ENCOUNTER — Other Ambulatory Visit: Payer: Self-pay

## 2022-10-11 ENCOUNTER — Other Ambulatory Visit: Payer: Self-pay

## 2022-10-31 ENCOUNTER — Other Ambulatory Visit (HOSPITAL_BASED_OUTPATIENT_CLINIC_OR_DEPARTMENT_OTHER): Payer: Self-pay

## 2022-10-31 ENCOUNTER — Other Ambulatory Visit: Payer: Self-pay

## 2022-11-01 ENCOUNTER — Encounter: Payer: Self-pay | Admitting: Obstetrics and Gynecology

## 2022-11-04 ENCOUNTER — Other Ambulatory Visit: Payer: Self-pay

## 2022-11-04 ENCOUNTER — Other Ambulatory Visit (HOSPITAL_BASED_OUTPATIENT_CLINIC_OR_DEPARTMENT_OTHER): Payer: Self-pay

## 2022-11-04 DIAGNOSIS — Z09 Encounter for follow-up examination after completed treatment for conditions other than malignant neoplasm: Secondary | ICD-10-CM

## 2022-11-04 MED ORDER — NORETHIN ACE-ETH ESTRAD-FE 1-20 MG-MCG PO TABS
1.0000 | ORAL_TABLET | Freq: Every day | ORAL | 8 refills | Status: DC
Start: 2022-11-04 — End: 2023-01-22
  Filled 2022-11-04: qty 28, 28d supply, fill #0
  Filled 2022-11-22: qty 28, 21d supply, fill #1
  Filled 2022-11-26: qty 28, 28d supply, fill #1
  Filled 2022-12-16 – 2022-12-18 (×2): qty 28, 28d supply, fill #2

## 2022-11-14 ENCOUNTER — Ambulatory Visit (INDEPENDENT_AMBULATORY_CARE_PROVIDER_SITE_OTHER): Payer: Commercial Managed Care - PPO

## 2022-11-14 DIAGNOSIS — Z23 Encounter for immunization: Secondary | ICD-10-CM

## 2022-11-14 NOTE — Progress Notes (Signed)
Pt presents for 3rd Gardasil vaccine, given in left deltoid IM and tolerated well.

## 2022-11-22 ENCOUNTER — Other Ambulatory Visit (HOSPITAL_BASED_OUTPATIENT_CLINIC_OR_DEPARTMENT_OTHER): Payer: Self-pay

## 2022-11-22 ENCOUNTER — Other Ambulatory Visit: Payer: Self-pay | Admitting: Physician Assistant

## 2022-11-22 DIAGNOSIS — G9332 Myalgic encephalomyelitis/chronic fatigue syndrome: Secondary | ICD-10-CM

## 2022-11-22 DIAGNOSIS — G4710 Hypersomnia, unspecified: Secondary | ICD-10-CM

## 2022-11-22 MED ORDER — AMPHETAMINE-DEXTROAMPHET ER 30 MG PO CP24
30.0000 mg | ORAL_CAPSULE | Freq: Every morning | ORAL | 0 refills | Status: DC
Start: 2022-11-22 — End: 2023-01-17
  Filled 2022-11-22 – 2022-12-16 (×3): qty 30, 30d supply, fill #0

## 2022-11-25 ENCOUNTER — Other Ambulatory Visit (HOSPITAL_BASED_OUTPATIENT_CLINIC_OR_DEPARTMENT_OTHER): Payer: Self-pay

## 2022-11-26 ENCOUNTER — Other Ambulatory Visit: Payer: Self-pay

## 2022-11-28 ENCOUNTER — Other Ambulatory Visit (HOSPITAL_BASED_OUTPATIENT_CLINIC_OR_DEPARTMENT_OTHER): Payer: Self-pay

## 2022-11-29 ENCOUNTER — Other Ambulatory Visit (HOSPITAL_BASED_OUTPATIENT_CLINIC_OR_DEPARTMENT_OTHER): Payer: Self-pay

## 2022-12-09 ENCOUNTER — Other Ambulatory Visit (HOSPITAL_BASED_OUTPATIENT_CLINIC_OR_DEPARTMENT_OTHER): Payer: Self-pay

## 2022-12-16 ENCOUNTER — Other Ambulatory Visit (HOSPITAL_BASED_OUTPATIENT_CLINIC_OR_DEPARTMENT_OTHER): Payer: Self-pay

## 2022-12-16 ENCOUNTER — Other Ambulatory Visit: Payer: Self-pay

## 2022-12-25 ENCOUNTER — Other Ambulatory Visit: Payer: Self-pay | Admitting: Oncology

## 2022-12-25 DIAGNOSIS — Z006 Encounter for examination for normal comparison and control in clinical research program: Secondary | ICD-10-CM

## 2022-12-27 ENCOUNTER — Other Ambulatory Visit: Payer: Self-pay

## 2023-01-17 ENCOUNTER — Other Ambulatory Visit (HOSPITAL_BASED_OUTPATIENT_CLINIC_OR_DEPARTMENT_OTHER): Payer: Self-pay

## 2023-01-17 ENCOUNTER — Other Ambulatory Visit: Payer: Self-pay | Admitting: Physician Assistant

## 2023-01-17 ENCOUNTER — Other Ambulatory Visit: Payer: Self-pay

## 2023-01-17 DIAGNOSIS — G4710 Hypersomnia, unspecified: Secondary | ICD-10-CM

## 2023-01-17 DIAGNOSIS — G9332 Myalgic encephalomyelitis/chronic fatigue syndrome: Secondary | ICD-10-CM

## 2023-01-17 MED ORDER — AMPHETAMINE-DEXTROAMPHET ER 30 MG PO CP24
30.0000 mg | ORAL_CAPSULE | Freq: Every morning | ORAL | 0 refills | Status: DC
Start: 2023-01-17 — End: 2023-02-04
  Filled 2023-01-17: qty 30, 30d supply, fill #0

## 2023-01-22 ENCOUNTER — Ambulatory Visit: Payer: Commercial Managed Care - PPO | Admitting: Obstetrics and Gynecology

## 2023-01-22 ENCOUNTER — Other Ambulatory Visit (HOSPITAL_BASED_OUTPATIENT_CLINIC_OR_DEPARTMENT_OTHER): Payer: Self-pay

## 2023-01-22 ENCOUNTER — Other Ambulatory Visit (HOSPITAL_COMMUNITY)
Admission: RE | Admit: 2023-01-22 | Discharge: 2023-01-22 | Disposition: A | Discharge status: Home / Self Care | Payer: Commercial Managed Care - PPO | Source: Ambulatory Visit | Attending: Obstetrics and Gynecology | Admitting: Obstetrics and Gynecology

## 2023-01-22 ENCOUNTER — Encounter: Payer: Self-pay | Admitting: Obstetrics and Gynecology

## 2023-01-22 VITALS — BP 144/93 | HR 128 | Ht 66.0 in | Wt 153.0 lb

## 2023-01-22 DIAGNOSIS — R87613 High grade squamous intraepithelial lesion on cytologic smear of cervix (HGSIL): Secondary | ICD-10-CM | POA: Diagnosis not present

## 2023-01-22 DIAGNOSIS — R928 Other abnormal and inconclusive findings on diagnostic imaging of breast: Secondary | ICD-10-CM

## 2023-01-22 DIAGNOSIS — N921 Excessive and frequent menstruation with irregular cycle: Secondary | ICD-10-CM | POA: Diagnosis not present

## 2023-01-22 DIAGNOSIS — N882 Stricture and stenosis of cervix uteri: Secondary | ICD-10-CM | POA: Diagnosis not present

## 2023-01-22 MED ORDER — SLYND 4 MG PO TABS: 1.0000 | ORAL_TABLET | Freq: Every day | ORAL | 3 refills | Status: DC

## 2023-01-22 MED ORDER — NORETHINDRONE ACET-ETHINYL EST 1.5-30 MG-MCG PO TABS
1.0000 | ORAL_TABLET | Freq: Every day | ORAL | 3 refills | Status: DC
  Filled 2023-01-22: qty 90, 90d supply, fill #0

## 2023-01-22 NOTE — Progress Notes (Signed)
   RETURN GYNECOLOGY VISIT  Subjective:  Teresa Garcia is a 42 y.o. G3P3003 on COCs presenting for breakthrough bleeding  Has been on COCs and struggling with BTB. Last saw Dr. Para March 1/3 and was increased to [facility] 1/20. Had a really painful period in February where she felt like she was having contractions and was getting gushes of blood in between hours without bleeding. Then had frequent bleeding March to July.   Of note, pt w/ recent LEEP w plan for ECC/pap in April that pt did not schedule due to concerns about bleeding. Also had BI RADS 0 mammogram on 02/14/22 but she has not completed the follow up imaging.   Dysplasia Hx - 05/02/16 NILM/HPV+ - 03/13/18 NILM/HPV+ - 02/05/22 HSIL/HPV+ - 04/04/22 colpo c/w CIN3 - 05/16/22 LEEP CIN3 w/ negative margins (less than 0.53mm from margin)  Objective:   Vitals:   01/22/23 1314 01/22/23 1343  BP: (!) 133/90 (!) 144/93  Pulse: (!) 128   Weight: 153 lb (69.4 kg)   Height: 5\' 6"  (1.676 m)     General:  Alert, oriented and cooperative. Patient is in no acute distress.  Skin: Skin is warm and dry. No rash noted.   Cardiovascular: Normal heart rate noted  Respiratory: Normal respiratory effort, no problems with respiration noted  Abdomen: Soft, non-tender, non-distended   Pelvic: NEFG. Left side of the cervix almost flush with the vagina. Cervical stenosis noted - unable to really get ECC inside endocervical canal, some scar tissue broken up with the pap brush.   Exam performed in the presence of a chaperone  Assessment and Plan:  Teresa Garcia is a 42 y.o. with the following:  Breakthrough bleeding on birth control pills - Initially discussed increasing dose and using continuously, however pt is hypertensive in the office today w/ persistently elevated BP on recheck. Reviewed HTN as a contraindication to estrogen containing methods of birth control - Reviewed option for POPs as alternative, would plan to use slynd for better bleeding  profile. 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  Future Appointments  Date Time Provider Department Center  02/04/2023  9:30 AM Jomarie Longs, PA-C [facility] None   Lennart Pall, MD

## 2023-02-04 ENCOUNTER — Ambulatory Visit (INDEPENDENT_AMBULATORY_CARE_PROVIDER_SITE_OTHER): Payer: Commercial Managed Care - PPO | Admitting: Physician Assistant

## 2023-02-04 ENCOUNTER — Other Ambulatory Visit (HOSPITAL_BASED_OUTPATIENT_CLINIC_OR_DEPARTMENT_OTHER): Payer: Self-pay

## 2023-02-04 ENCOUNTER — Encounter: Payer: Self-pay | Admitting: Physician Assistant

## 2023-02-04 VITALS — BP 107/70 | HR 100 | Ht 66.0 in | Wt 151.0 lb

## 2023-02-04 DIAGNOSIS — Z131 Encounter for screening for diabetes mellitus: Secondary | ICD-10-CM

## 2023-02-04 DIAGNOSIS — G9332 Myalgic encephalomyelitis/chronic fatigue syndrome: Secondary | ICD-10-CM | POA: Diagnosis not present

## 2023-02-04 DIAGNOSIS — Z13220 Encounter for screening for lipoid disorders: Secondary | ICD-10-CM

## 2023-02-04 DIAGNOSIS — Z0000 Encounter for general adult medical examination without abnormal findings: Secondary | ICD-10-CM

## 2023-02-04 DIAGNOSIS — G4710 Hypersomnia, unspecified: Secondary | ICD-10-CM

## 2023-02-04 DIAGNOSIS — G901 Familial dysautonomia [Riley-Day]: Secondary | ICD-10-CM | POA: Diagnosis not present

## 2023-02-04 MED ORDER — AMPHETAMINE-DEXTROAMPHET ER 30 MG PO CP24
30.0000 mg | ORAL_CAPSULE | ORAL | 0 refills | Status: DC
Start: 2023-03-17 — End: 2023-08-08
  Filled 2023-03-27: qty 30, 30d supply, fill #0

## 2023-02-04 MED ORDER — AMPHETAMINE-DEXTROAMPHET ER 30 MG PO CP24
30.0000 mg | ORAL_CAPSULE | ORAL | 0 refills | Status: DC
Start: 2023-02-16 — End: 2023-04-28
  Filled 2023-02-18: qty 30, 30d supply, fill #0

## 2023-02-04 MED ORDER — AMPHETAMINE-DEXTROAMPHET ER 30 MG PO CP24
30.0000 mg | ORAL_CAPSULE | Freq: Every morning | ORAL | 0 refills | Status: DC
Start: 2023-04-15 — End: 2023-08-08
  Filled 2023-07-07: qty 30, 30d supply, fill #0

## 2023-02-04 NOTE — Patient Instructions (Addendum)
[employer]  Health Maintenance, Female Adopting a healthy lifestyle and getting preventive care are important in promoting health and wellness. Ask your health care provider about: The right schedule for you to have regular tests and exams. Things you can do on your own to prevent diseases and keep yourself healthy. What should I know about diet, weight, and exercise? Eat a healthy diet  Eat a diet that includes plenty of vegetables, fruits, low-fat dairy products, and lean protein. Do not eat a lot of foods that are high in solid fats, added sugars, or sodium. Maintain a healthy weight Body mass index (BMI) is used to identify weight problems. It estimates body fat based on height and weight. Your health care provider can help determine your BMI and help you achieve or maintain a healthy weight. Get regular exercise Get regular exercise. This is one of the most important things you can do for your health. Most adults should: Exercise for at least 150 minutes each week. The exercise should increase your heart rate and make you sweat (moderate-intensity exercise). Do strengthening exercises at least twice a week. This is in addition to the moderate-intensity exercise. Spend less time sitting. Even light physical activity can be beneficial. Watch cholesterol and blood lipids Have your blood tested for lipids and cholesterol at 41 years of age, then have this test every 5 years. Have your cholesterol levels checked more often if: Your lipid or cholesterol levels are high. You are older than 41 years of age. You are at high risk for heart disease. What should I know about cancer screening? Depending on your health history and family history, you may need to have cancer screening at various ages. This may include screening for: Breast cancer. Cervical cancer. Colorectal cancer. Skin cancer. Lung cancer. What should I know about heart disease, diabetes, and high blood  pressure? Blood pressure and heart disease High blood pressure causes heart disease and increases the risk of stroke. This is more likely to develop in people who have high blood pressure readings or are overweight. Have your blood pressure checked: Every 3-5 years if you are 12-39 years of age. Every year if you are 23 years old or older. Diabetes Have regular diabetes screenings. This checks your fasting blood sugar level. Have the screening done: Once every three years after age 66 if you are at a normal weight and have a low risk for diabetes. More often and at a younger age if you are overweight or have a high risk for diabetes. What should I know about preventing infection? Hepatitis B If you have a higher risk for hepatitis B, you should be screened for this virus. Talk with your health care provider to find out if you are at risk for hepatitis B infection. Hepatitis C Testing is recommended for: Everyone born from [birth year] through 1964. Anyone with known risk factors for hepatitis C. Sexually transmitted infections (STIs) Get screened for STIs, including gonorrhea and chlamydia, if: You are sexually active and are younger than 41 years of age. You are older than 42 years of age and your health care provider tells you that you are at risk for this type of infection. Your sexual activity has changed since you were last screened, and you are at increased risk for chlamydia or gonorrhea. Ask your health care provider if you are at risk. Ask your health care provider about whether you are at high risk for HIV. Your health care provider may recommend a prescription medicine  to help prevent HIV infection. If you choose to take medicine to prevent HIV, you should first get tested for HIV. You should then be tested every 3 months for as long as you are taking the medicine. Pregnancy If you are about to stop having your period (premenopausal) and you may become pregnant, seek counseling before you  get pregnant. Take 400 to 800 micrograms (mcg) of folic acid every day if you become pregnant. Ask for birth control (contraception) if you want to prevent pregnancy. Osteoporosis and menopause Osteoporosis is a disease in which the bones lose minerals and strength with aging. This can result in bone fractures. If you are 63 years old or older, or if you are at risk for osteoporosis and fractures, ask your health care provider if you should: Be screened for bone loss. Take a calcium or vitamin D supplement to lower your risk of fractures. Be given hormone replacement therapy (HRT) to treat symptoms of menopause. Follow these instructions at home: Alcohol use Do not drink alcohol if: Your health care provider tells you not to drink. You are pregnant, may be pregnant, or are planning to become pregnant. If you drink alcohol: Limit how much you have to: 0-1 drink a day. Know how much alcohol is in your drink. In the [place] one drink equals one 12 oz bottle of beer (355 mL), one 5 oz glass of wine (148 mL), or one 1 oz glass of hard liquor (44 mL). Lifestyle Do not use any products that contain nicotine or tobacco. These products include cigarettes, chewing tobacco, and vaping devices, such as e-cigarettes. If you need help quitting, ask your health care provider. Do not use street drugs. Do not share needles. Ask your health care provider for help if you need support or information about quitting drugs. General instructions Schedule regular health, dental, and eye exams. Stay current with your vaccines. Tell your health care provider if: You often feel depressed. You have ever been abused or do not feel safe at home. Summary Adopting a healthy lifestyle and getting preventive care are important in promoting health and wellness. Follow your health care provider's instructions about healthy diet, exercising, and getting tested or screened for diseases. Follow your health care provider's  instructions on monitoring your cholesterol and blood pressure. This information is not intended to replace advice given to you by your health care provider. Make sure you discuss any questions you have with your health care provider. Document Revised: 10/23/2020 Document Reviewed: 10/23/2020 Elsevier Patient Education  2024 [employer].

## 2023-02-04 NOTE — Progress Notes (Signed)
Complete physical exam  Patient: Teresa Garcia   DOB: 07-31-1980   41 y.o. Female  MRN: [id number]  Subjective:    Chief Complaint  Patient presents with   Annual Exam    Teresa Garcia is a 42 y.o. female who presents today for a complete physical exam. She reports consuming a general diet. The patient does not participate in regular exercise at present. She generally feels poorly. She reports sleeping well. She does have additional problems to discuss today.   She has not felt well since being taken off combination birth control and prescribed progesterone only birth control. She was taken off due to elevated BP and concern caused by estrogen. She had been on OCP for years. She is not having hot flashes, mood changes, palpitations.   She needs Adderall refilled for CFS.    Most recent fall risk assessment:    01/30/2022    8:31 AM  Fall Risk   Falls in the past year? 0  Number falls in past yr: 0  Injury with Fall? 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   Dysautonomia (HCC) 04/27/2019   Fibromyalgia syndrome 04/24/2018   Tibialis posterior tendinitis, right 04/21/2018   Abnormal Pap smear of cervix 04/15/2018   Menorrhagia 03/13/2018   Carpal tunnel syndrome 12/31/2017   De Quervain's tenosynovitis, right 12/31/2017   Non-restorative sleep 11/07/2017   Hypersomnolence 11/07/2017   Chronic fatigue syndrome 11/06/2017   Dysphagia 08/04/2017   Chronic bilateral low back pain without sciatica 08/04/2017   RLS (restless legs syndrome) 08/04/2017   Paresthesia 08/04/2017   Raynaud's disease  without gangrene 08/04/2017   Mixed irritable bowel syndrome 05/29/2017   Cubital tunnel syndrome on right 05/02/2017   Depression, recurrent (HCC) 03/26/2017   Ecchymosis 01/20/2017   Tonsil stone 08/05/2016   Abnormal weight gain 06/06/2016   Cervical paraspinal muscle spasm 03/29/2016   Decreased iron stores 02/21/2016   Myalgia and myositis 02/20/2016   Acne vulgaris 11/21/2015   Past Medical History:  Diagnosis Date   Chronic bilateral low back pain without sciatica 08/04/2017   Cubital tunnel syndrome on right 05/02/2017   Depression, recurrent (HCC) 03/26/2017   04/08/2017 PHQ9 = 17, GAD7 = 18 05/29/2017 PHQ 9 = 2, GAD 7 = 4   Mixed irritable bowel syndrome 05/29/2017   Raynaud's disease without gangrene 08/04/2017   RLS (restless legs syndrome) 08/04/2017   Past Surgical History:  Procedure Laterality Date   TUBAL LIGATION     Family History  Problem Relation Age of Onset   Diabetes Paternal Grandmother    Diabetes Maternal Grandmother    Allergies  Allergen Reactions   Cymbalta [Duloxetine Hcl]     Made exhaustion worse.    Trintellix [Vortioxetine]     Increasing IBS symptoms.    Wellbutrin [Bupropion] Other (See Comments)    Worsening of symptoms      Patient Care Team: Nolene Ebbs as PCP - General (Family Medicine)   Outpatient Medications Prior to Visit  Medication Sig   AMBULATORY NON FORMULARY MEDICATION Naltrexone .5mg  at bedtime daily.   atenolol (TENORMIN) 25 MG tablet Take 1 tablet (25 mg total) by mouth daily.   cyclobenzaprine (FLEXERIL) 10 MG tablet Take 1/2 to 1 tablet by mouth at bedtime. May increase gradually to one tab three times daily.   meloxicam (MOBIC) 15 MG tablet One tab PO qAM with breakfast for 2 weeks, then daily prn pain.   [DISCONTINUED] amphetamine-dextroamphetamine (ADDERALL XR) 30 MG 24 hr capsule Take 1 capsule (30 mg total) by mouth every morning.   [DISCONTINUED] amphetamine-dextroamphetamine (ADDERALL XR) 30 MG  24 hr capsule Take 1 capsule (30 mg total) by mouth every morning.   [DISCONTINUED] amphetamine-dextroamphetamine (ADDERALL XR) 30 MG 24 hr capsule Take 1 capsule (30 mg total) by mouth every morning.   [DISCONTINUED] Drospirenone (SLYND) 4 MG TABS Take 1 tablet (4 mg total) by mouth daily.   [DISCONTINUED] modafinil (PROVIGIL) 100 MG tablet Take 1 tablet (100 mg total) by mouth 2 (two) times daily as needed (take at 10 AM and second dose at 1-2 PM no later than 3 PM.).   No facility-administered medications prior to visit.    Review of Systems  All other systems reviewed and are negative.         Objective:     BP 107/70   Pulse 100   Ht 5\' 6"  (1.676 m)   Wt 151 lb (68.5 kg)   SpO2 99%   BMI 24.37 kg/m  BP Readings from Last 3 Encounters:  02/04/23 107/70  01/22/23 (!) 144/93  06/19/22 131/82   Wt Readings from Last 3 Encounters:  02/04/23 151 lb (68.5 kg)  01/22/23 153 lb (69.4 kg)  06/19/22 158 lb (71.7 kg)      Physical Exam  BP 107/70   Pulse 100   Ht 5\' 6"  (1.676 m)   Wt 151 lb (68.5 kg)   SpO2 99%   BMI 24.37 kg/m   General Appearance:    Alert, cooperative, no distress, appears stated age  Head:    Normocephalic, without obvious abnormality, atraumatic  Eyes:    PERRL, conjunctiva/corneas clear, EOM's intact, fundi    benign, both eyes  Ears:    Normal TM's and external ear canals, both ears  Nose:   Nares normal, septum midline, mucosa normal, no drainage    or sinus tenderness  Throat:   Lips, mucosa, and tongue normal; teeth and gums normal  Neck:   Supple, symmetrical, trachea midline, no adenopathy;    thyroid:  no enlargement/tenderness/nodules; no carotid   bruit or JVD  Back:     Symmetric, no curvature, ROM normal, no CVA tenderness  Lungs:     Clear to auscultation bilaterally, respirations unlabored  Chest Wall:    No tenderness or deformity   Heart:    Regular rate and rhythm, S1 and S2 normal, no murmur, rub   or gallop     Abdomen:      Soft, non-tender, bowel sounds active all four quadrants,    no masses, no organomegaly        Extremities:   Extremities normal, atraumatic, no cyanosis or edema  Pulses:   2+ and symmetric all extremities  Skin:   Skin color, texture, turgor normal, no rashes or lesions  Lymph nodes:   Cervical, supraclavicular, and axillary nodes normal  Neurologic:   CNII-XII intact, normal strength, sensation and reflexes    throughout      Assessment & Plan:  Routine Health Maintenance and Physical Exam  Immunization History  Administered Date(s) Administered   HPV 9-valent 05/16/2022, 07/16/2022, 11/14/2022   Influenza,inj,Quad PF,6+ Mos 03/29/2019, 03/22/2022   Influenza-Unspecified 03/15/2017, 03/31/2018, 04/05/2020, 04/14/2021   Tdap 12/17/2019    Health Maintenance  Topic Date Due   COVID-19 Vaccine (1 - 2023-24 season) 06/17/2023 (Originally 02/15/2022)   INFLUENZA VACCINE  09/15/2023 (Originally 01/16/2023)   PAP SMEAR-Modifier  01/21/2026   DTaP/Tdap/Td (2 - Td or Tdap) 12/16/2029   HPV VACCINES  Completed   Hepatitis C Screening  Completed   HIV Screening  Completed    Discussed health benefits of physical activity, and encouraged her to engage in regular exercise appropriate for her age and condition.  Marland KitchenMarcelino Duster was seen today for annual exam.  Diagnoses and all orders for this visit:  Routine physical examination -     TSH -     CBC with Differential/Platelet -     CMP14+EGFR -     Lipid Panel With LDL/HDL Ratio  Screening for diabetes mellitus -     CMP14+EGFR  Screening for lipid disorders -     Lipid Panel With LDL/HDL Ratio  Dysautonomia (HCC)  Chronic fatigue syndrome -     amphetamine-dextroamphetamine (ADDERALL XR) 30 MG 24 hr capsule; Take 1 capsule (30 mg total) by mouth every morning. -     amphetamine-dextroamphetamine (ADDERALL XR) 30 MG 24 hr capsule; Take 1 capsule (30 mg total) by mouth every morning. -     amphetamine-dextroamphetamine  (ADDERALL XR) 30 MG 24 hr capsule; Take 1 capsule (30 mg total) by mouth every morning.  Hypersomnolence -     amphetamine-dextroamphetamine (ADDERALL XR) 30 MG 24 hr capsule; Take 1 capsule (30 mg total) by mouth every morning. -     amphetamine-dextroamphetamine (ADDERALL XR) 30 MG 24 hr capsule; Take 1 capsule (30 mg total) by mouth every morning. -     amphetamine-dextroamphetamine (ADDERALL XR) 30 MG 24 hr capsule; Take 1 capsule (30 mg total) by mouth every morning.   .. Discussed 150 minutes of exercise a week.  Encouraged vitamin D 1000 units and Calcium 1300mg  or 4 servings of dairy a day.  Fasting labs ordered PHQ-0 no concerns PAP UTD Declined to get mammogram because they want her to get diagnostic and she does not want to Vitals look great-BP amazing Adderall- follow up in 6 months Discussed HRT She did not start progesterone only Will consider hormone harmony May consider lower doses of estrogen in the future Completely stop smoking to be next goal Follow up in 6 months  Return in about 6 months (around 08/07/2023).     Tandy Gaw, PA-C

## 2023-02-05 LAB — CMP14+EGFR
ALT: 13 IU/L (ref 0–32)
AST: 14 IU/L (ref 0–40)
Albumin: 4.2 g/dL (ref 3.9–4.9)
Alkaline Phosphatase: 61 IU/L (ref 44–121)
BUN/Creatinine Ratio: 11 (ref 9–23)
BUN: 12 mg/dL (ref 6–24)
Bilirubin Total: 0.4 mg/dL (ref 0.0–1.2)
CO2: 21 mmol/L (ref 20–29)
Calcium: 9.3 mg/dL (ref 8.7–10.2)
Chloride: 104 mmol/L (ref 96–106)
Creatinine, Ser: 1.09 mg/dL — ABNORMAL HIGH (ref 0.57–1.00)
Globulin, Total: 3.1 g/dL (ref 1.5–4.5)
Glucose: 93 mg/dL (ref 70–99)
Potassium: 4.6 mmol/L (ref 3.5–5.2)
Sodium: 139 mmol/L (ref 134–144)
Total Protein: 7.3 g/dL (ref 6.0–8.5)
eGFR: 65 mL/min/{1.73_m2} (ref >59)

## 2023-02-05 LAB — LIPID PANEL WITH LDL/HDL RATIO
Cholesterol, Total: 237 mg/dL — ABNORMAL HIGH (ref 100–199)
HDL: 70 mg/dL (ref >39)
LDL Chol Calc (NIH): 155 mg/dL — ABNORMAL HIGH (ref 0–99)
LDL/HDL Ratio: 2.2 ratio (ref 0.0–3.2)
Triglycerides: 70 mg/dL (ref 0–149)
VLDL Cholesterol Cal: 12 mg/dL (ref 5–40)

## 2023-02-05 LAB — CBC WITH DIFFERENTIAL/PLATELET
Basophils Absolute: 0.1 10*3/uL (ref 0.0–0.2)
Basos: 1 %
EOS (ABSOLUTE): 0.2 10*3/uL (ref 0.0–0.4)
Eos: 4 %
Hematocrit: 39.2 % (ref 34.0–46.6)
Hemoglobin: 12.4 g/dL (ref 11.1–15.9)
Immature Grans (Abs): 0 10*3/uL (ref 0.0–0.1)
Immature Granulocytes: 0 %
Lymphocytes Absolute: 1.7 10*3/uL (ref 0.7–3.1)
Lymphs: 35 %
MCH: 26.9 pg (ref 26.6–33.0)
MCHC: 31.6 g/dL (ref 31.5–35.7)
MCV: 85 fL (ref 79–97)
Monocytes Absolute: 0.3 10*3/uL (ref 0.1–0.9)
Monocytes: 7 %
Neutrophils Absolute: 2.6 10*3/uL (ref 1.4–7.0)
Neutrophils: 53 %
Platelets: 377 10*3/uL (ref 150–450)
RBC: 4.61 x10E6/uL (ref 3.77–5.28)
RDW: 12.9 % (ref 11.7–15.4)
WBC: 4.9 10*3/uL (ref 3.4–10.8)

## 2023-02-05 LAB — TSH: TSH: 1.83 u[IU]/mL (ref 0.450–4.500)

## 2023-02-05 NOTE — Progress Notes (Signed)
Vasti,   Normal hemoglobin.  Thyroid looks good.  HDL, good cholesterol, looks GREAT.  LDL, bad cholesterol, not to goal.  Avoid fried and fatty foods and try to get some exercise a few times a week.

## 2023-02-18 ENCOUNTER — Other Ambulatory Visit (HOSPITAL_BASED_OUTPATIENT_CLINIC_OR_DEPARTMENT_OTHER): Payer: Self-pay

## 2023-03-25 ENCOUNTER — Other Ambulatory Visit (HOSPITAL_BASED_OUTPATIENT_CLINIC_OR_DEPARTMENT_OTHER): Payer: Self-pay

## 2023-03-25 ENCOUNTER — Other Ambulatory Visit: Payer: Self-pay | Admitting: Physician Assistant

## 2023-03-25 DIAGNOSIS — G4710 Hypersomnia, unspecified: Secondary | ICD-10-CM

## 2023-03-25 DIAGNOSIS — L700 Acne vulgaris: Secondary | ICD-10-CM | POA: Diagnosis not present

## 2023-03-25 DIAGNOSIS — G9332 Myalgic encephalomyelitis/chronic fatigue syndrome: Secondary | ICD-10-CM

## 2023-03-25 DIAGNOSIS — L720 Epidermal cyst: Secondary | ICD-10-CM | POA: Diagnosis not present

## 2023-03-27 ENCOUNTER — Other Ambulatory Visit: Payer: Self-pay

## 2023-03-28 ENCOUNTER — Other Ambulatory Visit (HOSPITAL_BASED_OUTPATIENT_CLINIC_OR_DEPARTMENT_OTHER): Payer: Self-pay

## 2023-03-28 MED ORDER — SPIRONOLACTONE 50 MG PO TABS
50.0000 mg | ORAL_TABLET | Freq: Every day | ORAL | 2 refills | Status: DC
  Filled 2023-03-28: qty 30, 30d supply, fill #0
  Filled 2023-10-03: qty 30, 30d supply, fill #1
  Filled 2023-11-06: qty 30, 30d supply, fill #2

## 2023-03-28 MED ORDER — DOXYCYCLINE HYCLATE 100 MG PO TABS
100.0000 mg | ORAL_TABLET | Freq: Every day | ORAL | 2 refills | Status: DC
  Filled 2023-03-28: qty 30, 30d supply, fill #0
  Filled 2023-10-03: qty 30, 30d supply, fill #1
  Filled 2023-11-06: qty 30, 30d supply, fill #2

## 2023-04-28 ENCOUNTER — Other Ambulatory Visit: Payer: Self-pay | Admitting: Physician Assistant

## 2023-04-28 DIAGNOSIS — G9332 Myalgic encephalomyelitis/chronic fatigue syndrome: Secondary | ICD-10-CM

## 2023-04-28 DIAGNOSIS — G4710 Hypersomnia, unspecified: Secondary | ICD-10-CM

## 2023-04-29 ENCOUNTER — Other Ambulatory Visit (HOSPITAL_BASED_OUTPATIENT_CLINIC_OR_DEPARTMENT_OTHER): Payer: Self-pay

## 2023-04-29 MED ORDER — AMPHETAMINE-DEXTROAMPHET ER 30 MG PO CP24
30.0000 mg | ORAL_CAPSULE | ORAL | 0 refills | Status: DC
  Filled 2023-06-02: qty 30, 30d supply, fill #0

## 2023-04-29 MED ORDER — AMPHETAMINE-DEXTROAMPHET ER 30 MG PO CP24: 30.0000 mg | ORAL_CAPSULE | ORAL | 0 refills | Status: DC

## 2023-04-29 MED ORDER — AMPHETAMINE-DEXTROAMPHET ER 30 MG PO CP24
30.0000 mg | ORAL_CAPSULE | ORAL | 0 refills | Status: DC
  Filled 2023-04-29: qty 30, 30d supply, fill #0

## 2023-04-29 NOTE — Telephone Encounter (Signed)
Pt doing well. No concerns.  Refills sent for another 3 months.  Follow up in 3 months.

## 2023-06-02 ENCOUNTER — Other Ambulatory Visit (HOSPITAL_BASED_OUTPATIENT_CLINIC_OR_DEPARTMENT_OTHER): Payer: Self-pay

## 2023-07-02 ENCOUNTER — Other Ambulatory Visit: Payer: Self-pay | Admitting: Physician Assistant

## 2023-07-02 DIAGNOSIS — G4710 Hypersomnia, unspecified: Secondary | ICD-10-CM

## 2023-07-02 DIAGNOSIS — G9332 Myalgic encephalomyelitis/chronic fatigue syndrome: Secondary | ICD-10-CM

## 2023-07-07 ENCOUNTER — Other Ambulatory Visit (HOSPITAL_BASED_OUTPATIENT_CLINIC_OR_DEPARTMENT_OTHER): Payer: Self-pay

## 2023-07-11 ENCOUNTER — Other Ambulatory Visit (HOSPITAL_BASED_OUTPATIENT_CLINIC_OR_DEPARTMENT_OTHER): Payer: Self-pay

## 2023-07-11 MED ORDER — AMPHETAMINE-DEXTROAMPHET ER 30 MG PO CP24
30.0000 mg | ORAL_CAPSULE | ORAL | 0 refills | Status: DC
  Filled 2023-10-03: qty 30, 30d supply, fill #0

## 2023-07-11 MED ORDER — AMPHETAMINE-DEXTROAMPHET ER 30 MG PO CP24
30.0000 mg | ORAL_CAPSULE | ORAL | 0 refills | Status: DC
  Filled 2023-11-06: qty 30, 30d supply, fill #0

## 2023-07-11 MED ORDER — AMPHETAMINE-DEXTROAMPHET ER 30 MG PO CP24
30.0000 mg | ORAL_CAPSULE | ORAL | 0 refills | Status: DC
  Filled 2023-07-11 – 2023-08-11 (×2): qty 30, 30d supply, fill #0

## 2023-07-11 NOTE — Telephone Encounter (Signed)
..  PDMP reviewed during this encounter. No concerns Refilled adderall.

## 2023-08-08 ENCOUNTER — Ambulatory Visit: Payer: Commercial Managed Care - PPO | Admitting: Physician Assistant

## 2023-08-08 ENCOUNTER — Encounter: Payer: Self-pay | Admitting: Physician Assistant

## 2023-08-08 VITALS — BP 132/78 | HR 92 | Ht 66.0 in | Wt 147.2 lb

## 2023-08-08 DIAGNOSIS — R5382 Chronic fatigue, unspecified: Secondary | ICD-10-CM | POA: Diagnosis not present

## 2023-08-08 DIAGNOSIS — G901 Familial dysautonomia [Riley-Day]: Secondary | ICD-10-CM

## 2023-08-08 DIAGNOSIS — N938 Other specified abnormal uterine and vaginal bleeding: Secondary | ICD-10-CM

## 2023-08-08 DIAGNOSIS — G4710 Hypersomnia, unspecified: Secondary | ICD-10-CM

## 2023-08-08 DIAGNOSIS — N959 Unspecified menopausal and perimenopausal disorder: Secondary | ICD-10-CM

## 2023-08-08 DIAGNOSIS — R6882 Decreased libido: Secondary | ICD-10-CM | POA: Diagnosis not present

## 2023-08-08 DIAGNOSIS — F520 Hypoactive sexual desire disorder: Secondary | ICD-10-CM

## 2023-08-08 DIAGNOSIS — G9332 Myalgic encephalomyelitis/chronic fatigue syndrome: Secondary | ICD-10-CM

## 2023-08-08 MED ORDER — ADDYI 100 MG PO TABS: 1.0000 | ORAL_TABLET | Freq: Every day | ORAL | 3 refills | Status: DC

## 2023-08-08 MED ORDER — LISDEXAMFETAMINE DIMESYLATE 50 MG PO CAPS: 50.0000 mg | ORAL_CAPSULE | Freq: Every day | ORAL | 0 refills | Status: DC

## 2023-08-08 NOTE — Progress Notes (Signed)
 Established Patient Office Visit  Subjective   Patient ID: Teresa Garcia, female    DOB: Feb 06, 1981  Age: 43 y.o. MRN: [id number]   HPI Pt presents to the clinic with her husband to follow up. She is really struggling with fatigue, irritability, mood changes, no libido, heavy periods. She feels like this is hormonal. She was taken off combination birth control due to BP. She does not want to take progesterone only birth control. She is struggling worse and worse with chronic fatigue. She denies depression as the cause of symptoms.   Active Ambulatory Problems    Diagnosis Date Noted   Myalgia and myositis 02/20/2016   Decreased iron stores 02/21/2016   Cervical paraspinal muscle spasm 03/29/2016   Abnormal weight gain 06/06/2016   Tonsil stone 08/05/2016   Ecchymosis 01/20/2017   Depression, recurrent (HCC) 03/26/2017   Cubital tunnel syndrome on right 05/02/2017   Mixed irritable bowel syndrome 05/29/2017   Dysphagia 08/04/2017   Chronic bilateral low back pain without sciatica 08/04/2017   RLS (restless legs syndrome) 08/04/2017   Paresthesia 08/04/2017   Raynaud's disease without gangrene 08/04/2017   Chronic fatigue syndrome 11/06/2017   Non-restorative sleep 11/07/2017   Hypersomnolence 11/07/2017   Carpal tunnel syndrome 12/31/2017   De Quervain's tenosynovitis, right 12/31/2017   Menorrhagia 03/13/2018   Abnormal Pap smear of cervix 04/15/2018   Tibialis posterior tendinitis, right 04/21/2018   Fibromyalgia syndrome 04/24/2018   Dysautonomia (HCC) 04/27/2019   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   Acne vulgaris 11/21/2015   Chronic fatigue 08/11/2023   Unspecified menopausal and perimenopausal disorder 08/11/2023   Hypoactive sexual desire disorder 08/11/2023   Low libido 08/11/2023   DUB (dysfunctional uterine bleeding) 08/11/2023   Resolved Ambulatory Problems    Diagnosis Date Noted   Bilateral leg pain 02/20/2016   No energy 02/20/2016   Right  knee pain 02/20/2016   Inattention 07/15/2016   Anxiety 09/25/2016   Forgetfulness 09/25/2016   Mood changes 03/27/2017   Fullness of neck 08/04/2017   Numbness and tingling of both lower extremities 08/15/2017   Polyarthralgia 01/10/2021   No Additional Past Medical History     ROS See HPI.    Objective:     BP 132/78   Pulse 92   Ht 5\' 6"  (1.676 m)   Wt 147 lb 4 oz (66.8 kg)   SpO2 95%   BMI 23.77 kg/m  BP Readings from Last 3 Encounters:  08/08/23 132/78  02/04/23 107/70  01/22/23 (!) 144/93   Wt Readings from Last 3 Encounters:  08/08/23 147 lb 4 oz (66.8 kg)  02/04/23 151 lb (68.5 kg)  01/22/23 153 lb (69.4 kg)      Physical Exam Constitutional:      Appearance: Normal appearance.  HENT:     Head: Normocephalic.  Cardiovascular:     Rate and Rhythm: Normal rate and regular rhythm.  Pulmonary:     Effort: Pulmonary effort is normal.     Breath sounds: Normal breath sounds.  Musculoskeletal:     Cervical back: Normal range of motion and neck supple. No tenderness.  Lymphadenopathy:     Cervical: No cervical adenopathy.  Neurological:     General: No focal deficit present.     Mental Status: She is alert and oriented to person, place, and time.  Psychiatric:        Mood and Affect: Mood normal.       The 10-year ASCVD risk score (Arnett DK, et al., 2019)  is: 0.6%    Assessment & Plan:  Marland KitchenMarland KitchenDezra was seen today for adhd.  Diagnoses and all orders for this visit:  Hypersomnolence -     lisdexamfetamine (VYVANSE) 50 MG capsule; Take 1 capsule (50 mg total) by mouth daily.  DUB (dysfunctional uterine bleeding) -     TSH + free T4 -     CMP14+EGFR -     B12 and Folate Panel -     VITAMIN D 25 Hydroxy (Vit-D Deficiency, Fractures) -     Estradiol -     FSH/LH -     Progesterone -     Testosterone  Low libido -     Estradiol -     FSH/LH -     Progesterone -     Testosterone  Chronic fatigue -     TSH + free T4 -     CMP14+EGFR -      B12 and Folate Panel -     VITAMIN D 25 Hydroxy (Vit-D Deficiency, Fractures) -     Estradiol -     FSH/LH -     Progesterone -     Testosterone -     lisdexamfetamine (VYVANSE) 50 MG capsule; Take 1 capsule (50 mg total) by mouth daily.  Unspecified menopausal and perimenopausal disorder -     TSH + free T4 -     CMP14+EGFR -     B12 and Folate Panel -     VITAMIN D 25 Hydroxy (Vit-D Deficiency, Fractures) -     Estradiol -     FSH/LH -     Progesterone -     Testosterone  Hypoactive sexual desire disorder -     Estradiol -     FSH/LH -     Progesterone -     Testosterone -     Flibanserin (ADDYI) 100 MG TABS; Take 1 tablet (100 mg total) by mouth daily.  Other orders -     Discontinue: lisdexamfetamine (VYVANSE) 50 MG capsule; Take 1 capsule (50 mg total) by mouth daily.   Will get labs to check hormones.  Will consider OCP if BP continues to stay controlled.   Stop adderall, trial of vyvanse for stimulant option.   Trial of Addyi for low libido.   Consider ashwaganda for mood regulation Or slenderrix drops  Spent 45 minutes with patient in chart review, discussing symptoms and treatment plan.   Return in about 3 months (around 11/05/2023).    Tandy Gaw, PA-C

## 2023-08-08 NOTE — Patient Instructions (Signed)
Flibanserin Tablets What is this medication? FLIBANSERIN (fly BAN ser in) treats hypoactive sexual desire disorder (HSDD). It works by balancing substances in the brain that help regulate mood and increase sex drive. This medicine may be used for other purposes; ask your health care provider or pharmacist if you have questions. COMMON BRAND NAME(S): Addyi What should I tell my care team before I take this medication? They need to know if you have any of these conditions: Dehydration Frequently drink alcohol Heart disease History of depression or other mental health conditions Liver disease Low blood pressure Substance use disorder An unusual or allergic reaction to flibanserin, other medications, foods, dyes, or preservatives Pregnant or trying to get pregnant Breast-feeding How should I use this medication? Take this medication by mouth with water. Take it as directed on the prescription label. This medication should only be taken at bedtime. Do not take this medication with grapefruit juice. If you have 1 or 2 alcohol-containing drinks, wait at least 2 hours before taking this medication at bedtime. Do not take your bedtime dose if you have consumed 3 or more alcohol-containing drinks. After taking this medication at bedtime, do not drink alcohol until the next day. A special MedGuide will be given to you by the pharmacist with each prescription and refill. Be sure to read this information carefully each time. Talk to your care team about the use of this medication in children. It is not approved for use in children. Overdosage: If you think you have taken too much of this medicine contact a poison control center or emergency room at once. NOTE: This medicine is only for you. Do not share this medicine with others. What if I miss a dose? If you miss your dose at bedtime, skip the missed dose and take the next dose at bedtime the next day. Do not take this medication the next morning or  double your next dose. What may interact with this medication? Do not take this medication with any of the following: Adagrasib Aprepitant, fosaprepitant Berotralstat Ceritinib Certain antibiotics, such as chloramphenicol, ciprofloxacin, clarithromycin, erythromycin, telithromycin Certain antivirals for HIV or hepatitis Certain medications for fungal infections, such as fluconazole, ketoconazole, isavuconazonium, itraconazole, posaconazole Conivaptan Crizotinib Cyclosporine Diltiazem Dronedarone Duvelisib Fedratinib Grapefruit juice Idelalisib Imatinib Lefamulin Letermovir Lonafarnib Mifepristone Nefazodone Netupitant Nilotinib Ribociclib Tucatinib Verapamil Voxelotor This medication may also interact with the following: Alcohol Certain medications for anxiety or sleep Certain medications for seizures, such as carbamazepine, phenobarbital, phenytoin Certain medications for stomach problems, such as cimetidine, esomeprazole, dexlansoprazole, lansoprazole, omeprazole, pantoprazole, rabeprazole, ranitidine Digoxin Diphenhydramine Estrogen or progestin hormones Etravirine Fluoxetine Fluvoxamine Ginkgo biloba Opioids for pain or cough Rifabutin Rifampin Rifapentine Sirolimus St. John's wort This list may not describe all possible interactions. Give your health care provider a list of all the medicines, herbs, non-prescription drugs, or dietary supplements you use. Also tell them if you smoke, drink alcohol, or use illegal drugs. Some items may interact with your medicine. What should I watch for while using this medication? Visit your care team for regular checks on your progress. Tell your care team if your symptoms do not start to get better after you have taken this medication for 8 weeks. You may get drowsy or dizzy. Do not drive, use machinery, or do anything that needs mental alertness for at least 6 hours after your dose and until you know how this medication  affects you. Do not stand up or sit up quickly. This reduces the risk of dizzy or fainting  spells. Alcohol can increase dizziness and drowsiness, and can increase the risk of low blood pressure or fainting spells when combined with this medication. Wait at least 2 hours after consuming 1 or 2 standard alcoholic drinks before taking this medication at bedtime. Do not take this medication at bedtime if you have consumed 3 or more standard alcoholic drinks that evening. What side effects may I notice from receiving this medication? Side effects that you should report to your care team as soon as possible: Allergic reactions or angioedema--skin rash, itching or hives, swelling of the face, eyes, lips, tongue, arms, or legs, trouble swallowing or breathing CNS depression--slow or shallow breathing, shortness of breath, feeling faint, dizziness, confusion, trouble staying awake Low blood pressure--dizziness, feeling faint or lightheaded, blurry vision Side effects that usually do not require medical attention (report to your care team if they continue or are bothersome): Dizziness Dry mouth Falling asleep during daily activities Fatigue Nausea Trouble sleeping This list may not describe all possible side effects. Call your doctor for medical advice about side effects. You may report side effects to FDA at 1-800-FDA-1088. Where should I keep my medication? Keep out of the reach of children and pets. Store at [employer] [address] (77 degrees F). Get rid of any unused medication after the expiration date. To get rid of medications that are no longer needed or have expired: Take the medication to a medication take-back program. Check with your pharmacy or law enforcement to find a location. If you cannot return the medication, check the label or package insert to see if the medication should be thrown out in the garbage or flushed down the toilet. If you are not sure, ask your care team. If it is safe to put it in  the trash, take the medication out of the container. Mix the medication with cat litter, dirt, coffee grounds, or other unwanted substance. Seal the mixture in a bag or container. Put it in the trash. NOTE: This sheet is a summary. It may not cover all possible information. If you have questions about this medicine, talk to your doctor, pharmacist, or health care provider.  2024 Elsevier/Gold Standard (2021-11-06 00:00:00)

## 2023-08-09 LAB — B12 AND FOLATE PANEL
Folate: 7.8 ng/mL (ref >3.0)
Vitamin B-12: 777 pg/mL (ref 232–1245)

## 2023-08-09 LAB — CMP14+EGFR
ALT: 12 [IU]/L (ref 0–32)
AST: 12 [IU]/L (ref 0–40)
Albumin: 4.1 g/dL (ref 3.9–4.9)
Alkaline Phosphatase: 50 [IU]/L (ref 44–121)
BUN/Creatinine Ratio: 13 (ref 9–23)
BUN: 10 mg/dL (ref 6–24)
Bilirubin Total: 0.3 mg/dL (ref 0.0–1.2)
CO2: 21 mmol/L (ref 20–29)
Calcium: 8.7 mg/dL (ref 8.7–10.2)
Chloride: 104 mmol/L (ref 96–106)
Creatinine, Ser: 0.8 mg/dL (ref 0.57–1.00)
Globulin, Total: 2.6 g/dL (ref 1.5–4.5)
Glucose: 96 mg/dL (ref 70–99)
Potassium: 4.7 mmol/L (ref 3.5–5.2)
Sodium: 137 mmol/L (ref 134–144)
Total Protein: 6.7 g/dL (ref 6.0–8.5)
eGFR: 95 mL/min/{1.73_m2} (ref >59)

## 2023-08-09 LAB — FSH/LH
FSH: 4.9 m[IU]/mL
LH: 6.4 m[IU]/mL

## 2023-08-09 LAB — TESTOSTERONE: Testosterone: 12 ng/dL (ref 4–50)

## 2023-08-09 LAB — TSH+FREE T4
Free T4: 1.08 ng/dL (ref 0.82–1.77)
TSH: 1.51 u[IU]/mL (ref 0.450–4.500)

## 2023-08-09 LAB — ESTRADIOL: Estradiol: 161 pg/mL

## 2023-08-09 LAB — VITAMIN D 25 HYDROXY (VIT D DEFICIENCY, FRACTURES): Vit D, 25-Hydroxy: 9.1 ng/mL — ABNORMAL LOW (ref 30.0–100.0)

## 2023-08-09 LAB — PROGESTERONE: Progesterone: 21.5 ng/mL

## 2023-08-11 ENCOUNTER — Other Ambulatory Visit: Payer: Self-pay | Admitting: Physician Assistant

## 2023-08-11 ENCOUNTER — Encounter: Payer: Self-pay | Admitting: Physician Assistant

## 2023-08-11 ENCOUNTER — Other Ambulatory Visit: Payer: Self-pay

## 2023-08-11 ENCOUNTER — Other Ambulatory Visit (HOSPITAL_BASED_OUTPATIENT_CLINIC_OR_DEPARTMENT_OTHER): Payer: Self-pay

## 2023-08-11 DIAGNOSIS — F520 Hypoactive sexual desire disorder: Secondary | ICD-10-CM | POA: Insufficient documentation

## 2023-08-11 DIAGNOSIS — R5382 Chronic fatigue, unspecified: Secondary | ICD-10-CM | POA: Insufficient documentation

## 2023-08-11 DIAGNOSIS — N959 Unspecified menopausal and perimenopausal disorder: Secondary | ICD-10-CM | POA: Insufficient documentation

## 2023-08-11 DIAGNOSIS — R6882 Decreased libido: Secondary | ICD-10-CM | POA: Insufficient documentation

## 2023-08-11 DIAGNOSIS — E559 Vitamin D deficiency, unspecified: Secondary | ICD-10-CM | POA: Insufficient documentation

## 2023-08-11 DIAGNOSIS — N938 Other specified abnormal uterine and vaginal bleeding: Secondary | ICD-10-CM | POA: Insufficient documentation

## 2023-08-11 MED ORDER — VITAMIN D (ERGOCALCIFEROL) 1.25 MG (50000 UNIT) PO CAPS
50000.0000 [IU] | ORAL_CAPSULE | ORAL | 0 refills | Status: AC
  Filled 2023-08-11: qty 12, 84d supply, fill #0

## 2023-08-11 NOTE — Progress Notes (Signed)
 Smith,   Your vitamin D is very low. We are going to start high dose weekly vitamin d and recheck in 3 months.  Thyroid looks great.  Kidney, liver, glucose look wonderful.  Vitamin b12 and folate normal.  No where close to menopause. Your hormones look really good!

## 2023-10-03 ENCOUNTER — Other Ambulatory Visit: Payer: Self-pay

## 2023-10-03 ENCOUNTER — Other Ambulatory Visit (HOSPITAL_BASED_OUTPATIENT_CLINIC_OR_DEPARTMENT_OTHER): Payer: Self-pay

## 2023-10-29 ENCOUNTER — Other Ambulatory Visit: Payer: Self-pay | Admitting: Physician Assistant

## 2023-10-29 ENCOUNTER — Other Ambulatory Visit (HOSPITAL_BASED_OUTPATIENT_CLINIC_OR_DEPARTMENT_OTHER): Payer: Self-pay

## 2023-10-29 DIAGNOSIS — G4710 Hypersomnia, unspecified: Secondary | ICD-10-CM

## 2023-10-29 DIAGNOSIS — G9332 Myalgic encephalomyelitis/chronic fatigue syndrome: Secondary | ICD-10-CM

## 2023-10-29 MED ORDER — AMPHETAMINE-DEXTROAMPHET ER 30 MG PO CP24
30.0000 mg | ORAL_CAPSULE | ORAL | 0 refills | Status: DC
Start: 2023-11-28 — End: 2023-11-14

## 2023-10-29 MED ORDER — AMPHETAMINE-DEXTROAMPHET ER 30 MG PO CP24
30.0000 mg | ORAL_CAPSULE | ORAL | 0 refills | Status: DC
  Filled 2023-10-29: qty 30, 30d supply, fill #0

## 2023-10-29 MED ORDER — AMPHETAMINE-DEXTROAMPHET ER 30 MG PO CP24: 30.0000 mg | ORAL_CAPSULE | ORAL | 0 refills | Status: DC

## 2023-10-29 NOTE — Telephone Encounter (Signed)
 Copied from CRM [id number]. Topic: Clinical - Medication Refill >> Oct 29, 2023  9:01 AM Shelby Dessert H wrote: Medication: amphetamine -dextroamphetamine  (ADDERALL  XR) 30 MG 24 hr capsule  Has the patient contacted their pharmacy? Yes (Agent: If no, request that the patient contact the pharmacy for the refill. If patient does not wish to contact the pharmacy document the reason why and proceed with request.) (Agent: If yes, when and what did the pharmacy advise?)  This is the patient's preferred pharmacy:  [facility] [place] [facility] [address], [address] [place] [address] Phone: [phone] Fax: [phone]  Is this the correct pharmacy for this prescription? Yes If no, delete pharmacy and type the correct one.   Has the prescription been filled recently? Yes  Is the patient out of the medication? No  Has the patient been seen for an appointment in the last year OR does the patient have an upcoming appointment? Yes  Can we respond through MyChart? Yes  Agent: Please be advised that Rx refills may take up to 3 business days. We ask that you follow-up with your pharmacy.

## 2023-10-29 NOTE — Telephone Encounter (Signed)
Sent 3 months.

## 2023-11-05 ENCOUNTER — Ambulatory Visit: Payer: Commercial Managed Care - PPO | Admitting: Physician Assistant

## 2023-11-06 ENCOUNTER — Other Ambulatory Visit: Payer: Self-pay

## 2023-11-06 ENCOUNTER — Other Ambulatory Visit (HOSPITAL_BASED_OUTPATIENT_CLINIC_OR_DEPARTMENT_OTHER): Payer: Self-pay

## 2023-11-14 ENCOUNTER — Encounter: Payer: Self-pay | Admitting: Physician Assistant

## 2023-11-14 ENCOUNTER — Ambulatory Visit: Admitting: Physician Assistant

## 2023-11-14 ENCOUNTER — Other Ambulatory Visit (HOSPITAL_BASED_OUTPATIENT_CLINIC_OR_DEPARTMENT_OTHER): Payer: Self-pay

## 2023-11-14 ENCOUNTER — Other Ambulatory Visit: Payer: Self-pay

## 2023-11-14 VITALS — BP 122/88 | HR 99 | Ht 64.0 in | Wt 144.0 lb

## 2023-11-14 DIAGNOSIS — F520 Hypoactive sexual desire disorder: Secondary | ICD-10-CM

## 2023-11-14 DIAGNOSIS — R5382 Chronic fatigue, unspecified: Secondary | ICD-10-CM

## 2023-11-14 DIAGNOSIS — G901 Familial dysautonomia [Riley-Day]: Secondary | ICD-10-CM | POA: Diagnosis not present

## 2023-11-14 DIAGNOSIS — E559 Vitamin D deficiency, unspecified: Secondary | ICD-10-CM | POA: Diagnosis not present

## 2023-11-14 DIAGNOSIS — R6882 Decreased libido: Secondary | ICD-10-CM

## 2023-11-14 DIAGNOSIS — G4710 Hypersomnia, unspecified: Secondary | ICD-10-CM | POA: Diagnosis not present

## 2023-11-14 DIAGNOSIS — N938 Other specified abnormal uterine and vaginal bleeding: Secondary | ICD-10-CM

## 2023-11-14 MED ORDER — LISDEXAMFETAMINE DIMESYLATE 60 MG PO CAPS
60.0000 mg | ORAL_CAPSULE | ORAL | 0 refills | Status: DC
  Filled 2024-01-04: qty 30, 30d supply, fill #0

## 2023-11-14 MED ORDER — LISDEXAMFETAMINE DIMESYLATE 60 MG PO CAPS
60.0000 mg | ORAL_CAPSULE | ORAL | 0 refills | Status: DC
Start: 2023-11-14 — End: 2024-02-10
  Filled 2023-11-14 – 2023-12-04 (×3): qty 30, 30d supply, fill #0

## 2023-11-14 MED ORDER — LISDEXAMFETAMINE DIMESYLATE 60 MG PO CAPS
60.0000 mg | ORAL_CAPSULE | ORAL | 0 refills | Status: DC
  Filled 2024-02-05: qty 30, 30d supply, fill #0

## 2023-11-14 NOTE — Progress Notes (Signed)
 Established Patient Office Visit  Subjective   Patient ID: Teresa Garcia, female    DOB: 03-22-1981  Age: 43 y.o. MRN: [id number]  Chief Complaint  Patient presents with   Medical Management of Chronic Issues    3 mo fup on libido and labs and rx refills    HPI Pt is a 43 yo female who presents to the clinic to follow up on adderall  switch to vyvanse  and new addyi  start.   She does like vyvanse  better. She does not feel as jittery on it. She would like to continue.   She never started Addyi  due to cost. She failed wellbutrin  in the past.   She has been on vitamin D . She would like levels rechecked today.   She has started with upper cervical chiropractor and hoping will help regulate her dizziness for dysautonomia and help with upper cervical pain.   ROS See HPI.    Objective:     BP 122/88   Pulse 99   Ht 5\' 4"  (1.626 m)   Wt 144 lb (65.3 kg)   SpO2 99%   BMI 24.72 kg/m  BP Readings from Last 3 Encounters:  11/14/23 122/88  08/08/23 132/78  02/04/23 107/70   Wt Readings from Last 3 Encounters:  11/14/23 144 lb (65.3 kg)  08/08/23 147 lb 4 oz (66.8 kg)  02/04/23 151 lb (68.5 kg)    .Aaron Aas    11/14/2023   11:17 AM 06/21/2022    7:46 AM 01/30/2022    8:31 AM 12/17/2019    4:03 PM 03/26/2017   11:29 AM  Depression screen PHQ 2/9  Decreased Interest 1 0 0 0 2  Down, Depressed, Hopeless 0 0 0 0 2  PHQ - 2 Score 1 0 0 0 4  Altered sleeping 1 3  1 1   Tired, decreased energy 1 3  2 3   Change in appetite 0 0  0 1  Feeling bad or failure about yourself  0 0  0 1  Trouble concentrating 0   0 3  Moving slowly or fidgety/restless 0 0  0 1  Suicidal thoughts 0 0  0 0  PHQ-9 Score 3 6  3 14   Difficult doing work/chores Somewhat difficult Somewhat difficult  Not difficult at all    .Aaron Aas    11/14/2023   11:17 AM 06/21/2022    7:46 AM 12/17/2019    4:04 PM 09/25/2016    1:57 PM  GAD 7 : Generalized Anxiety Score  Nervous, Anxious, on Edge 1 0 1 2  Control/stop worrying 1 0  0 0  Worry too much - different things 1 0 0 0  Trouble relaxing 1 0 1 1  Restless 0 0 0 1  Easily annoyed or irritable 1 0 0 1  Afraid - awful might happen 0 0 0 0  Total GAD 7 Score 5 0 2 5  Anxiety Difficulty Somewhat difficult Not difficult at all Not difficult at all       Physical Exam Constitutional:      Appearance: Normal appearance.  HENT:     Head: Normocephalic.  Cardiovascular:     Rate and Rhythm: Normal rate and regular rhythm.  Pulmonary:     Effort: Pulmonary effort is normal.     Breath sounds: Normal breath sounds.  Musculoskeletal:     Right lower leg: No edema.     Left lower leg: No edema.  Neurological:     General: No focal deficit present.  Mental Status: She is alert and oriented to person, place, and time.  Psychiatric:        Mood and Affect: Mood normal.        The 10-year ASCVD risk score (Arnett DK, et al., 2019) is: 0.5%    Assessment & Plan:  Aaron AasAaron AasDashauna was seen today for medical management of chronic issues.  Diagnoses and all orders for this visit:  Chronic fatigue -     lisdexamfetamine (VYVANSE ) 60 MG capsule; Take 1 capsule (60 mg total) by mouth every morning. -     lisdexamfetamine (VYVANSE ) 60 MG capsule; Take 1 capsule (60 mg total) by mouth every morning. -     lisdexamfetamine (VYVANSE ) 60 MG capsule; Take 1 capsule (60 mg total) by mouth every morning.  Low libido  DUB (dysfunctional uterine bleeding)  Vitamin D  deficiency -     Vitamin D  (25 hydroxy)  Hypoactive sexual desire disorder  Hypersomnolence -     lisdexamfetamine (VYVANSE ) 60 MG capsule; Take 1 capsule (60 mg total) by mouth every morning. -     lisdexamfetamine (VYVANSE ) 60 MG capsule; Take 1 capsule (60 mg total) by mouth every morning. -     lisdexamfetamine (VYVANSE ) 60 MG capsule; Take 1 capsule (60 mg total) by mouth every morning.   Switch to vyvanse  Stop adderall  Follow up in 6 months  Addyi  too expensive Failed wellbutrin  For  HSSD  Continue metroprolol for dysautonomia   Check vitamin D  level today   Sandy Crumb, PA-C

## 2023-11-15 LAB — VITAMIN D 25 HYDROXY (VIT D DEFICIENCY, FRACTURES): Vit D, 25-Hydroxy: 37.7 ng/mL (ref 30.0–100.0)

## 2023-11-17 ENCOUNTER — Encounter: Payer: Self-pay | Admitting: Physician Assistant

## 2023-11-17 ENCOUNTER — Telehealth: Payer: Self-pay

## 2023-11-17 ENCOUNTER — Other Ambulatory Visit (HOSPITAL_COMMUNITY): Payer: Self-pay

## 2023-11-17 ENCOUNTER — Ambulatory Visit: Payer: Self-pay | Admitting: Physician Assistant

## 2023-11-17 NOTE — Progress Notes (Signed)
 Vitamin D  MUCH better. In normal range. Transition to 5000 units daily D3 OTC.

## 2023-11-17 NOTE — Telephone Encounter (Signed)
 Pharmacy Patient Advocate Encounter   Received notification from RX Request Messages that prior authorization for Addyi  100mg  tabs is required/requested.   Insurance verification completed.   The patient is insured through [facility] .   Per test claim: PA required; PA submitted to above mentioned insurance via CoverMyMeds Key/confirmation #/[id number] [facility] Status is pending

## 2023-11-18 ENCOUNTER — Other Ambulatory Visit (HOSPITAL_COMMUNITY): Payer: Self-pay

## 2023-11-18 NOTE — Telephone Encounter (Signed)
 I saw the order for the Addyi  was discontinued due to the cost of the medication.   I submitted the PA and if it is approved, the order can be sent to [facility] pharmacy. Per the representative there, if it is covered the copay for 30 days could be between $20 to $49 and if not it would cost $149 for 30 days and $297 for 90 days.   [facility] pharmacy - [phone]

## 2023-11-20 NOTE — Telephone Encounter (Signed)
 Pharmacy Patient Advocate Encounter  Received notification from [employer] that Prior Authorization for Addyi  100mg  tabs has been DENIED.  See denial reason below. No denial letter attached in CMM. Will attach denial letter to Media tab once received.   PA #/Case ID/Reference #: [id number]

## 2023-11-21 NOTE — Telephone Encounter (Signed)
 PA was denied for Addyi . Patient has been updated of the insurance outcome via a MyChart message.

## 2023-11-26 ENCOUNTER — Other Ambulatory Visit (HOSPITAL_COMMUNITY): Payer: Self-pay

## 2023-11-26 ENCOUNTER — Other Ambulatory Visit (HOSPITAL_BASED_OUTPATIENT_CLINIC_OR_DEPARTMENT_OTHER): Payer: Self-pay

## 2023-12-04 ENCOUNTER — Other Ambulatory Visit (HOSPITAL_BASED_OUTPATIENT_CLINIC_OR_DEPARTMENT_OTHER): Payer: Self-pay

## 2023-12-04 ENCOUNTER — Other Ambulatory Visit: Payer: Self-pay

## 2024-01-05 ENCOUNTER — Other Ambulatory Visit (HOSPITAL_BASED_OUTPATIENT_CLINIC_OR_DEPARTMENT_OTHER): Payer: Self-pay

## 2024-02-05 ENCOUNTER — Other Ambulatory Visit: Payer: Self-pay | Admitting: Physician Assistant

## 2024-02-05 ENCOUNTER — Other Ambulatory Visit (HOSPITAL_BASED_OUTPATIENT_CLINIC_OR_DEPARTMENT_OTHER): Payer: Self-pay

## 2024-02-05 ENCOUNTER — Other Ambulatory Visit: Payer: Self-pay

## 2024-02-05 DIAGNOSIS — R5382 Chronic fatigue, unspecified: Secondary | ICD-10-CM

## 2024-02-05 DIAGNOSIS — G4710 Hypersomnia, unspecified: Secondary | ICD-10-CM

## 2024-02-10 ENCOUNTER — Ambulatory Visit (INDEPENDENT_AMBULATORY_CARE_PROVIDER_SITE_OTHER): Admitting: Physician Assistant

## 2024-02-10 ENCOUNTER — Other Ambulatory Visit (HOSPITAL_BASED_OUTPATIENT_CLINIC_OR_DEPARTMENT_OTHER): Payer: Self-pay

## 2024-02-10 ENCOUNTER — Encounter: Payer: Self-pay | Admitting: Physician Assistant

## 2024-02-10 ENCOUNTER — Other Ambulatory Visit: Payer: Self-pay

## 2024-02-10 VITALS — BP 120/79 | HR 114 | Ht 66.0 in | Wt 146.0 lb

## 2024-02-10 DIAGNOSIS — Z13220 Encounter for screening for lipoid disorders: Secondary | ICD-10-CM | POA: Diagnosis not present

## 2024-02-10 DIAGNOSIS — Z0000 Encounter for general adult medical examination without abnormal findings: Secondary | ICD-10-CM

## 2024-02-10 DIAGNOSIS — G9332 Myalgic encephalomyelitis/chronic fatigue syndrome: Secondary | ICD-10-CM

## 2024-02-10 DIAGNOSIS — M25561 Pain in right knee: Secondary | ICD-10-CM

## 2024-02-10 DIAGNOSIS — G901 Familial dysautonomia [Riley-Day]: Secondary | ICD-10-CM | POA: Diagnosis not present

## 2024-02-10 DIAGNOSIS — E559 Vitamin D deficiency, unspecified: Secondary | ICD-10-CM | POA: Diagnosis not present

## 2024-02-10 DIAGNOSIS — G4710 Hypersomnia, unspecified: Secondary | ICD-10-CM | POA: Diagnosis not present

## 2024-02-10 DIAGNOSIS — G8929 Other chronic pain: Secondary | ICD-10-CM

## 2024-02-10 DIAGNOSIS — Z131 Encounter for screening for diabetes mellitus: Secondary | ICD-10-CM

## 2024-02-10 MED ORDER — AMPHETAMINE-DEXTROAMPHET ER 30 MG PO CP24
30.0000 mg | ORAL_CAPSULE | ORAL | 0 refills | Status: DC
  Filled 2024-04-05: qty 30, 30d supply, fill #0

## 2024-02-10 MED ORDER — AMPHETAMINE-DEXTROAMPHET ER 30 MG PO CP24: 30.0000 mg | ORAL_CAPSULE | ORAL | 0 refills | Status: DC

## 2024-02-10 MED ORDER — ATENOLOL 25 MG PO TABS
25.0000 mg | ORAL_TABLET | Freq: Every day | ORAL | 3 refills | Status: AC
  Filled 2024-02-10 – 2024-03-04 (×2): qty 90, 90d supply, fill #0
  Filled 2024-06-07: qty 90, 90d supply, fill #1

## 2024-02-10 MED ORDER — AMPHETAMINE-DEXTROAMPHET ER 30 MG PO CP24
30.0000 mg | ORAL_CAPSULE | ORAL | 0 refills | Status: DC
  Filled 2024-02-10 – 2024-03-04 (×2): qty 30, 30d supply, fill #0

## 2024-02-10 NOTE — Patient Instructions (Signed)
 Will order MRI of right knee  Health Maintenance, Female Adopting a healthy lifestyle and getting preventive care are important in promoting health and wellness. Ask your health care provider about: The right schedule for you to have regular tests and exams. Things you can do on your own to prevent diseases and keep yourself healthy. What should I know about diet, weight, and exercise? Eat a healthy diet  Eat a diet that includes plenty of vegetables, fruits, low-fat dairy products, and lean protein. Do not eat a lot of foods that are high in solid fats, added sugars, or sodium. Maintain a healthy weight Body mass index (BMI) is used to identify weight problems. It estimates body fat based on height and weight. Your health care provider can help determine your BMI and help you achieve or maintain a healthy weight. Get regular exercise Get regular exercise. This is one of the most important things you can do for your health. Most adults should: Exercise for at least 150 minutes each week. The exercise should increase your heart rate and make you sweat (moderate-intensity exercise). Do strengthening exercises at least twice a week. This is in addition to the moderate-intensity exercise. Spend less time sitting. Even light physical activity can be beneficial. Watch cholesterol and blood lipids Have your blood tested for lipids and cholesterol at 42 years of age, then have this test every 5 years. Have your cholesterol levels checked more often if: Your lipid or cholesterol levels are high. You are older than 42 years of age. You are at high risk for heart disease. What should I know about cancer screening? Depending on your health history and family history, you may need to have cancer screening at various ages. This may include screening for: Breast cancer. Cervical cancer. Colorectal cancer. Skin cancer. Lung cancer. What should I know about heart disease, diabetes, and high blood  pressure? Blood pressure and heart disease High blood pressure causes heart disease and increases the risk of stroke. This is more likely to develop in people who have high blood pressure readings or are overweight. Have your blood pressure checked: Every 3-5 years if you are 46-26 years of age. Every year if you are 64 years old or older. Diabetes Have regular diabetes screenings. This checks your fasting blood sugar level. Have the screening done: Once every three years after age 75 if you are at a normal weight and have a low risk for diabetes. More often and at a younger age if you are overweight or have a high risk for diabetes. What should I know about preventing infection? Hepatitis B If you have a higher risk for hepatitis B, you should be screened for this virus. Talk with your health care provider to find out if you are at risk for hepatitis B infection. Hepatitis C Testing is recommended for: Everyone born from [birth year] through 1964. Anyone with known risk factors for hepatitis C. Sexually transmitted infections (STIs) Get screened for STIs, including gonorrhea and chlamydia, if: You are sexually active and are younger than 42 years of age. You are older than 42 years of age and your health care provider tells you that you are at risk for this type of infection. Your sexual activity has changed since you were last screened, and you are at increased risk for chlamydia or gonorrhea. Ask your health care provider if you are at risk. Ask your health care provider about whether you are at high risk for HIV. Your health care provider may recommend  a prescription medicine to help prevent HIV infection. If you choose to take medicine to prevent HIV, you should first get tested for HIV. You should then be tested every 3 months for as long as you are taking the medicine. Pregnancy If you are about to stop having your period (premenopausal) and you may become pregnant, seek counseling before you  get pregnant. Take 400 to 800 micrograms (mcg) of folic acid  every day if you become pregnant. Ask for birth control (contraception) if you want to prevent pregnancy. Osteoporosis and menopause Osteoporosis is a disease in which the bones lose minerals and strength with aging. This can result in bone fractures. If you are 36 years old or older, or if you are at risk for osteoporosis and fractures, ask your health care provider if you should: Be screened for bone loss. Take a calcium or vitamin D  supplement to lower your risk of fractures. Be given hormone replacement therapy (HRT) to treat symptoms of menopause. Follow these instructions at home: Alcohol use Do not drink alcohol if: Your health care provider tells you not to drink. You are pregnant, may be pregnant, or are planning to become pregnant. If you drink alcohol: Limit how much you have to: 0-1 drink a day. Know how much alcohol is in your drink. In the [place] one drink equals one 12 oz bottle of beer (355 mL), one 5 oz glass of wine (148 mL), or one 1 oz glass of hard liquor (44 mL). Lifestyle Do not use any products that contain nicotine or tobacco. These products include cigarettes, chewing tobacco, and vaping devices, such as e-cigarettes. If you need help quitting, ask your health care provider. Do not use street drugs. Do not share needles. Ask your health care provider for help if you need support or information about quitting drugs. General instructions Schedule regular health, dental, and eye exams. Stay current with your vaccines. Tell your health care provider if: You often feel depressed. You have ever been abused or do not feel safe at home. Summary Adopting a healthy lifestyle and getting preventive care are important in promoting health and wellness. Follow your health care provider's instructions about healthy diet, exercising, and getting tested or screened for diseases. Follow your health care provider's  instructions on monitoring your cholesterol and blood pressure. This information is not intended to replace advice given to you by your health care provider. Make sure you discuss any questions you have with your health care provider. Document Revised: 10/23/2020 Document Reviewed: 10/23/2020 Elsevier Patient Education  2024 [employer].

## 2024-02-10 NOTE — Progress Notes (Unsigned)
   Complete physical exam  Patient: Teresa Garcia   DOB: 30-Aug-1980   43 y.o. Female  MRN: [id number]  Subjective:    No chief complaint on file.   Arnetta Odeh is a 43 y.o. female who presents today for a complete physical exam. She reports consuming a {diet types:17450} diet. {types:19826} She generally feels {DESC; WELL/FAIRLY WELL/POORLY:18703}. She reports sleeping {DESC; WELL/FAIRLY WELL/POORLY:18703}. She {does/does not:200015} have additional problems to discuss today.    Most recent fall risk assessment:    08/11/2023   11:51 AM  Fall Risk   Falls in the past year? 0  Number falls in past yr: 0  Injury with Fall? 0  Risk for fall due to : History of fall(s)     Most recent depression screenings:    11/14/2023   11:17 AM 06/21/2022    7:46 AM  PHQ 2/9 Scores  PHQ - 2 Score 1 0  PHQ- 9 Score 3 6    {VISON DENTAL STD PSA (Optional):27386}  {History (Optional):23778}  Patient Care Team: Tuvia Woodrick L, PA-C as PCP - General (Family Medicine)   Outpatient Medications Prior to Visit  Medication Sig   atenolol  (TENORMIN ) 25 MG tablet Take 1 tablet (25 mg total) by mouth daily.   lisdexamfetamine (VYVANSE ) 60 MG capsule Take 1 capsule (60 mg total) by mouth every morning.   lisdexamfetamine (VYVANSE ) 60 MG capsule Take 1 capsule (60 mg total) by mouth every morning.   lisdexamfetamine (VYVANSE ) 60 MG capsule Take 1 capsule (60 mg total) by mouth every morning.   spironolactone  (ALDACTONE ) 50 MG tablet Take 1 tablet (50 mg total) by mouth daily.   Vitamin D , Ergocalciferol , (DRISDOL ) 1.25 MG (50000 UNIT) CAPS capsule Take 1 capsule (50,000 Units total) by mouth every 7 (seven) days. Take for 8 total doses(weeks)   No facility-administered medications prior to visit.    ROS        Objective:     BP 120/79   Pulse (!) 114   Ht 5' 6 (1.676 m)   Wt 146 lb (66.2 kg)   SpO2 99%   BMI 23.57 kg/m  {Vitals History (Optional):23777}  Physical Exam   No  results found for any visits on 02/10/24. {Show previous labs (optional):23779}    Assessment & Plan:    Routine Health Maintenance and Physical Exam  Immunization History  Administered Date(s) Administered   HPV 9-valent 05/16/2022, 07/16/2022, 11/14/2022   Influenza,inj,Quad PF,6+ Mos 03/29/2019, 03/22/2022   Influenza-Unspecified 03/15/2017, 03/31/2018, 04/05/2020, 04/14/2021   Tdap 12/17/2019    Health Maintenance  Topic Date Due   COVID-19 Vaccine (1 - 2024-25 season) 02/26/2024 (Originally 02/16/2023)   INFLUENZA VACCINE  09/14/2024 (Originally 01/16/2024)   Hepatitis B Vaccines 19-59 Average Risk (1 of 3 - 19+ 3-dose series) 02/09/2025 (Originally 01/03/2001)   Cervical Cancer Screening (HPV/Pap Cotest)  01/22/2028   DTaP/Tdap/Td (2 - Td or Tdap) 12/16/2029   HPV VACCINES  Completed   Hepatitis C Screening  Completed   HIV Screening  Completed   Pneumococcal Vaccine  Aged Out   Meningococcal B Vaccine  Aged Out    Discussed health benefits of physical activity, and encouraged her to engage in regular exercise appropriate for her age and condition.  Problem List Items Addressed This Visit   None  No follow-ups on file.     Terran Hollenkamp, PA-C

## 2024-02-11 ENCOUNTER — Telehealth: Payer: Self-pay

## 2024-02-11 ENCOUNTER — Other Ambulatory Visit (HOSPITAL_BASED_OUTPATIENT_CLINIC_OR_DEPARTMENT_OTHER): Payer: Self-pay

## 2024-02-11 NOTE — Telephone Encounter (Signed)
 Called pt to see if she had a follow up mammogram after 2023, left detailed message and contact information for her to return call back to clinic

## 2024-02-13 ENCOUNTER — Encounter: Payer: Self-pay | Admitting: Physician Assistant

## 2024-02-17 ENCOUNTER — Ambulatory Visit

## 2024-02-17 DIAGNOSIS — M25561 Pain in right knee: Secondary | ICD-10-CM

## 2024-02-17 DIAGNOSIS — G8929 Other chronic pain: Secondary | ICD-10-CM

## 2024-02-18 ENCOUNTER — Ambulatory Visit: Payer: Self-pay | Admitting: Physician Assistant

## 2024-02-18 NOTE — Progress Notes (Signed)
 All ligaments and meniscus look GREAT. No explanation of knee pain.

## 2024-02-20 ENCOUNTER — Other Ambulatory Visit (HOSPITAL_BASED_OUTPATIENT_CLINIC_OR_DEPARTMENT_OTHER): Payer: Self-pay

## 2024-02-24 ENCOUNTER — Other Ambulatory Visit (HOSPITAL_BASED_OUTPATIENT_CLINIC_OR_DEPARTMENT_OTHER): Payer: Self-pay

## 2024-03-04 ENCOUNTER — Other Ambulatory Visit (HOSPITAL_BASED_OUTPATIENT_CLINIC_OR_DEPARTMENT_OTHER): Payer: Self-pay

## 2024-03-26 ENCOUNTER — Other Ambulatory Visit: Payer: Self-pay | Admitting: *Deleted

## 2024-03-26 DIAGNOSIS — Z006 Encounter for examination for normal comparison and control in clinical research program: Secondary | ICD-10-CM

## 2024-04-05 ENCOUNTER — Other Ambulatory Visit (HOSPITAL_BASED_OUTPATIENT_CLINIC_OR_DEPARTMENT_OTHER): Payer: Self-pay

## 2024-04-07 ENCOUNTER — Ambulatory Visit: Admitting: Obstetrics and Gynecology

## 2024-05-03 ENCOUNTER — Encounter: Payer: Self-pay | Admitting: Physician Assistant

## 2024-05-03 NOTE — Telephone Encounter (Signed)
 Sure

## 2024-05-07 ENCOUNTER — Telehealth: Admitting: Physician Assistant

## 2024-05-07 ENCOUNTER — Other Ambulatory Visit (HOSPITAL_BASED_OUTPATIENT_CLINIC_OR_DEPARTMENT_OTHER): Payer: Self-pay

## 2024-05-07 DIAGNOSIS — G9332 Myalgic encephalomyelitis/chronic fatigue syndrome: Secondary | ICD-10-CM | POA: Diagnosis not present

## 2024-05-07 DIAGNOSIS — G4710 Hypersomnia, unspecified: Secondary | ICD-10-CM | POA: Diagnosis not present

## 2024-05-07 MED ORDER — AMPHETAMINE-DEXTROAMPHET ER 30 MG PO CP24
30.0000 mg | ORAL_CAPSULE | ORAL | 0 refills | Status: AC
  Filled 2024-05-07: qty 30, 30d supply, fill #0

## 2024-05-07 MED ORDER — AMPHETAMINE-DEXTROAMPHET ER 30 MG PO CP24
30.0000 mg | ORAL_CAPSULE | ORAL | 0 refills | Status: AC
  Filled 2024-07-07: qty 30, 30d supply, fill #0

## 2024-05-07 MED ORDER — AMPHETAMINE-DEXTROAMPHET ER 30 MG PO CP24
30.0000 mg | ORAL_CAPSULE | ORAL | 0 refills | Status: AC
  Filled 2024-06-07: qty 30, 30d supply, fill #0

## 2024-05-10 ENCOUNTER — Encounter: Payer: Self-pay | Admitting: Physician Assistant

## 2024-05-10 NOTE — Progress Notes (Signed)
..  Virtual Visit via Video Note  I connected with Teresa Garcia on 05/07/2024 at 10:30 AM EST by a video enabled telemedicine application and verified that I am speaking with the correct person using two identifiers.  Location: Patient: home Provider: Clinic  .SABRAParticipating in visit:  Patient: Teresa Garcia Provider: Vermell Bologna PA-C   I discussed the limitations of evaluation and management by telemedicine and the availability of in person appointments. The patient expressed understanding and agreed to proceed.  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 She is doing well. No concerns. No side effects. Taking adderall  daily. Needs refills.     Observations/Objective: No acute distress Normal mood and appearance   Assessment and Plan: .Diagnoses and all orders for this visit:  Chronic fatigue syndrome -     amphetamine -dextroamphetamine  (ADDERALL  XR) 30 MG 24 hr capsule; Take 1 capsule (30 mg total) by mouth every morning. -     amphetamine -dextroamphetamine  (ADDERALL  XR) 30 MG 24 hr capsule; Take 1 capsule (30 mg total) by mouth every morning. -     amphetamine -dextroamphetamine  (ADDERALL  XR) 30 MG 24 hr capsule; Take 1 capsule (30 mg total) by mouth every morning.  Hypersomnolence -     amphetamine -dextroamphetamine  (ADDERALL  XR) 30 MG 24 hr capsule; Take 1 capsule (30 mg total) by mouth every morning. -     amphetamine -dextroamphetamine  (ADDERALL  XR) 30 MG 24 hr capsule; Take 1 capsule (30 mg total) by mouth every morning. -     amphetamine -dextroamphetamine  (ADDERALL  XR) 30 MG 24 hr capsule; Take 1 capsule (30 mg total) by mouth every morning.   Stable symptoms Refilled Adderall  Doing well Follow up in 6 months   Follow Up Instructions:    I discussed the assessment and treatment plan with the patient. The patient was provided an opportunity to ask questions and all  were answered. The patient agreed with the plan and demonstrated an understanding of the instructions.   The patient was advised to call back or seek an in-person evaluation if the symptoms worsen or if the condition fails to improve as anticipated.    Charlize Hathaway, PA-C

## 2024-06-07 ENCOUNTER — Other Ambulatory Visit: Payer: Self-pay

## 2024-06-07 ENCOUNTER — Other Ambulatory Visit (HOSPITAL_BASED_OUTPATIENT_CLINIC_OR_DEPARTMENT_OTHER): Payer: Self-pay

## 2024-06-07 ENCOUNTER — Other Ambulatory Visit: Payer: Self-pay | Admitting: Physician Assistant

## 2024-06-07 DIAGNOSIS — G4710 Hypersomnia, unspecified: Secondary | ICD-10-CM

## 2024-06-07 DIAGNOSIS — G9332 Myalgic encephalomyelitis/chronic fatigue syndrome: Secondary | ICD-10-CM

## 2024-06-15 NOTE — Telephone Encounter (Signed)
 Refills on file available 06/06/2024 and 07/06/2024. Please have patient contact her pharmacy to have her available prescription filled for pickup.

## 2024-06-22 ENCOUNTER — Other Ambulatory Visit (HOSPITAL_BASED_OUTPATIENT_CLINIC_OR_DEPARTMENT_OTHER): Payer: Self-pay

## 2024-07-08 ENCOUNTER — Other Ambulatory Visit (HOSPITAL_BASED_OUTPATIENT_CLINIC_OR_DEPARTMENT_OTHER): Payer: Self-pay
